# Patient Record
Sex: Male | Born: 1980 | Hispanic: Yes | Marital: Married | Smoking: Never smoker
Health system: Southern US, Community
[De-identification: ages and names within clinical notes are randomized; demographics above are authoritative.]

## PROBLEM LIST (undated history)

## (undated) DIAGNOSIS — N289 Disorder of kidney and ureter, unspecified: Secondary | ICD-10-CM

## (undated) HISTORY — PX: TONSILLECTOMY: SUR1361

---

## 2019-06-14 ENCOUNTER — Other Ambulatory Visit: Payer: Self-pay

## 2019-06-14 DIAGNOSIS — Z20822 Contact with and (suspected) exposure to covid-19: Secondary | ICD-10-CM

## 2019-06-21 ENCOUNTER — Telehealth (HOSPITAL_COMMUNITY): Payer: Self-pay | Admitting: Emergency Medicine

## 2019-06-21 NOTE — Telephone Encounter (Signed)
Received report from [place] with positive result for Austin Glover, pt states he was contacted by the testing center already about his positive covid results.

## 2020-02-09 ENCOUNTER — Ambulatory Visit: Payer: Self-pay | Attending: Internal Medicine

## 2020-02-09 DIAGNOSIS — Z23 Encounter for immunization: Secondary | ICD-10-CM

## 2020-02-09 NOTE — Progress Notes (Signed)
   Covid-19 Vaccination Clinic  Name:  Clearence Vitug    MRN: [id number] DOB: [birth year]  02/09/2020  Mr. Genia Hotter was observed post Covid-19 immunization for 15 minutes without incident. He was provided with Vaccine Information Sheet and instruction to access the V-Safe system.   Mr. Genia Hotter was instructed to call 911 with any severe reactions post vaccine: Marland Kitchen Difficulty breathing  . Swelling of face and throat  . A fast heartbeat  . A bad rash all over body  . Dizziness and weakness   Immunizations Administered    Name Date Dose VIS Date Route   Pfizer COVID-19 Vaccine 02/09/2020 10:29 AM 0.3 mL 11/01/2019 Intramuscular   Manufacturer: [employer], [employer]   Lot: [id number]   NDC: [address]-8

## 2020-03-01 ENCOUNTER — Ambulatory Visit: Payer: Self-pay | Attending: Internal Medicine

## 2020-03-01 DIAGNOSIS — Z23 Encounter for immunization: Secondary | ICD-10-CM

## 2020-03-01 NOTE — Progress Notes (Signed)
   Covid-19 Vaccination Clinic  Name:  Noe Goyer    MRN: [id number] DOB: 1981-03-11  03/01/2020  Mr. Genia Hotter was observed post Covid-19 immunization for 15 minutes without incident. He was provided with Vaccine Information Sheet and instruction to access the V-Safe system.   Mr. Genia Hotter was instructed to call 911 with any severe reactions post vaccine: Marland Kitchen Difficulty breathing  . Swelling of face and throat  . A fast heartbeat  . A bad rash all over body  . Dizziness and weakness   Immunizations Administered    Name Date Dose VIS Date Route   Pfizer COVID-19 Vaccine 03/01/2020 10:05 AM 0.3 mL 11/01/2019 Intramuscular   Manufacturer: [employer], [employer]   Lot: [id number]   NDC: [address]-8

## 2020-06-04 ENCOUNTER — Other Ambulatory Visit (INDEPENDENT_AMBULATORY_CARE_PROVIDER_SITE_OTHER): Payer: Self-pay | Admitting: Vascular Surgery

## 2020-06-04 ENCOUNTER — Observation Stay: Payer: Self-pay

## 2020-06-04 ENCOUNTER — Other Ambulatory Visit: Payer: Self-pay

## 2020-06-04 ENCOUNTER — Encounter
Admission: EM | Disposition: A | Discharge status: Home / Self Care | Payer: Self-pay | Source: Home / Self Care | Attending: Family Medicine

## 2020-06-04 ENCOUNTER — Inpatient Hospital Stay
Admission: EM | Admit: 2020-06-04 | Discharge: 2020-06-16 | DRG: 270 | Disposition: A | Discharge status: Home / Self Care | Payer: Self-pay | Attending: Family Medicine | Admitting: Family Medicine

## 2020-06-04 ENCOUNTER — Encounter: Payer: Self-pay | Admitting: Emergency Medicine

## 2020-06-04 ENCOUNTER — Emergency Department: Payer: Self-pay

## 2020-06-04 DIAGNOSIS — R52 Pain, unspecified: Secondary | ICD-10-CM

## 2020-06-04 DIAGNOSIS — Z8616 Personal history of COVID-19: Secondary | ICD-10-CM

## 2020-06-04 DIAGNOSIS — M79606 Pain in leg, unspecified: Secondary | ICD-10-CM | POA: Diagnosis present

## 2020-06-04 DIAGNOSIS — I998 Other disorder of circulatory system: Secondary | ICD-10-CM

## 2020-06-04 DIAGNOSIS — N50811 Right testicular pain: Secondary | ICD-10-CM | POA: Diagnosis not present

## 2020-06-04 DIAGNOSIS — N1330 Unspecified hydronephrosis: Secondary | ICD-10-CM | POA: Diagnosis present

## 2020-06-04 DIAGNOSIS — D6959 Other secondary thrombocytopenia: Secondary | ICD-10-CM | POA: Diagnosis present

## 2020-06-04 DIAGNOSIS — I82403 Acute embolism and thrombosis of unspecified deep veins of lower extremity, bilateral: Secondary | ICD-10-CM | POA: Diagnosis present

## 2020-06-04 DIAGNOSIS — R918 Other nonspecific abnormal finding of lung field: Secondary | ICD-10-CM

## 2020-06-04 DIAGNOSIS — Z6836 Body mass index (BMI) 36.0-36.9, adult: Secondary | ICD-10-CM

## 2020-06-04 DIAGNOSIS — N135 Crossing vessel and stricture of ureter without hydronephrosis: Secondary | ICD-10-CM

## 2020-06-04 DIAGNOSIS — N261 Atrophy of kidney (terminal): Secondary | ICD-10-CM | POA: Diagnosis present

## 2020-06-04 DIAGNOSIS — N179 Acute kidney failure, unspecified: Secondary | ICD-10-CM | POA: Diagnosis present

## 2020-06-04 DIAGNOSIS — I5021 Acute systolic (congestive) heart failure: Secondary | ICD-10-CM | POA: Diagnosis present

## 2020-06-04 DIAGNOSIS — I739 Peripheral vascular disease, unspecified: Secondary | ICD-10-CM

## 2020-06-04 DIAGNOSIS — I70229 Atherosclerosis of native arteries of extremities with rest pain, unspecified extremity: Secondary | ICD-10-CM

## 2020-06-04 DIAGNOSIS — E669 Obesity, unspecified: Secondary | ICD-10-CM | POA: Diagnosis present

## 2020-06-04 DIAGNOSIS — D62 Acute posthemorrhagic anemia: Secondary | ICD-10-CM | POA: Diagnosis not present

## 2020-06-04 DIAGNOSIS — D72829 Elevated white blood cell count, unspecified: Secondary | ICD-10-CM | POA: Diagnosis not present

## 2020-06-04 DIAGNOSIS — I2699 Other pulmonary embolism without acute cor pulmonale: Secondary | ICD-10-CM | POA: Diagnosis present

## 2020-06-04 DIAGNOSIS — Z833 Family history of diabetes mellitus: Secondary | ICD-10-CM

## 2020-06-04 DIAGNOSIS — I82493 Acute embolism and thrombosis of other specified deep vein of lower extremity, bilateral: Principal | ICD-10-CM | POA: Diagnosis present

## 2020-06-04 DIAGNOSIS — R000 Tachycardia, unspecified: Secondary | ICD-10-CM | POA: Diagnosis not present

## 2020-06-04 DIAGNOSIS — N131 Hydronephrosis with ureteral stricture, not elsewhere classified: Secondary | ICD-10-CM | POA: Diagnosis present

## 2020-06-04 HISTORY — PX: LOWER EXTREMITY ANGIOGRAPHY: CATH118251

## 2020-06-04 LAB — APTT: aPTT: 31 seconds (ref 24–36)

## 2020-06-04 LAB — COMPREHENSIVE METABOLIC PANEL
ALT: 33 U/L (ref 0–44)
AST: 32 U/L (ref 15–41)
Albumin: 4.5 g/dL (ref 3.5–5.0)
Alkaline Phosphatase: 90 U/L (ref 38–126)
Anion gap: 20 — ABNORMAL HIGH (ref 5–15)
BUN: 22 mg/dL — ABNORMAL HIGH (ref 6–20)
CO2: 10 mmol/L — ABNORMAL LOW (ref 22–32)
Calcium: 8.7 mg/dL — ABNORMAL LOW (ref 8.9–10.3)
Chloride: 107 mmol/L (ref 98–111)
Creatinine, Ser: 1.89 mg/dL — ABNORMAL HIGH (ref 0.61–1.24)
GFR calc Af Amer: 51 mL/min — ABNORMAL LOW (ref >60)
GFR calc non Af Amer: 44 mL/min — ABNORMAL LOW (ref >60)
Glucose, Bld: 259 mg/dL — ABNORMAL HIGH (ref 70–99)
Potassium: 4 mmol/L (ref 3.5–5.1)
Sodium: 137 mmol/L (ref 135–145)
Total Bilirubin: 1.2 mg/dL (ref 0.3–1.2)
Total Protein: 8.2 g/dL — ABNORMAL HIGH (ref 6.5–8.1)

## 2020-06-04 LAB — SARS CORONAVIRUS 2 BY RT PCR (HOSPITAL ORDER, PERFORMED IN ~~LOC~~ HOSPITAL LAB): SARS Coronavirus 2: NEGATIVE

## 2020-06-04 LAB — CBC WITH DIFFERENTIAL/PLATELET
Abs Immature Granulocytes: 0.21 10*3/uL — ABNORMAL HIGH (ref 0.00–0.07)
Basophils Absolute: 0.1 10*3/uL (ref 0.0–0.1)
Basophils Relative: 0 %
Eosinophils Absolute: 0.1 10*3/uL (ref 0.0–0.5)
Eosinophils Relative: 1 %
HCT: 47.4 % (ref 39.0–52.0)
Hemoglobin: 16 g/dL (ref 13.0–17.0)
Immature Granulocytes: 1 %
Lymphocytes Relative: 18 %
Lymphs Abs: 3.5 10*3/uL (ref 0.7–4.0)
MCH: 29.8 pg (ref 26.0–34.0)
MCHC: 33.8 g/dL (ref 30.0–36.0)
MCV: 88.3 fL (ref 80.0–100.0)
Monocytes Absolute: 1.1 10*3/uL — ABNORMAL HIGH (ref 0.1–1.0)
Monocytes Relative: 6 %
Neutro Abs: 14 10*3/uL — ABNORMAL HIGH (ref 1.7–7.7)
Neutrophils Relative %: 74 %
Platelets: 178 10*3/uL (ref 150–400)
RBC: 5.37 MIL/uL (ref 4.22–5.81)
RDW: 12.2 % (ref 11.5–15.5)
WBC: 18.9 10*3/uL — ABNORMAL HIGH (ref 4.0–10.5)
nRBC: 0 % (ref 0.0–0.2)

## 2020-06-04 LAB — HEPARIN LEVEL (UNFRACTIONATED): Heparin Unfractionated: 0.6 IU/mL (ref 0.30–0.70)

## 2020-06-04 LAB — C-REACTIVE PROTEIN: CRP: 0.9 mg/dL (ref <1.0)

## 2020-06-04 LAB — LACTIC ACID, PLASMA
Lactic Acid, Venous: 5.1 mmol/L (ref 0.5–1.9)
Lactic Acid, Venous: 1.8 mmol/L (ref 0.5–1.9)

## 2020-06-04 LAB — PROTIME-INR
INR: 1.2 (ref 0.8–1.2)
Prothrombin Time: 15.1 seconds (ref 11.4–15.2)

## 2020-06-04 LAB — CK: Total CK: 175 U/L (ref 49–397)

## 2020-06-04 LAB — GLUCOSE, CAPILLARY: Glucose-Capillary: 127 mg/dL — ABNORMAL HIGH (ref 70–99)

## 2020-06-04 IMAGING — CT CT ANGIO AOBIFEM WO/W CM
2 of 10 series · 11 of 46 positions shown, 15 images · IV contrast (APPLIED)
Comparison: None.

CLINICAL DATA: Acute back pain, bilateral leg pain, mottled lower
extremity

EXAM:
CT ANGIOGRAPHY OF ABDOMINAL AORTA WITH ILIOFEMORAL RUNOFF
TECHNIQUE: Multidetector CT imaging of the abdomen, pelvis and lower
extremities was performed using the standard protocol during bolus
administration of intravenous contrast. Multiplanar CT image
reconstructions and MIPs were obtained to evaluate the vascular
anatomy.
CONTRAST:  125mL OMNIPAQUE IOHEXOL 350 MG/ML SOLN

[Series 4: axial arterial upper · axial · arterial · 0.91mm/px · z∈[-700,-31]mm · 10 of 259 slices shown, 13 images]
[im 18/259  soft-tissue]
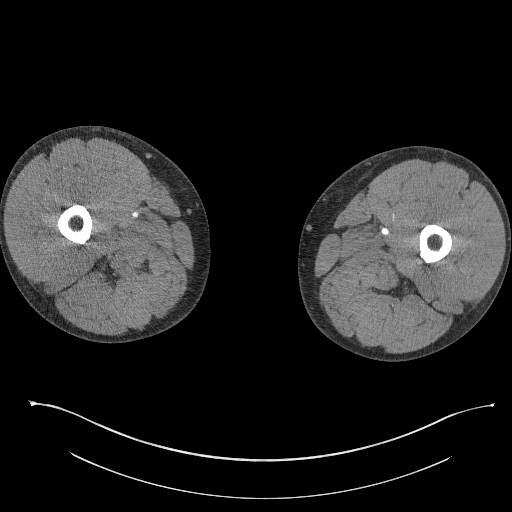
[im 18/259  bone]
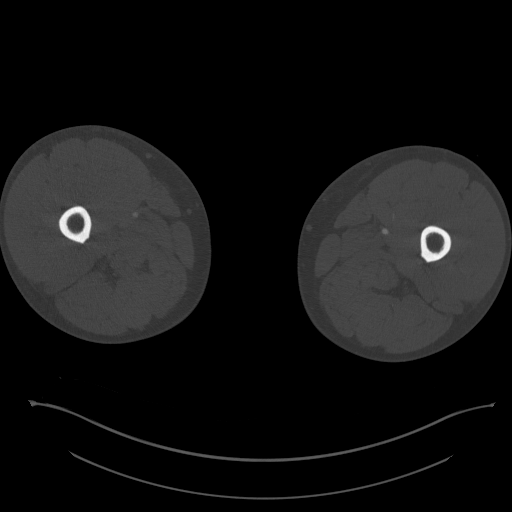
[im 52/259  soft-tissue]
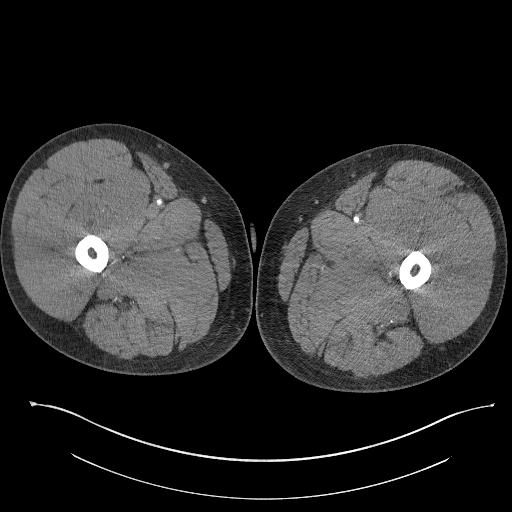
[im 87/259  soft-tissue]
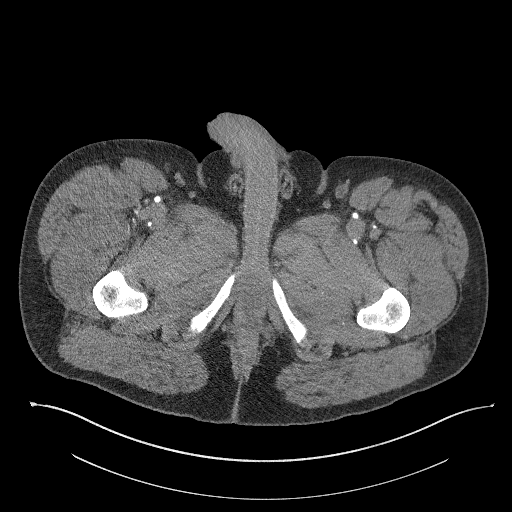
[im 121/259  soft-tissue]
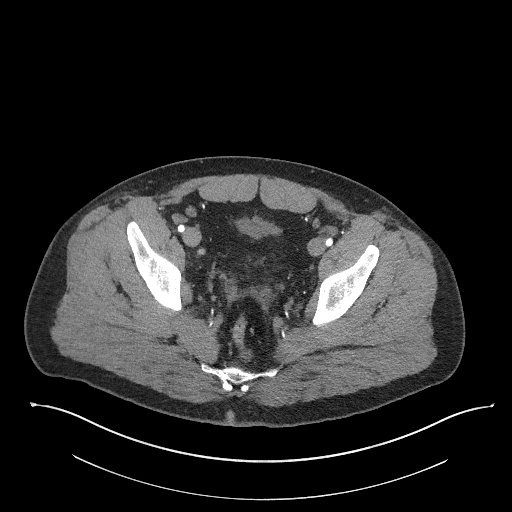
[im 138/259  soft-tissue]
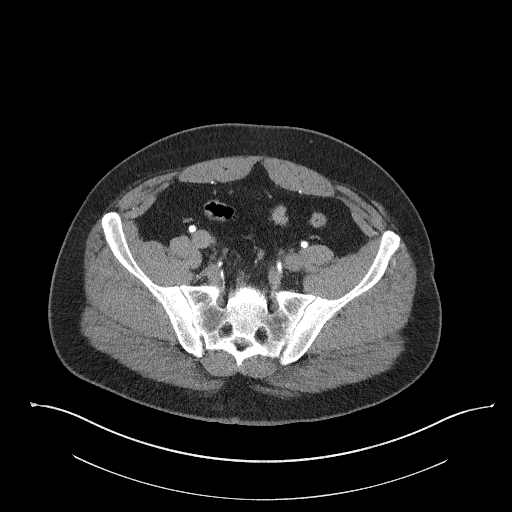
[im 173/259  soft-tissue]
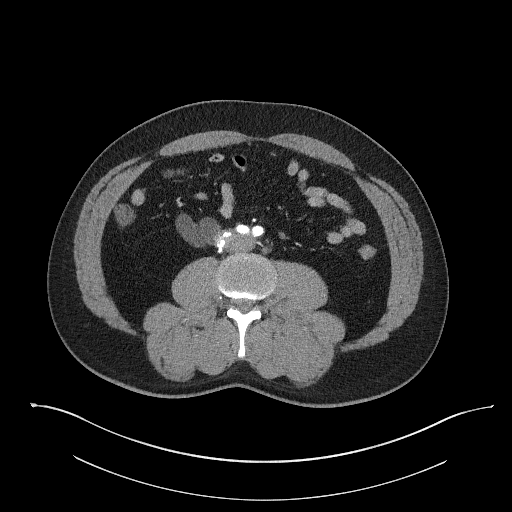
[im 190/259  lung]
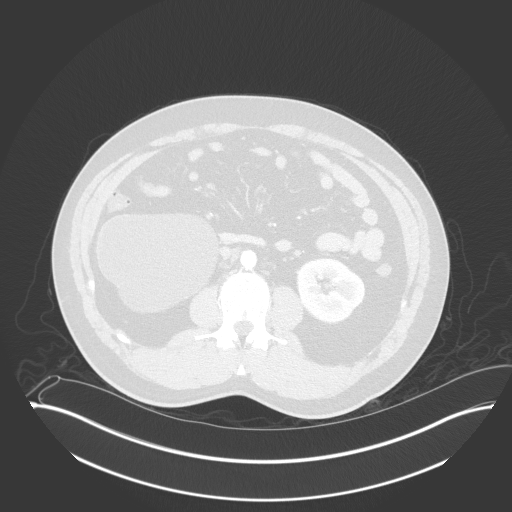
[im 207/259  soft-tissue]
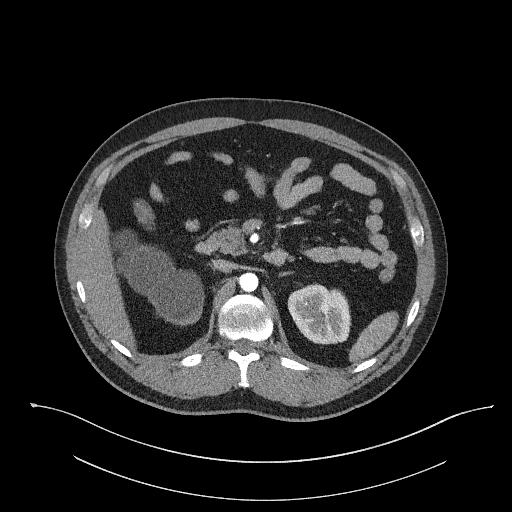
[im 207/259  lung]
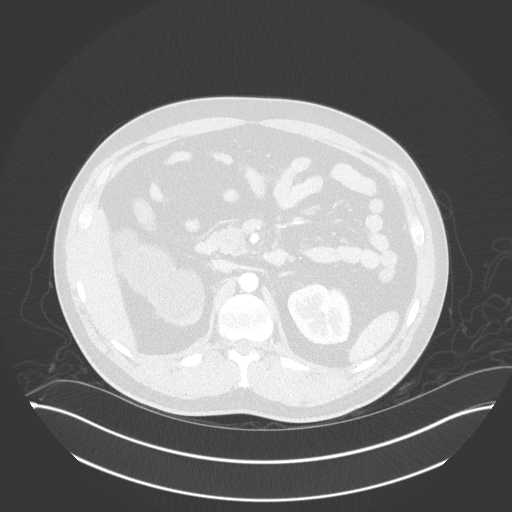
[im 224/259  lung]
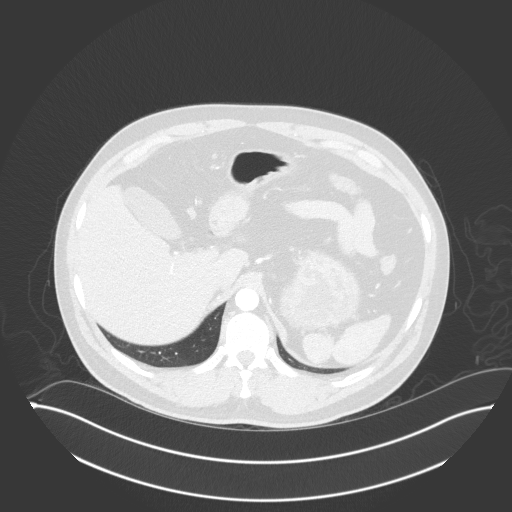
[im 241/259  soft-tissue]
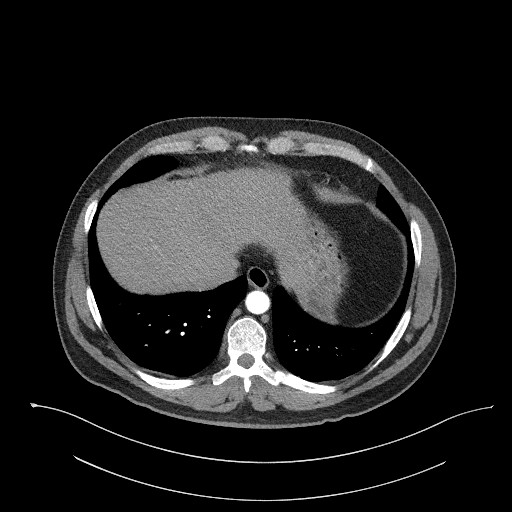
[im 241/259  lung]
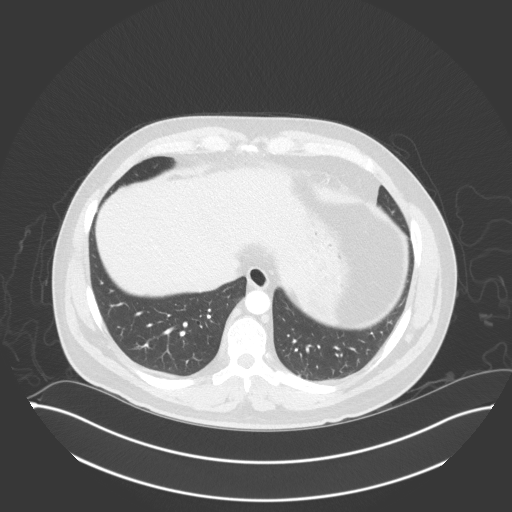

[Series 6: coronal upper · coronal · 0.92mm/px · 1 of 152 slices shown, 2 images]
[im 76/152  soft-tissue]
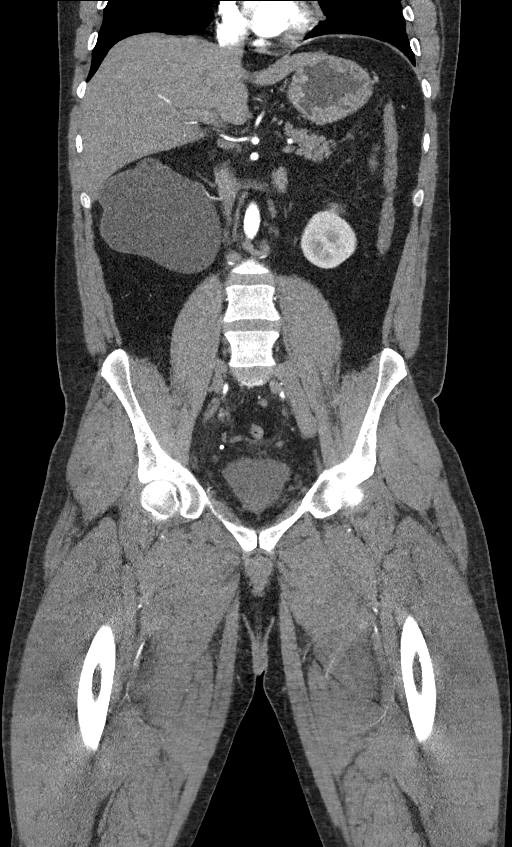
[im 76/152  bone]
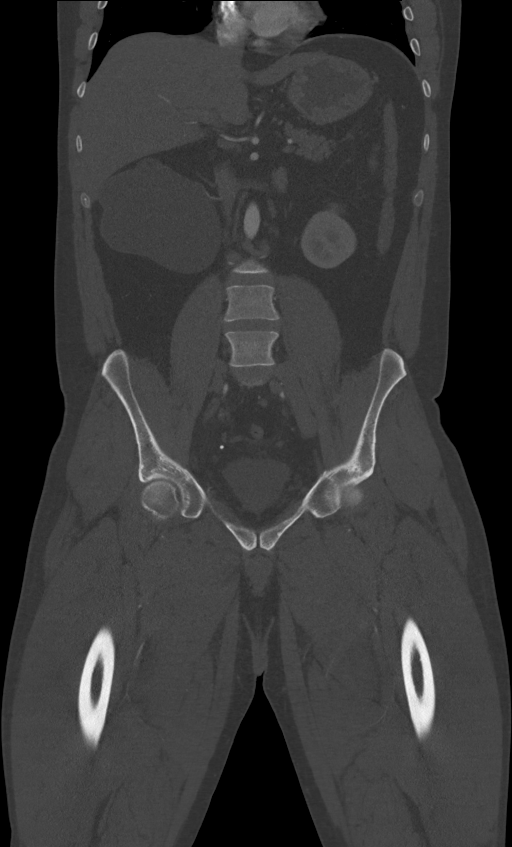

[11 of 46 positions shown; findings below may reference images not displayed]

FINDINGS: VASCULAR

Aorta: Normal caliber aorta without aneurysm, dissection, vasculitis
or significant stenosis.

Celiac: Patent without evidence of aneurysm, dissection, vasculitis
or significant stenosis.

SMA: Patent without evidence of aneurysm, dissection, vasculitis or
significant stenosis.

Renals: Single left renal artery, unremarkable. Single right renal
artery, diffusely diminutive in size.

IMA: Patent without evidence of aneurysm, dissection, vasculitis or
significant stenosis.

RIGHT Lower Extremity

Inflow: Common, internal and external iliac arteries are patent
without evidence of aneurysm, dissection, vasculitis or significant
stenosis.

Outflow: Common, superficial and profunda femoral arteries and the
proximal popliteal artery are patent without evidence of aneurysm,
dissection, vasculitis or significant stenosis. There is asymmetric
SFA opacification, left greater than right. There is incomplete
opacification of the distal right popliteal artery.

Runoff: No significant contrast opacification.

LEFT Lower Extremity

Inflow: Common, internal and external iliac arteries are patent
without evidence of aneurysm, dissection, vasculitis or significant
stenosis.

Outflow: Common, superficial and profunda femoral arteries and the
popliteal artery are patent without evidence of aneurysm,
dissection, vasculitis or significant stenosis.

Runoff: Patent 3 vessel runoff to the mid calf, incomplete
opacification distally presumably related to scan timing.

Veins: Dilated right left common iliac vein and iliocaval confluence
up to 3.2 cm diameter, with tapered narrowing of the IVC to 1.4 cm
in its infrarenal segment. Due to arterial scan timing, no venous
opacification to assess for thrombus.

Review of the MIP images confirms the above findings.

NON-VASCULAR

Lower chest: No pleural or pericardial effusion. Visualized lung
bases clear.

Hepatobiliary: No focal liver abnormality is seen. No gallstones,
gallbladder wall thickening, or biliary dilatation.

Pancreas: Unremarkable. No pancreatic ductal dilatation or
surrounding inflammatory changes.

Spleen: Normal in size.  Single small calcified granuloma.

Adrenals/Urinary Tract: Adrenal glands unremarkable. Left kidney
normal. There is massive right hydronephrosis and proximal
ureterectasis with advanced atrophy of the renal parenchyma, only a
thin rim of tissue evident. The right ureterectasis terminates at
the level of a right retroperitoneal soft tissue mass containing
coarse calcifications process abutting the right common iliac vein
and distal IVC. Distal right ureter decompressed. Urinary bladder
nondistended, mildly thick-walled. Thick-walled urachal remnant with
soft tissue nodularity containing 2 foci of calcification.

Stomach/Bowel: Stomach is partially distended by ingested fluid. The
small bowel is nondistended. normal appendix. The colon is
decompressed, unremarkable.

Lymphatic: Multiple prominent subcentimeter right external iliac,
bilateral common iliac, left para-aortic and aortocaval lymph nodes.
No mesenteric adenopathy.

Reproductive: Prostate is unremarkable.

Other: No ascites.  No free air.

Musculoskeletal: Soft tissue mass containing coarse calcifications
extending over a craniocaudal length of 7.2 cm, abutting right
common iliac vein and SAILAJA caval confluence, in corresponding to
transition point of proximal right ureterectasis. Left knee medial
compartment DJD. No fracture or worrisome bone lesion.
IMPRESSION: 1. No aortic dissection, occlusion or stenosis.
2. Asymmetric flow to lower extremities left greater than right,
limiting evaluation of distal right popliteal artery and runoff such
that acute embolus cannot be confidently excluded.
3. Dilated left common iliac vein and iliocaval confluence up to
cm diameter, with tapered narrowing of the IVC to 1.4 cm in its
infrarenal segment. Given the clinical history, bilateral lower
extremity venous ultrasound may be useful for further evaluation.
4. Right retroperitoneal soft tissue mass containing coarse
calcifications, abutting the right common iliac vein and distal IVC.
There is associated chronic obstructive right hydronephrosis and
proximal ureterectasis with advanced right renal atrophy, suggesting
chronic process. MR SAILAJA be useful for better soft tissue
characterization.
5. Thick-walled urachal remnant with soft tissue nodularity
containing 2 foci of calcification. Recommend urologic consultation.

## 2020-06-04 SURGERY — LOWER EXTREMITY ANGIOGRAPHY
Anesthesia: Moderate Sedation | Laterality: Right

## 2020-06-04 MED ORDER — FENTANYL CITRATE (PF) 100 MCG/2ML IJ SOLN
INTRAMUSCULAR | Status: AC
  Filled 2020-06-04: qty 4

## 2020-06-04 MED ORDER — OXYCODONE HCL 5 MG PO TABS
5.0000 mg | ORAL_TABLET | ORAL | Status: DC | PRN
  Administered 2020-06-04 – 2020-06-10 (×23): 10 mg via ORAL
  Filled 2020-06-04 (×23): qty 2

## 2020-06-04 MED ORDER — HYDROMORPHONE HCL 1 MG/ML IJ SOLN: 1.0000 mg | INTRAMUSCULAR | Status: DC | PRN

## 2020-06-04 MED ORDER — ASPIRIN EC 81 MG PO TBEC
81.0000 mg | DELAYED_RELEASE_TABLET | Freq: Every day | ORAL | Status: DC
  Administered 2020-06-05 – 2020-06-06 (×2): 81 mg via ORAL
  Filled 2020-06-04 (×2): qty 1

## 2020-06-04 MED ORDER — SODIUM CHLORIDE 0.9 % IV SOLN: INTRAVENOUS | Status: DC

## 2020-06-04 MED ORDER — HYDROMORPHONE HCL 1 MG/ML IJ SOLN
1.0000 mg | Freq: Once | INTRAMUSCULAR | Status: AC
  Administered 2020-06-04: 1 mg via INTRAVENOUS

## 2020-06-04 MED ORDER — HEPARIN (PORCINE) 25000 UT/250ML-% IV SOLN
1650.0000 [IU]/h | INTRAVENOUS | Status: DC
  Administered 2020-06-04: 1650 [IU]/h via INTRAVENOUS
  Filled 2020-06-04: qty 250

## 2020-06-04 MED ORDER — CEFAZOLIN SODIUM-DEXTROSE 1-4 GM/50ML-% IV SOLN
INTRAVENOUS | Status: AC
  Filled 2020-06-04: qty 50

## 2020-06-04 MED ORDER — FENTANYL CITRATE (PF) 100 MCG/2ML IJ SOLN
INTRAMUSCULAR | Status: DC | PRN
  Administered 2020-06-04: 50 ug via INTRAVENOUS

## 2020-06-04 MED ORDER — MIDAZOLAM HCL 5 MG/5ML IJ SOLN
INTRAMUSCULAR | Status: AC
  Filled 2020-06-04: qty 5

## 2020-06-04 MED ORDER — FENTANYL CITRATE (PF) 100 MCG/2ML IJ SOLN
INTRAMUSCULAR | Status: AC
  Administered 2020-06-04: 50 ug via INTRAVENOUS
  Filled 2020-06-04: qty 2

## 2020-06-04 MED ORDER — HEPARIN SODIUM (PORCINE) 1000 UNIT/ML IJ SOLN
INTRAMUSCULAR | Status: AC
  Filled 2020-06-04: qty 2

## 2020-06-04 MED ORDER — LACTATED RINGERS IV BOLUS
1000.0000 mL | Freq: Once | INTRAVENOUS | Status: AC
  Administered 2020-06-04: 1000 mL via INTRAVENOUS

## 2020-06-04 MED ORDER — HYDROMORPHONE HCL 1 MG/ML IJ SOLN
INTRAMUSCULAR | Status: AC
  Filled 2020-06-04: qty 1

## 2020-06-04 MED ORDER — CEFAZOLIN SODIUM-DEXTROSE 1-4 GM/50ML-% IV SOLN
1.0000 g | Freq: Once | INTRAVENOUS | Status: AC
  Administered 2020-06-04: 1 g via INTRAVENOUS

## 2020-06-04 MED ORDER — SODIUM CHLORIDE 0.9 % IV SOLN
2.0000 g | Freq: Once | INTRAVENOUS | Status: AC
  Administered 2020-06-04: 2 g via INTRAVENOUS
  Filled 2020-06-04 (×2): qty 2

## 2020-06-04 MED ORDER — METHYLPREDNISOLONE SODIUM SUCC 125 MG IJ SOLR: 125.0000 mg | Freq: Once | INTRAMUSCULAR | Status: DC | PRN

## 2020-06-04 MED ORDER — FAMOTIDINE 20 MG PO TABS: 40.0000 mg | ORAL_TABLET | Freq: Once | ORAL | Status: DC | PRN

## 2020-06-04 MED ORDER — ONDANSETRON HCL 4 MG/2ML IJ SOLN
4.0000 mg | Freq: Four times a day (QID) | INTRAMUSCULAR | Status: DC | PRN
  Administered 2020-06-04: 4 mg via INTRAVENOUS

## 2020-06-04 MED ORDER — MORPHINE SULFATE (PF) 2 MG/ML IV SOLN
2.0000 mg | INTRAVENOUS | Status: DC | PRN
  Administered 2020-06-05 (×3): 2 mg via INTRAVENOUS
  Filled 2020-06-04 (×3): qty 1

## 2020-06-04 MED ORDER — HYDROMORPHONE HCL 1 MG/ML IJ SOLN
1.0000 mg | Freq: Once | INTRAMUSCULAR | Status: AC
  Administered 2020-06-04: 1 mg via INTRAVENOUS
  Filled 2020-06-04: qty 1

## 2020-06-04 MED ORDER — FENTANYL CITRATE (PF) 100 MCG/2ML IJ SOLN: 50.0000 ug | Freq: Once | INTRAMUSCULAR | Status: AC

## 2020-06-04 MED ORDER — HYDROMORPHONE HCL 1 MG/ML IJ SOLN: 1.0000 mg | Freq: Once | INTRAMUSCULAR | Status: DC | PRN

## 2020-06-04 MED ORDER — IOHEXOL 350 MG/ML SOLN
125.0000 mL | Freq: Once | INTRAVENOUS | Status: AC | PRN
  Administered 2020-06-04: 125 mL via INTRAVENOUS
  Filled 2020-06-04: qty 125

## 2020-06-04 MED ORDER — HYDROMORPHONE HCL 1 MG/ML IJ SOLN
1.0000 mg | INTRAMUSCULAR | Status: DC | PRN
  Administered 2020-06-04 (×2): 1 mg via INTRAVENOUS
  Filled 2020-06-04 (×2): qty 1

## 2020-06-04 MED ORDER — INSULIN ASPART 100 UNIT/ML ~~LOC~~ SOLN
6.0000 [IU] | Freq: Once | SUBCUTANEOUS | Status: AC
  Administered 2020-06-04: 6 [IU] via INTRAVENOUS
  Filled 2020-06-04: qty 1

## 2020-06-04 MED ORDER — SODIUM CHLORIDE 0.9 % IV BOLUS
1000.0000 mL | Freq: Once | INTRAVENOUS | Status: AC
  Administered 2020-06-04: 1000 mL via INTRAVENOUS

## 2020-06-04 MED ORDER — DIPHENHYDRAMINE HCL 50 MG/ML IJ SOLN: 50.0000 mg | Freq: Once | INTRAMUSCULAR | Status: DC | PRN

## 2020-06-04 MED ORDER — VANCOMYCIN HCL IN DEXTROSE 1-5 GM/200ML-% IV SOLN
1000.0000 mg | Freq: Once | INTRAVENOUS | Status: AC
  Administered 2020-06-04: 1000 mg via INTRAVENOUS
  Filled 2020-06-04: qty 200

## 2020-06-04 MED ORDER — MIDAZOLAM HCL 2 MG/ML PO SYRP: 8.0000 mg | ORAL_SOLUTION | Freq: Once | ORAL | Status: DC | PRN

## 2020-06-04 MED ORDER — ONDANSETRON HCL 4 MG/2ML IJ SOLN
INTRAMUSCULAR | Status: AC
  Filled 2020-06-04: qty 2

## 2020-06-04 MED ORDER — METRONIDAZOLE IN NACL 5-0.79 MG/ML-% IV SOLN
500.0000 mg | Freq: Once | INTRAVENOUS | Status: AC
  Administered 2020-06-04: 500 mg via INTRAVENOUS
  Filled 2020-06-04: qty 100

## 2020-06-04 MED ORDER — MIDAZOLAM HCL 2 MG/2ML IJ SOLN
INTRAMUSCULAR | Status: DC | PRN
  Administered 2020-06-04: 2 mg via INTRAVENOUS

## 2020-06-04 MED ORDER — HEPARIN BOLUS VIA INFUSION
5800.0000 [IU] | Freq: Once | INTRAVENOUS | Status: AC
  Administered 2020-06-04: 5800 [IU] via INTRAVENOUS
  Filled 2020-06-04: qty 5800

## 2020-06-04 MED ORDER — SODIUM CHLORIDE 0.9 % IV SOLN: Freq: Once | INTRAVENOUS | Status: AC

## 2020-06-04 SURGICAL SUPPLY — 6 items
CATH ANGIO 5F PIGTAIL 65CM (CATHETERS) ×3 IMPLANT
DEVICE STARCLOSE SE CLOSURE (Vascular Products) ×3 IMPLANT
PACK ANGIOGRAPHY (CUSTOM PROCEDURE TRAY) ×3 IMPLANT
SHEATH BRITE TIP 5FRX11 (SHEATH) ×3 IMPLANT
TUBING CONTRAST HIGH PRESS 72 (TUBING) ×3 IMPLANT
WIRE J 3MM .035X145CM (WIRE) ×3 IMPLANT

## 2020-06-04 NOTE — Progress Notes (Signed)
ANTICOAGULATION CONSULT NOTE - Initial Consult  Pharmacy Consult for Heparin Drip Indication: Limb Ischemia  No Known Allergies  Patient Measurements: Height: 5\' 11"  (180.3 cm) Weight: 104.3 kg (230 lb) IBW/kg (Calculated) : 75.3 Heparin Dosing Weight: 97.2 kg  Vital Signs: Temp: 97.5 F (36.4 C) (07/15 0709) Temp Source: Axillary (07/15 0709) BP: 150/104 (07/15 1100) Pulse Rate: 126 (07/15 1120)  Labs: Recent Labs    06/04/20 0756 06/04/20 1018  HGB 16.0  --   HCT 47.4  --   PLT 178  --   APTT  --  31  LABPROT  --  15.1  INR  --  1.2  CREATININE 1.89*  --   CKTOTAL 175  --     Estimated Creatinine Clearance: 65.8 mL/min (A) (by C-G formula based on SCr of 1.89 mg/dL (H)).   Medical History: History reviewed. No pertinent past medical history.  Assessment: Patient is a 39yo male admitted with limb ischemia. Pharmacy consulted for Heparin dosing. No prior anticoagulants noted. Baseline labs within range.  Goal of Therapy:  Heparin level 0.3-0.7 units/ml Monitor platelets by anticoagulation protocol: Yes   Plan:  Give 5800 units bolus x 1 Start heparin infusion at 1650 units/hr Check anti-Xa level in 6 hours and daily while on heparin Continue to monitor H&H and platelets  39yo, PharmD, BCPS 06/04/2020 11:33 AM

## 2020-06-04 NOTE — ED Notes (Signed)
Ed out of stock of maxipime. Called pharmacy to send med

## 2020-06-04 NOTE — Progress Notes (Signed)
ANTICOAGULATION CONSULT NOTE - Initial Consult  Pharmacy Consult for Heparin Drip Indication: Limb Ischemia  No Known Allergies  Patient Measurements: Height: 5\' 11"  (180.3 cm) Weight: 104.3 kg (230 lb) IBW/kg (Calculated) : 75.3 Heparin Dosing Weight: 97.2 kg  Vital Signs: Temp: 97.6 F (36.4 C) (07/15 2017) Temp Source: Oral (07/15 2017) BP: 134/98 (07/15 2017) Pulse Rate: 93 (07/15 2017)  Labs: Recent Labs    06/04/20 0756 06/04/20 1018 06/04/20 1820  HGB 16.0  --   --   HCT 47.4  --   --   PLT 178  --   --   APTT  --  31  --   LABPROT  --  15.1  --   INR  --  1.2  --   HEPARINUNFRC  --   --  0.60  CREATININE 1.89*  --   --   CKTOTAL 175  --   --     Estimated Creatinine Clearance: 65.8 mL/min (A) (by C-G formula based on SCr of 1.89 mg/dL (H)).   Medical History: History reviewed. No pertinent past medical history.  Assessment: Patient is a 39yo male admitted with limb ischemia. Pharmacy consulted for Heparin dosing. No prior anticoagulants noted. Baseline labs within range.  Goal of Therapy:  Heparin level 0.3-0.7 units/ml Monitor platelets by anticoagulation protocol: Yes   Plan:  07/15 @ 1900 patient was called as a rapid response d/t bleeding from femoral site. Heparin drip stopped, will continue to monitor and f/u.  8/15, PharmD, BCPS Clinical Pharmacist 06/04/2020 11:58 PM

## 2020-06-04 NOTE — ED Notes (Signed)
Pt assisted to restroom.  

## 2020-06-04 NOTE — ED Provider Notes (Addendum)
[facility] Emergency Department Provider Note  ____________________________________________   None    (approximate)  I have reviewed the triage vital signs and the nursing notes.   HISTORY  Chief Complaint Back Pain   HPI Austin Glover is a 39 y.o. male presents to the ED via EMS with complaint of low back and bilateral leg pain that started yesterday.  Patient states that it was worse this morning.  He states that it was worse when he tried to urinate this morning.  Patient denies any injury to his back or extensive exercising.  Wife states that he danced for 25 minutes once.  Wife states that he has had some difficulties for the last 3 days however patient states that his legs began hurting yesterday.  He denies any health problems, fever, chills, nausea or vomiting.  Patient denies any injury to his back or previous problems with his back.  Patient states that yesterday he drank approximately 5 glasses of water.  He initially said that his urine was dark yellow this morning however later he states that it was the color of "tea".  He states that he has been in bed for the last 3 days and wife agrees.  He states he "just did not feel good".  He denies any cough and only states that his legs and back hurt.  He rates his pain as a 10/10.         History reviewed. No pertinent past medical history.  There are no problems to display for this patient.   Past Surgical History:  Procedure Laterality Date  . TONSILLECTOMY      Prior to Admission medications   Not on File    Allergies Patient has no known allergies.  History reviewed. No pertinent family history.  Social History Social History   Tobacco Use  . Smoking status: Never Smoker  . Smokeless tobacco: Never Used  Vaping Use  . Vaping Use: Never used  Substance Use Topics  . Alcohol use: Not on file    Comment: occasional  . Drug use: Never    Review of Systems Constitutional: No  fever/chills Eyes: No visual changes. ENT: No sore throat. Cardiovascular: Denies chest pain. Respiratory: Denies shortness of breath.  Negative for cough. Gastrointestinal: No abdominal pain.  No nausea, no vomiting.  No diarrhea. . Genitourinary: Negative for dysuria. Musculoskeletal: Positive back pain, positive bilateral leg pain. Skin: Negative for rash. Neurological: Negative for headaches, focal weakness or numbness. ____________________________________________   PHYSICAL EXAM:  VITAL SIGNS: ED Triage Vitals  Enc Vitals Group     BP 06/04/20 0709 (!) 112/93     Pulse Rate 06/04/20 0709 (!) 114     Resp 06/04/20 0709 20     Temp 06/04/20 0709 (!) 97.5 F (36.4 C)     Temp Source 06/04/20 0709 Axillary     SpO2 06/04/20 0700 99 %     Weight 06/04/20 0710 230 lb (104.3 kg)     Height 06/04/20 0710 5\' 11"  (1.803 m)     Head Circumference --      Peak Flow --      Pain Score 06/04/20 0709 10     Pain Loc --      Pain Edu? --      Excl. in GC? --     Constitutional: Alert and oriented. Well appearing and in no acute distress. Eyes: Conjunctivae are normal.  Head: Atraumatic. Neck: No stridor.   Cardiovascular: Normal rate,  regular rhythm. Grossly normal heart sounds.  Good peripheral circulation. Respiratory: Normal respiratory effort.  No retractions. Lungs CTAB. Gastrointestinal: Soft and nontender. No distention.  Musculoskeletal: Patient complains of pain at the waistline but is unable to sit due to pain at this time.  We will reevaluate as soon as he has had some pain medication.  Bilateral lower extremities are markedly tender to touch, firm, no pitting edema.  Extremities are cool to touch and discolored to a slight purple mottling appearance.  No abrasions or evidence of injury noted to lower extremities.  Motor sensory function intact distally.  Pulses are present bilaterally but faint DT and PT.   Neurologic:  Normal speech and language. No gross focal neurologic  deficits are appreciated.  Skin:  Skin is warm, dry and intact.  Psychiatric: Mood and affect are normal. Speech and behavior are normal.  ____________________________________________   LABS (all labs ordered are listed, but only abnormal results are displayed)  Labs Reviewed  COMPREHENSIVE METABOLIC PANEL - Abnormal; Notable for the following components:      Result Value   CO2 10 (*)    Glucose, Bld 259 (*)    BUN 22 (*)    Creatinine, Ser 1.89 (*)    Calcium 8.7 (*)    Total Protein 8.2 (*)    GFR calc non Af Amer 44 (*)    GFR calc Af Amer 51 (*)    Anion gap 20 (*)    All other components within normal limits  CBC WITH DIFFERENTIAL/PLATELET - Abnormal; Notable for the following components:   WBC 18.9 (*)    Neutro Abs 14.0 (*)    Monocytes Absolute 1.1 (*)    Abs Immature Granulocytes 0.21 (*)    All other components within normal limits  LACTIC ACID, PLASMA - Abnormal; Notable for the following components:   Lactic Acid, Venous 5.1 (*)    All other components within normal limits  SARS CORONAVIRUS 2 BY RT PCR (HOSPITAL ORDER, PERFORMED IN [facility])  CULTURE, BLOOD (ROUTINE X 2)  CULTURE, BLOOD (ROUTINE X 2)  CK  URINALYSIS, COMPLETE (UACMP) WITH MICROSCOPIC  C-REACTIVE PROTEIN  APTT  PROTIME-INR   ____________________________________________  PROCEDURES  Procedure(s) performed (including Critical Care):  Procedures   ____________________________________________   INITIAL IMPRESSION / ASSESSMENT AND PLAN / ED COURSE  39 year old male presents to the ED with complaint of low back pain with bilateral lower extremity pain without history of injury.  Patient was markedly tender to palpation bilateral legs with the right being greater than the left.  Both extremities appear to be swollen and initially a red discoloration that began turning more purple/mottled.  Extremity was cool to touch.  CK was not elevated.  Dr. Roxan Hockey was asked to come back  and see the patient as it did not look like rhabdomyolysis from the CK.  Patient was given fentanyl 50 mg IV and later Dilaudid 1 mg IV which did not help his pain when asked.  Patient was sent to CT and was moved to major room #3 immediately after CT.     Clinical Course as of Jun 04 1134  Thu Jun 04, 2020  1517 Was notified by PA Levada Schilling patient's presentation.  Evaluate patient at bedside.  Has pain to bilateral lower extremities right greater than left.  Very difficult to palpate pulses having new onset back pain.  No recent trauma.  His lower extremities are mottled.  Initial blood work shows evidence of AKI  and metabolic acidosis hyperglycemia.  May be component of DKA but given his presentation will give aggressive IV hydration.  His CK is normal.  No history to explain or suggest compartment syndrome.  Have a lower suspicion for rhabdo but do feel he needs CTA to exclude aortic emergency.  Unfortunately have one CT available as other is being used by procedure.   [PR]  0948 Unable to palpate DP or PT pulses.  Doppler signals are absent.   [PR]  [phone] Discussed case in consultation with Dr. Wyn Quaker vascular surgery who agrees with plan for heparinization and plan for urgent angiography.  There is a delay to angiography 2/2 maintenance on the machine.  All referral centers are currently at capacity as well therefore will keep here on heparin and plan for angio as soon as possible.   [PR]    Clinical Course User Index [PR] Willy Eddy, MD     ____________________________________________   FINAL CLINICAL IMPRESSION(S) / ED DIAGNOSES  Final diagnoses:  Critical lower limb ischemia     ED Discharge Orders    None       Note:  This document was prepared using Dragon voice recognition software and may include unintentional dictation errors.    Tommi Rumps, PA-C 06/04/20 1007    Tommi Rumps, PA-C 06/04/20 1136    Willy Eddy, MD 06/04/20 1530

## 2020-06-04 NOTE — ED Triage Notes (Signed)
Pt via ems from home with back and bilateral leg pain that started yesterday. Pt states it got worse when he tried to urinate, denies difficulty urinating or other symptoms. Pt alert & oriented, moaning in triage.

## 2020-06-04 NOTE — Op Note (Signed)
[facility]  Percutaneous Study/Intervention Procedural Note   Date of Surgery: 06/04/2020  Surgeon(s):Shenoa Hattabaugh    Assistants:none  Pre-operative Diagnosis: PAD with CT scan suggesting occlusion of both popliteal arteries and no flow seen distally  Post-operative diagnosis:  Same  Procedure(s) Performed:             1.  Ultrasound guidance for vascular access left femoral artery             2.  Catheter placement into right SFA from left femoral approach             3.  Aortogram and selective bilateral lower extremity angiogram             4.  StarClose closure device left femoral artery  EBL: 5 cc  Contrast: 80 cc  Fluoro Time: 5.5 minutes  Moderate Conscious Sedation Time: approximately 20 minutes using 2 mg of Versed and 50 mcg of Fentanyl              Indications:  Patient is a 39 y.o.male with leg pain bilaterally. The patient has a CT scan which suggested occlusions of both popliteal arteries and no flow seen distally. The patient is brought in for angiography for further evaluation and potential treatment.  Due to the limb threatening nature of the situation, angiogram was performed for attempted limb salvage. The patient is aware that if the procedure fails, amputation would be expected.  The patient also understands that even with successful revascularization, amputation may still be required due to the severity of the situation.  Risks and benefits are discussed and informed consent is obtained.   Procedure:  The patient was identified and appropriate procedural time out was performed.  The patient was then placed supine on the table and prepped and draped in the usual sterile fashion. Moderate conscious sedation was administered during a face to face encounter with the patient throughout the procedure with my supervision of the RN administering medicines and monitoring the patient's vital signs, pulse oximetry, telemetry and mental status throughout  from the start of the procedure until the patient was taken to the recovery room. Ultrasound was used to evaluate the left common femoral artery.  It was patent .  A digital ultrasound image was acquired.  A Seldinger needle was used to access the left common femoral artery under direct ultrasound guidance and a permanent image was performed.  A 0.035 J wire was advanced without resistance and a 5Fr sheath was placed.  Pigtail catheter was placed into the aorta and an AP aortogram was performed. The left renal artery was widely patent, the aorta and iliac arteries were widely patent without significant stenosis.  No right renal artery was seen. I then crossed the aortic bifurcation and advanced to the right femoral head.  Circulation time was very slow, so I advanced the pigtail catheter into the mid SFA to opacify distally.  Selective right lower extremity angiogram was then performed. This demonstrated no significant stenosis or atherosclerosis in the right common femoral artery, profunda femoris artery, superficial femoral artery, or popliteal artery.  There is a normal tibial trifurcation and although the circulation time is extremely slow, there appeared to be two-vessel runoff into the foot.  At this point, no ischemia was seen in the right leg.  I remove the pigtail catheter and replaced the J-wire.  Imaging of the left leg was then performed through the left femoral sheath for concern on that side as well.This  demonstrated no significant stenosis or atherosclerosis in the left common femoral artery, profunda femoris artery, superficial femoral artery, or popliteal artery.  There is a normal tibial trifurcation.  Circulation is quite slow, but there appeared to be three-vessel runoff distally.  I elected to terminate the procedure. The sheath was removed and StarClose closure device was deployed in the left femoral artery with excellent hemostatic result. The patient was taken to the recovery room in stable  condition having tolerated the procedure well.  Findings:               Aortogram:  The left renal artery was widely patent, the aorta and iliac arteries were widely patent without significant stenosis.  No right renal artery was seen.             Right lower Extremity:  This demonstrated no significant stenosis or atherosclerosis in the right common femoral artery, profunda femoris artery, superficial femoral artery, or popliteal artery.  There is a normal tibial trifurcation and although the circulation time is extremely slow, there appeared to be two-vessel runoff into the foot  Left lower extremity: This demonstrated no significant stenosis or atherosclerosis in the left common femoral artery, profunda femoris artery, superficial femoral artery, or popliteal artery.  There is a normal tibial trifurcation.  Circulation is quite slow, but there appeared to be three-vessel runoff distally.   Disposition: Patient was taken to the recovery room in stable condition having tolerated the procedure well.  Complications: None  Festus Barren 06/04/2020 5:59 PM   This note was created with Dragon Medical transcription system. Any errors in dictation are purely unintentional.

## 2020-06-04 NOTE — ED Notes (Signed)
This RN wrapped both pt's IV in coban to secure IV access is not lost

## 2020-06-04 NOTE — Significant Event (Signed)
Rapid Response Event Note  Overview: Time Called: 1859 Arrival Time: 1901 Event Type: Other (Comment) (uncontrolled bleeding)  Initial Focused Assessment: Rapid response RN arrived in patient's room with patient lying flat in bed with Olivia RN holding pressure at left femoral and sheets half under patient. Patient had angiogram today and had bleeding at femoral site on 2C. Olivia RN states that she had been holding pressure for 30 minutes and blood still coming from site. Dr. Wyn Quaker on the phone with the patients primary day shift nurse. Per Dr. Driscilla Grammes instructions pressure held above site to stop blood flow long enough to place PAD. Vital signs stable with BP 122/86 MAP 99, HR 108 ST, RR 20 and unlabored, oxygen saturations 99% on room air. Patient alert and oriented.   Interventions: Assisted holding pressure above the site while [place] RN placed PAD. While slight amount of old blood under PAD, no new blood observed by multiple RNs after PAD placed. No new hemoglobin ordered per phone call from Dr. Wyn Quaker.  Plan of Care (if not transferred): Patient to remain on 2C for now. Patient's RN instructed to recall rapid response if needed overnight.  Event Summary: Name of Physician Notified: Dr. Wyn Quaker at 1900    at    Outcome: Stayed in room and stabalized  Event End Time: 1915  Christell Constant [facility] [place]

## 2020-06-04 NOTE — Progress Notes (Signed)
Ch arrived at room in response to RR. Ch checked for it any family was around, and RN said Pt's wife was in the room while they were working on him. Another RN said that the wife is fine and would not require chaplain support at this time. RN will page Ch if support is needed.

## 2020-06-04 NOTE — Progress Notes (Signed)
PHARMACY -  BRIEF ANTIBIOTIC NOTE   Pharmacy has received consult(s) for Vancomycin and Cefepime from an ED provider.  The patient's profile has been reviewed for ht/wt/allergies/indication/available labs.    One time order(s) placed for Vancomycin and Cefepime by ED provider  Further antibiotics/pharmacy consults should be ordered by admitting physician if indicated.                       Thank you, Foye Deer 06/04/2020  9:57 AM

## 2020-06-04 NOTE — ED Notes (Signed)
Date and time results received: 06/04/20 9:23 AM  (use smartphrase ".now" to insert current time)  Test: Lactic Acid Critical Value: 5.1  Name of Provider Notified: Dr. Roxan Hockey   Orders Received? Or Actions Taken?: No new orders at this time

## 2020-06-04 NOTE — Consult Note (Signed)
[facility] Admission History & Physical  MRN : [id number]  Deetta PerlaMarco Vargas is a 39 y.o. (Apr 06, 1981) male who presents with chief complaint of  Chief Complaint  Patient presents with  . Back Pain   History of Present Illness:  Patient is a 39 year old male with no significant past medical history who presented to the [facility] emergency department via EMS complaining of bilateral leg pain.  Endorses history of bilateral lower extremity discomfort which started Monday.  Patient states he had attended a party over the weekend where he was active and dancing without issue.  Patient noted bilateral lower extremity discomfort started Monday for which he treated with ibuprofen.  Discomfort had resolved as of Tuesday however returned Wednesday and considerably more painful which prompted him to seek medical attention emergency department.  Patient denies any history of claudication-like symptoms, rest pain or wound formation to the bilateral legs.  Patient denies any fever, nausea vomiting.  Patient denies any shortness of breath or chest pain.  Patient denies experiencing any discomfort like this in the past.  Patient was admitted and started on heparin drip.  Current Facility-Administered Medications  Medication Dose Route Frequency Provider Last Rate Last Admin  . 0.9 %  sodium chloride infusion   Intravenous Continuous Lashona Schaaf A, PA-C      . fentaNYL (SUBLIMAZE) 100 MCG/2ML injection           . heparin ADULT infusion 100 units/mL (25000 units/23150mL sodium chloride 0.45%)  1,650 Units/hr Intravenous Continuous Willy Eddyobinson, Patrick, MD 16.5 mL/hr at 06/04/20 1116 1,650 Units/hr at 06/04/20 1116  . heparin sodium (porcine) 1000 UNIT/ML injection           . HYDROmorphone (DILAUDID) injection 1 mg  1 mg Intravenous Q4H PRN Aaryanna Hyden A, PA-C      . midazolam (VERSED) 5 MG/5ML injection           . oxyCODONE (Oxy IR/ROXICODONE)  immediate release tablet 5-10 mg  5-10 mg Oral Q4H PRN Nayelli Inglis A, PA-C       No current outpatient medications on file.   History reviewed. No pertinent past medical history.  Past Surgical History:  Procedure Laterality Date  . TONSILLECTOMY     Social History Social History   Tobacco Use  . Smoking status: Never Smoker  . Smokeless tobacco: Never Used  Vaping Use  . Vaping Use: Never used  Substance Use Topics  . Alcohol use: Not on file    Comment: occasional  . Drug use: Never   Family History History reviewed. No pertinent family history.  Denies family history of peripheral artery disease, renal disease or bleeding/clotting disorders.  No Known Allergies  REVIEW OF SYSTEMS (Negative unless checked)  Constitutional: [] Weight loss  [] Fever  [] Chills Cardiac: [] Chest pain   [] Chest pressure   [] Palpitations   [] Shortness of breath when laying flat   [] Shortness of breath at rest   [] Shortness of breath with exertion. Vascular:  [x] Pain in legs with walking   [x] Pain in legs at rest   [x] Pain in legs when laying flat   [] Claudication   [] Pain in feet when walking  [] Pain in feet at rest  [] Pain in feet when laying flat   [] History of DVT   [] Phlebitis   [] Swelling in legs   [] Varicose veins   [] Non-healing ulcers Pulmonary:   [] Uses home oxygen   [] Productive cough   [] Hemoptysis   [] Wheeze  [] COPD   [] Asthma Neurologic:  []   Dizziness  [] Blackouts   [] Seizures   [] History of stroke   [] History of TIA  [] Aphasia   [] Temporary blindness   [] Dysphagia   [] Weakness or numbness in arms   [] Weakness or numbness in legs Musculoskeletal:  [] Arthritis   [] Joint swelling   [] Joint pain   [] Low back pain Hematologic:  [] Easy bruising  [] Easy bleeding   [] Hypercoagulable state   [] Anemic  [] Hepatitis Gastrointestinal:  [] Blood in stool   [] Vomiting blood  [] Gastroesophageal reflux/heartburn   [] Difficulty swallowing. Genitourinary:  [] Chronic kidney disease   [] Difficult  urination  [] Frequent urination  [] Burning with urination   [] Blood in urine Skin:  [] Rashes   [] Ulcers   [] Wounds Psychological:  [] History of anxiety   []  History of major depression.  Physical Examination  Vitals:   06/04/20 1100 06/04/20 1120 06/04/20 1300 06/04/20 1318  BP: (!) 150/104  132/86   Pulse: (!) 125 (!) 126 (!) 109 (!) 120  Resp: 16 (!) 26 (!) 25 (!) 27  Temp:      TempSrc:      SpO2: 99% 99% 96% 95%  Weight:      Height:       Body mass index is 32.08 kg/m. Gen: WD/WN, NAD Head: [place]/AT, No temporalis wasting.  Ear/Nose/Throat: Hearing grossly intact, nares w/o erythema or drainage, oropharynx w/o Erythema/Exudate, Eyes: Sclera non-icteric, conjunctiva clear Neck: Supple, no nuchal rigidity.  No JVD.  Pulmonary:  Good air movement, no increased work of respiration or use of accessory muscles  Cardiac: RRR, normal S1, S2, no Murmurs, rubs or gallops. Vascular:  Vessel Right Left  Radial Palpable Palpable  Ulnar Palpable Palpable  Brachial Palpable Palpable  Carotid Palpable, without bruit Palpable, without bruit  Aorta Not palpable N/A  Femoral Palpable Palpable  Popliteal Non-Palpable Non-Palpable  PT Non-Palpable Non-Palpable  DP Non-Palpable Non-Palpable   Gastrointestinal: soft, non-tender/non-distended. No guarding/reflex. No masses, surgical incisions, or scars. Musculoskeletal: M/S 5/5 throughout.  No deformity or atrophy.  Minimal edema Neurologic: Sensation grossly intact in extremities.  Symmetrical.  Speech is fluent. Motor exam as listed above. Psychiatric: Judgment intact, Mood & affect appropriate for pt's clinical situation. Dermatologic: No rashes or ulcers noted.  No cellulitis or open wounds. Lymph: No Cervical, Axillary, or Inguinal lymphadenopathy.  CBC Lab Results  Component Value Date   WBC 18.9 (H) 06/04/2020   HGB 16.0 06/04/2020   HCT 47.4 06/04/2020   MCV 88.3 06/04/2020   PLT 178 06/04/2020   BMET    Component Value  Date/Time   NA 137 06/04/2020 0756   K 4.0 06/04/2020 0756   CL 107 06/04/2020 0756   CO2 10 (L) 06/04/2020 0756   GLUCOSE 259 (H) 06/04/2020 0756   BUN 22 (H) 06/04/2020 0756   CREATININE 1.89 (H) 06/04/2020 0756   CALCIUM 8.7 (L) 06/04/2020 0756   GFRNONAA 44 (L) 06/04/2020 0756   GFRAA 51 (L) 06/04/2020 0756   Estimated Creatinine Clearance: 65.8 mL/min (A) (by C-G formula based on SCr of 1.89 mg/dL (H)).  COAG Lab Results  Component Value Date   INR 1.2 06/04/2020   Radiology CT Angio Aortobifemoral W and/or Wo Contrast  Result Date: 06/04/2020 CLINICAL DATA:  Acute back pain, bilateral leg pain, mottled lower extremity EXAM: CT ANGIOGRAPHY OF ABDOMINAL AORTA WITH ILIOFEMORAL RUNOFF TECHNIQUE: Multidetector CT imaging of the abdomen, pelvis and lower extremities was performed using the standard protocol during bolus administration of intravenous contrast. Multiplanar CT image reconstructions and MIPs were obtained to evaluate the vascular  anatomy. CONTRAST:  OMNIPAQUE IOHEXOL 350 MG/ML SOLN COMPARISON:  None. FINDINGS: VASCULAR Aorta: Normal caliber aorta without aneurysm, dissection, vasculitis or significant stenosis. Celiac: Patent without evidence of aneurysm, dissection, vasculitis or significant stenosis. SMA: Patent without evidence of aneurysm, dissection, vasculitis or significant stenosis. Renals: Single left renal artery, unremarkable. Single right renal artery, diffusely diminutive in size. IMA: Patent without evidence of aneurysm, dissection, vasculitis or significant stenosis. RIGHT Lower Extremity Inflow: Common, internal and external iliac arteries are patent without evidence of aneurysm, dissection, vasculitis or significant stenosis. Outflow: Common, superficial and profunda femoral arteries and the proximal popliteal artery are patent without evidence of aneurysm, dissection, vasculitis or significant stenosis. There is asymmetric SFA opacification, left greater  than right. There is incomplete opacification of the distal right popliteal artery. Runoff: No significant contrast opacification. LEFT Lower Extremity Inflow: Common, internal and external iliac arteries are patent without evidence of aneurysm, dissection, vasculitis or significant stenosis. Outflow: Common, superficial and profunda femoral arteries and the popliteal artery are patent without evidence of aneurysm, dissection, vasculitis or significant stenosis. Runoff: Patent 3 vessel runoff to the mid calf, incomplete opacification distally presumably related to scan timing. Veins: Dilated right left common iliac vein and iliocaval confluence up to 3.2 cm diameter, with tapered narrowing of the IVC to 1.4 cm in its infrarenal segment. Due to arterial scan timing, no venous opacification to assess for thrombus. Review of the MIP images confirms the above findings. NON-VASCULAR Lower chest: No pleural or pericardial effusion. Visualized lung bases clear. Hepatobiliary: No focal liver abnormality is seen. No gallstones, gallbladder wall thickening, or biliary dilatation. Pancreas: Unremarkable. No pancreatic ductal dilatation or surrounding inflammatory changes. Spleen: Normal in size.  Single small calcified granuloma. Adrenals/Urinary Tract: Adrenal glands unremarkable. Left kidney normal. There is massive right hydronephrosis and proximal ureterectasis with advanced atrophy of the renal parenchyma, only a thin rim of tissue evident. The right ureterectasis terminates at the level of a right retroperitoneal soft tissue mass containing coarse calcifications process abutting the right common iliac vein and distal IVC. Distal right ureter decompressed. Urinary bladder nondistended, mildly thick-walled. Thick-walled urachal remnant with soft tissue nodularity containing 2 foci of calcification. Stomach/Bowel: Stomach is partially distended by ingested fluid. The small bowel is nondistended. normal appendix. The colon  is decompressed, unremarkable. Lymphatic: Multiple prominent subcentimeter right external iliac, bilateral common iliac, left para-aortic and aortocaval lymph nodes. No mesenteric adenopathy. Reproductive: Prostate is unremarkable. Other: No ascites.  No free air. Musculoskeletal: Soft tissue mass containing coarse calcifications extending over a craniocaudal length of 7.2 cm, abutting right common iliac vein and ilio caval confluence, in corresponding to transition point of proximal right ureterectasis. Left knee medial compartment DJD. No fracture or worrisome bone lesion. IMPRESSION: 1. No aortic dissection, occlusion or stenosis. 2. Asymmetric flow to lower extremities left greater than right, limiting evaluation of distal right popliteal artery and runoff such that acute embolus cannot be confidently excluded. 3. Dilated left common iliac vein and iliocaval confluence up to 3.2 cm diameter, with tapered narrowing of the IVC to 1.4 cm in its infrarenal segment. Given the clinical history, bilateral lower extremity venous ultrasound may be useful for further evaluation. 4. Right retroperitoneal soft tissue mass containing coarse calcifications, abutting the right common iliac vein and distal IVC. 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 MR may be useful for better soft tissue characterization. 5. Thick-walled urachal remnant with soft tissue nodularity containing  2 foci of calcification. Recommend urologic consultation. Electronically Signed   By: Corlis Leak M.D.   On: 06/04/2020 10:35   Assessment/Plan The patient is a 39 year old male with no significant past medical history who presented to the [facility] emergency department with bilateral lower extremity discomfort  1.  Bilateral lower extremity ischemia: Patient found to have acute thrombus bilaterally with asymmetric  flow left greater than right.  Recommend undergoing a right lower extremity angiogram with possible intervention and attempt to revascularize the right lower extremity.  We will have the patient return to the angio suite tomorrow to undergo left lower extremity angiogram and attempt to revascularize his left lower extremity.  2.  Right retroperitoneal soft tissue mass: Once the patient's bilateral lower extremities are revascularized we will have the patient undergo MRI to visualize this retroperitoneal soft tissue mass more appropriately.  3.  Rule out diabetes: Patient with elevated blood glucose. Will order hemoglobin A1c  Discussed with Dr. Weldon Inches, PA-C  06/04/2020 2:37 PM

## 2020-06-05 ENCOUNTER — Encounter: Payer: Self-pay | Admitting: Vascular Surgery

## 2020-06-05 ENCOUNTER — Inpatient Hospital Stay (HOSPITAL_COMMUNITY)
Admit: 2020-06-05 | Discharge: 2020-06-05 | Disposition: A | Discharge status: Home / Self Care | Payer: Self-pay | Attending: Vascular Surgery | Admitting: Vascular Surgery

## 2020-06-05 DIAGNOSIS — I5021 Acute systolic (congestive) heart failure: Secondary | ICD-10-CM

## 2020-06-05 DIAGNOSIS — M79606 Pain in leg, unspecified: Secondary | ICD-10-CM | POA: Diagnosis present

## 2020-06-05 LAB — ECHOCARDIOGRAM COMPLETE
AR max vel: 2.14 cm2
AV Area VTI: 2.4 cm2
AV Area mean vel: 2.31 cm2
AV Mean grad: 8 mmHg
AV Peak grad: 14.4 mmHg
Ao pk vel: 1.9 m/s
Area-P 1/2: 4.1 cm2
Height: 71 in
S' Lateral: 2.02 cm
Weight: 3680 oz

## 2020-06-05 LAB — BASIC METABOLIC PANEL
Anion gap: 9 (ref 5–15)
BUN: 21 mg/dL — ABNORMAL HIGH (ref 6–20)
CO2: 19 mmol/L — ABNORMAL LOW (ref 22–32)
Calcium: 8.4 mg/dL — ABNORMAL LOW (ref 8.9–10.3)
Chloride: 109 mmol/L (ref 98–111)
Creatinine, Ser: 1.57 mg/dL — ABNORMAL HIGH (ref 0.61–1.24)
GFR calc Af Amer: >60 mL/min (ref >60)
GFR calc non Af Amer: 55 mL/min — ABNORMAL LOW (ref >60)
Glucose, Bld: 131 mg/dL — ABNORMAL HIGH (ref 70–99)
Potassium: 4.7 mmol/L (ref 3.5–5.1)
Sodium: 137 mmol/L (ref 135–145)

## 2020-06-05 LAB — CBC
HCT: 42.5 % (ref 39.0–52.0)
Hemoglobin: 15 g/dL (ref 13.0–17.0)
MCH: 30.5 pg (ref 26.0–34.0)
MCHC: 35.3 g/dL (ref 30.0–36.0)
MCV: 86.4 fL (ref 80.0–100.0)
Platelets: 124 10*3/uL — ABNORMAL LOW (ref 150–400)
RBC: 4.92 MIL/uL (ref 4.22–5.81)
RDW: 12.7 % (ref 11.5–15.5)
WBC: 11.5 10*3/uL — ABNORMAL HIGH (ref 4.0–10.5)
nRBC: 0 % (ref 0.0–0.2)

## 2020-06-05 LAB — TYPE AND SCREEN
ABO/RH(D): O POS
Antibody Screen: NEGATIVE

## 2020-06-05 LAB — HIV ANTIBODY (ROUTINE TESTING W REFLEX): HIV Screen 4th Generation wRfx: NONREACTIVE

## 2020-06-05 LAB — MAGNESIUM: Magnesium: 1.6 mg/dL — ABNORMAL LOW (ref 1.7–2.4)

## 2020-06-05 MED ORDER — MORPHINE SULFATE (PF) 2 MG/ML IV SOLN
2.0000 mg | INTRAVENOUS | Status: DC | PRN
  Administered 2020-06-05 – 2020-06-10 (×19): 2 mg via INTRAVENOUS
  Filled 2020-06-05 (×19): qty 1

## 2020-06-05 MED ORDER — INSULIN ASPART 100 UNIT/ML ~~LOC~~ SOLN: 0.0000 [IU] | Freq: Three times a day (TID) | SUBCUTANEOUS | Status: DC

## 2020-06-05 MED ORDER — METOPROLOL TARTRATE 5 MG/5ML IV SOLN
5.0000 mg | Freq: Once | INTRAVENOUS | Status: AC
  Administered 2020-06-05: 5 mg via INTRAVENOUS
  Filled 2020-06-05: qty 5

## 2020-06-05 MED ORDER — MAGNESIUM SULFATE 2 GM/50ML IV SOLN
2.0000 g | Freq: Once | INTRAVENOUS | Status: AC
  Administered 2020-06-05: 2 g via INTRAVENOUS
  Filled 2020-06-05: qty 50

## 2020-06-05 NOTE — Progress Notes (Signed)
*  PRELIMINARY RESULTS* Echocardiogram 2D Echocardiogram has been performed.  Austin Glover 06/05/2020, 4:43 PM

## 2020-06-05 NOTE — Progress Notes (Signed)
[facility] Daily Progress Note  Subjective: 06/04/20: 1. Ultrasound guidance for vascular access left femoral artery 2. Catheter placement into right SFA from left femoral approach 3. Aortogram and selective bilateral lower extremity angiogram 4. StarClose closure device left femoral artery  Patient without complaints this AM.  No issues overnight.  Objective: Vitals:   06/05/20 0011 06/05/20 0203 06/05/20 0443 06/05/20 0824  BP: 131/86 (!) 126/96 (!) 130/96 (!) 146/88  Pulse: (!) 116 (!) 111 (!) 117 (!) 106  Resp: 18 16 20 16   Temp: 98.8 F (37.1 C) 98 F (36.7 C) 98.2 F (36.8 C) 98.5 F (36.9 C)  TempSrc: Oral Oral Oral Oral  SpO2: 97% 100% 98% 98%  Weight:      Height:        Intake/Output Summary (Last 24 hours) at 06/05/2020 1116 Last data filed at 06/05/2020 06/07/2020 Gross per 24 hour  Intake 954.06 ml  Output 800 ml  Net 154.06 ml   Physical Exam: A&Ox3, NAD CV: RRR Pulmonary: CTA Bilaterally Abdomen: Soft, Nontender, Nondistended Vascular: Warm distal toes   Laboratory: CBC    Component Value Date/Time   WBC 11.5 (H) 06/05/2020 0521   HGB 15.0 06/05/2020 0521   HCT 42.5 06/05/2020 0521   PLT 124 (L) 06/05/2020 0521   BMET    Component Value Date/Time   NA 137 06/05/2020 0521   K 4.7 06/05/2020 0521   CL 109 06/05/2020 0521   CO2 19 (L) 06/05/2020 0521   GLUCOSE 131 (H) 06/05/2020 0521   BUN 21 (H) 06/05/2020 0521   CREATININE 1.57 (H) 06/05/2020 0521   CALCIUM 8.4 (L) 06/05/2020 0521   GFRNONAA 55 (L) 06/05/2020 0521   GFRAA >60 06/05/2020 0521   Assessment/Planning: The patient is a 39 year old male with no known significant past medical history [facility] of bilateral [facility] pain  1) CT angio most notable for possible acute embolic etiology to the bilateral legs however during angiogram patient was  found to have normal atherosclerotic/embolic disease.  Patient was noted to have sluggish blood flow distally.  2) at this time, the patient does not have any active vascular issues.  Have consulted and spoke to hospitalist team they will take over care as tomorrow.  Question possible uncontrolled hypertension/diabetes? possible congestive heart failure?  3) as per hospitalist team, ordered a.m. labs, troponin, EKG and echo  Discussed with Dr. [facility] PA-C 06/05/2020 11:16 AM

## 2020-06-05 NOTE — Progress Notes (Signed)
MD Dew notified for instructions for PAD which was instructed to deflate PAD.  PAD deflated.  Gauze intact, clean, dry.  L groin swollen and bruising noted, soft. TED hose donned. BLE elevated.  Will continue to monitor.

## 2020-06-05 NOTE — Progress Notes (Signed)
PT Cancellation Note  Patient Details Name: Austin Glover MRN: [id number] DOB: Sep 11, 1981   Cancelled Treatment:    Reason Eval/Treat Not Completed: Medical issues which prohibited therapy;Other (comment). Patient consult received and reviewed. Per nursing patient is a hold at this time due to patient's pain being too high to participate in therapy; additionally she is awaiting physician response in regard to increased LE swelling and decreased temperature. Will attempt again at later time   [name], PT, DPT   06/05/2020, 3:31 PM

## 2020-06-05 NOTE — Progress Notes (Addendum)
   06/05/20 2017  Assess: MEWS Score  Temp 98.9 F (37.2 C)  BP (!) 142/92  Pulse Rate (!) 133  Resp 16  Level of Consciousness Alert  SpO2 98 %  O2 Device Room Air  Assess: MEWS Score  MEWS Temp 0  MEWS Systolic 0  MEWS Pulse 3  MEWS RR 0  MEWS LOC 0  MEWS Score 3  MEWS Score Color Yellow  Assess: if the MEWS score is Yellow or Red  Were vital signs taken at a resting state? Yes  Focused Assessment Documented focused assessment  Early Detection of Sepsis Score *See Row Information* Low  MEWS guidelines implemented *See Row Information* No, previously yellow, continue vital signs every 4 hours (continue Q4 vs, MD aware)   Has previously been yellow, continue Q4 VS. MD aware of tachycardia. Will continue to monitor  HR sustaining in the 130s. Tried to contact Vascular team listed but he is not on call. On call NP Jon Billings has kindly agreed to review patient, hospitalist has been consulted and will take over care from tomorrow. Mg will be replaced 1.6 today.

## 2020-06-06 ENCOUNTER — Inpatient Hospital Stay: Payer: Self-pay

## 2020-06-06 ENCOUNTER — Encounter: Payer: Self-pay | Admitting: Vascular Surgery

## 2020-06-06 DIAGNOSIS — M79605 Pain in left leg: Secondary | ICD-10-CM

## 2020-06-06 DIAGNOSIS — M79604 Pain in right leg: Secondary | ICD-10-CM

## 2020-06-06 LAB — URINALYSIS, COMPLETE (UACMP) WITH MICROSCOPIC
Bacteria, UA: NONE SEEN
Bilirubin Urine: NEGATIVE
Glucose, UA: NEGATIVE mg/dL
Hgb urine dipstick: NEGATIVE
Ketones, ur: NEGATIVE mg/dL
Leukocytes,Ua: NEGATIVE
Nitrite: NEGATIVE
Protein, ur: NEGATIVE mg/dL
Specific Gravity, Urine: 1.004 — ABNORMAL LOW (ref 1.005–1.030)
Squamous Epithelial / HPF: NONE SEEN (ref 0–5)
pH: 6 (ref 5.0–8.0)

## 2020-06-06 LAB — CBC
HCT: 36.4 % — ABNORMAL LOW (ref 39.0–52.0)
Hemoglobin: 12.8 g/dL — ABNORMAL LOW (ref 13.0–17.0)
MCH: 30.1 pg (ref 26.0–34.0)
MCHC: 35.2 g/dL (ref 30.0–36.0)
MCV: 85.6 fL (ref 80.0–100.0)
Platelets: 84 10*3/uL — ABNORMAL LOW (ref 150–400)
RBC: 4.25 MIL/uL (ref 4.22–5.81)
RDW: 12.2 % (ref 11.5–15.5)
WBC: 9.4 10*3/uL (ref 4.0–10.5)
nRBC: 0 % (ref 0.0–0.2)

## 2020-06-06 LAB — BASIC METABOLIC PANEL
Anion gap: 6 (ref 5–15)
BUN: 19 mg/dL (ref 6–20)
CO2: 23 mmol/L (ref 22–32)
Calcium: 8.5 mg/dL — ABNORMAL LOW (ref 8.9–10.3)
Chloride: 102 mmol/L (ref 98–111)
Creatinine, Ser: 1.62 mg/dL — ABNORMAL HIGH (ref 0.61–1.24)
GFR calc Af Amer: >60 mL/min (ref >60)
GFR calc non Af Amer: 53 mL/min — ABNORMAL LOW (ref >60)
Glucose, Bld: 118 mg/dL — ABNORMAL HIGH (ref 70–99)
Potassium: 4.2 mmol/L (ref 3.5–5.1)
Sodium: 131 mmol/L — ABNORMAL LOW (ref 135–145)

## 2020-06-06 LAB — CBC WITH DIFFERENTIAL/PLATELET
Abs Immature Granulocytes: 0.06 10*3/uL (ref 0.00–0.07)
Basophils Absolute: 0 10*3/uL (ref 0.0–0.1)
Basophils Relative: 0 %
Eosinophils Absolute: 0.1 10*3/uL (ref 0.0–0.5)
Eosinophils Relative: 1 %
HCT: 35.2 % — ABNORMAL LOW (ref 39.0–52.0)
Hemoglobin: 12.4 g/dL — ABNORMAL LOW (ref 13.0–17.0)
Immature Granulocytes: 1 %
Lymphocytes Relative: 18 %
Lymphs Abs: 1.6 10*3/uL (ref 0.7–4.0)
MCH: 30.2 pg (ref 26.0–34.0)
MCHC: 35.2 g/dL (ref 30.0–36.0)
MCV: 85.6 fL (ref 80.0–100.0)
Monocytes Absolute: 1 10*3/uL (ref 0.1–1.0)
Monocytes Relative: 11 %
Neutro Abs: 6.6 10*3/uL (ref 1.7–7.7)
Neutrophils Relative %: 69 %
Platelets: 84 10*3/uL — ABNORMAL LOW (ref 150–400)
RBC: 4.11 MIL/uL — ABNORMAL LOW (ref 4.22–5.81)
RDW: 12.1 % (ref 11.5–15.5)
WBC: 9.3 10*3/uL (ref 4.0–10.5)
nRBC: 0 % (ref 0.0–0.2)

## 2020-06-06 LAB — GLUCOSE, CAPILLARY
Glucose-Capillary: 110 mg/dL — ABNORMAL HIGH (ref 70–99)
Glucose-Capillary: 106 mg/dL — ABNORMAL HIGH (ref 70–99)
Glucose-Capillary: 121 mg/dL — ABNORMAL HIGH (ref 70–99)
Glucose-Capillary: 150 mg/dL — ABNORMAL HIGH (ref 70–99)

## 2020-06-06 LAB — COMPREHENSIVE METABOLIC PANEL
ALT: 24 U/L (ref 0–44)
AST: 32 U/L (ref 15–41)
Albumin: 3.4 g/dL — ABNORMAL LOW (ref 3.5–5.0)
Alkaline Phosphatase: 61 U/L (ref 38–126)
Anion gap: 6 (ref 5–15)
BUN: 17 mg/dL (ref 6–20)
CO2: 22 mmol/L (ref 22–32)
Calcium: 8.5 mg/dL — ABNORMAL LOW (ref 8.9–10.3)
Chloride: 104 mmol/L (ref 98–111)
Creatinine, Ser: 1.45 mg/dL — ABNORMAL HIGH (ref 0.61–1.24)
GFR calc Af Amer: >60 mL/min (ref >60)
GFR calc non Af Amer: >60 mL/min (ref >60)
Glucose, Bld: 120 mg/dL — ABNORMAL HIGH (ref 70–99)
Potassium: 4 mmol/L (ref 3.5–5.1)
Sodium: 132 mmol/L — ABNORMAL LOW (ref 135–145)
Total Bilirubin: 1.1 mg/dL (ref 0.3–1.2)
Total Protein: 6.3 g/dL — ABNORMAL LOW (ref 6.5–8.1)

## 2020-06-06 LAB — C-REACTIVE PROTEIN: CRP: 10.4 mg/dL — ABNORMAL HIGH (ref <1.0)

## 2020-06-06 LAB — MAGNESIUM: Magnesium: 2.1 mg/dL (ref 1.7–2.4)

## 2020-06-06 LAB — FIBRIN DERIVATIVES D-DIMER (ARMC ONLY): Fibrin derivatives D-dimer (ARMC): >7500 ng/mL (FEU) — ABNORMAL HIGH (ref 0.00–499.00)

## 2020-06-06 LAB — TSH: TSH: 2.787 u[IU]/mL (ref 0.350–4.500)

## 2020-06-06 LAB — APTT
aPTT: 70 seconds — ABNORMAL HIGH (ref 24–36)
aPTT: 79 seconds — ABNORMAL HIGH (ref 24–36)

## 2020-06-06 LAB — CREATININE, URINE, RANDOM: Creatinine, Urine: 41 mg/dL

## 2020-06-06 LAB — HEMOGLOBIN A1C
Hgb A1c MFr Bld: 5.5 % (ref 4.8–5.6)
Hgb A1c MFr Bld: 5.5 % (ref 4.8–5.6)
Mean Plasma Glucose: 111.15 mg/dL
Mean Plasma Glucose: 111.15 mg/dL

## 2020-06-06 LAB — SEDIMENTATION RATE: Sed Rate: 5 mm/hr (ref 0–15)

## 2020-06-06 LAB — CK: Total CK: 801 U/L — ABNORMAL HIGH (ref 49–397)

## 2020-06-06 LAB — SODIUM, URINE, RANDOM: Sodium, Ur: <10 mmol/L

## 2020-06-06 IMAGING — CR DG CHEST 2V
1 series · 3 of 3 positions shown · non-contrast
Comparison: None.

CLINICAL DATA: Bilateral leg pain

EXAM:
CHEST - 2 VIEW

[Series 1: dg chest 2 view · 0.14mm/px · 3 of 3 slices shown]
[im 1/3]
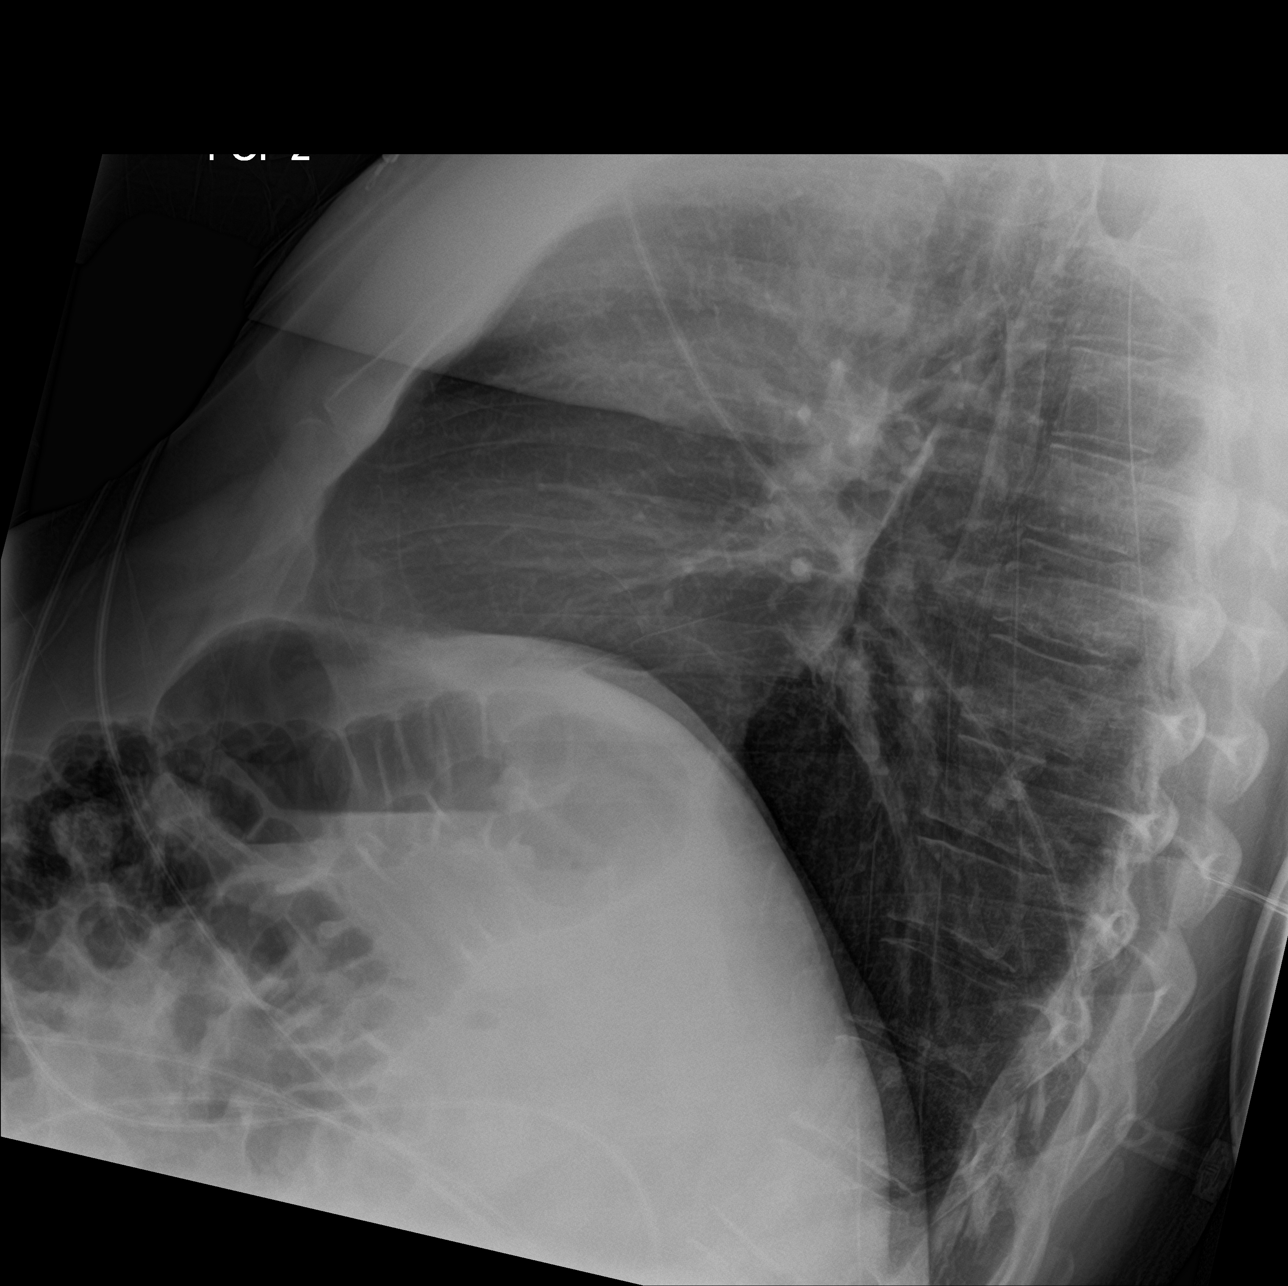
[im 2/3]
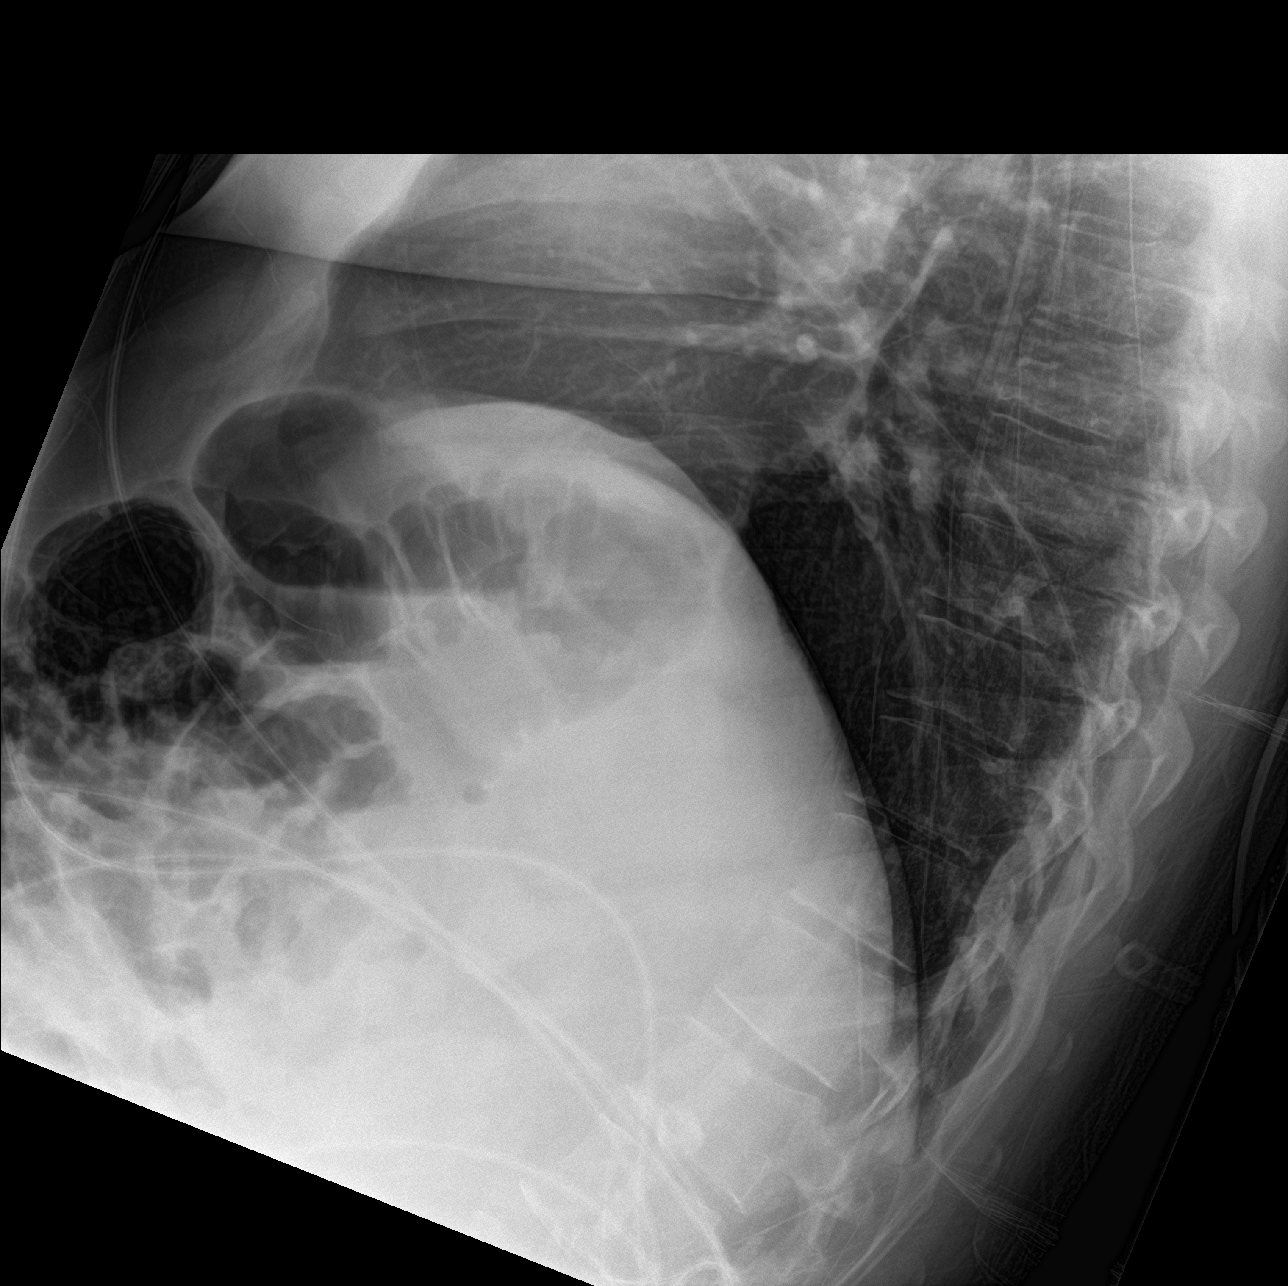
[im 3/3]
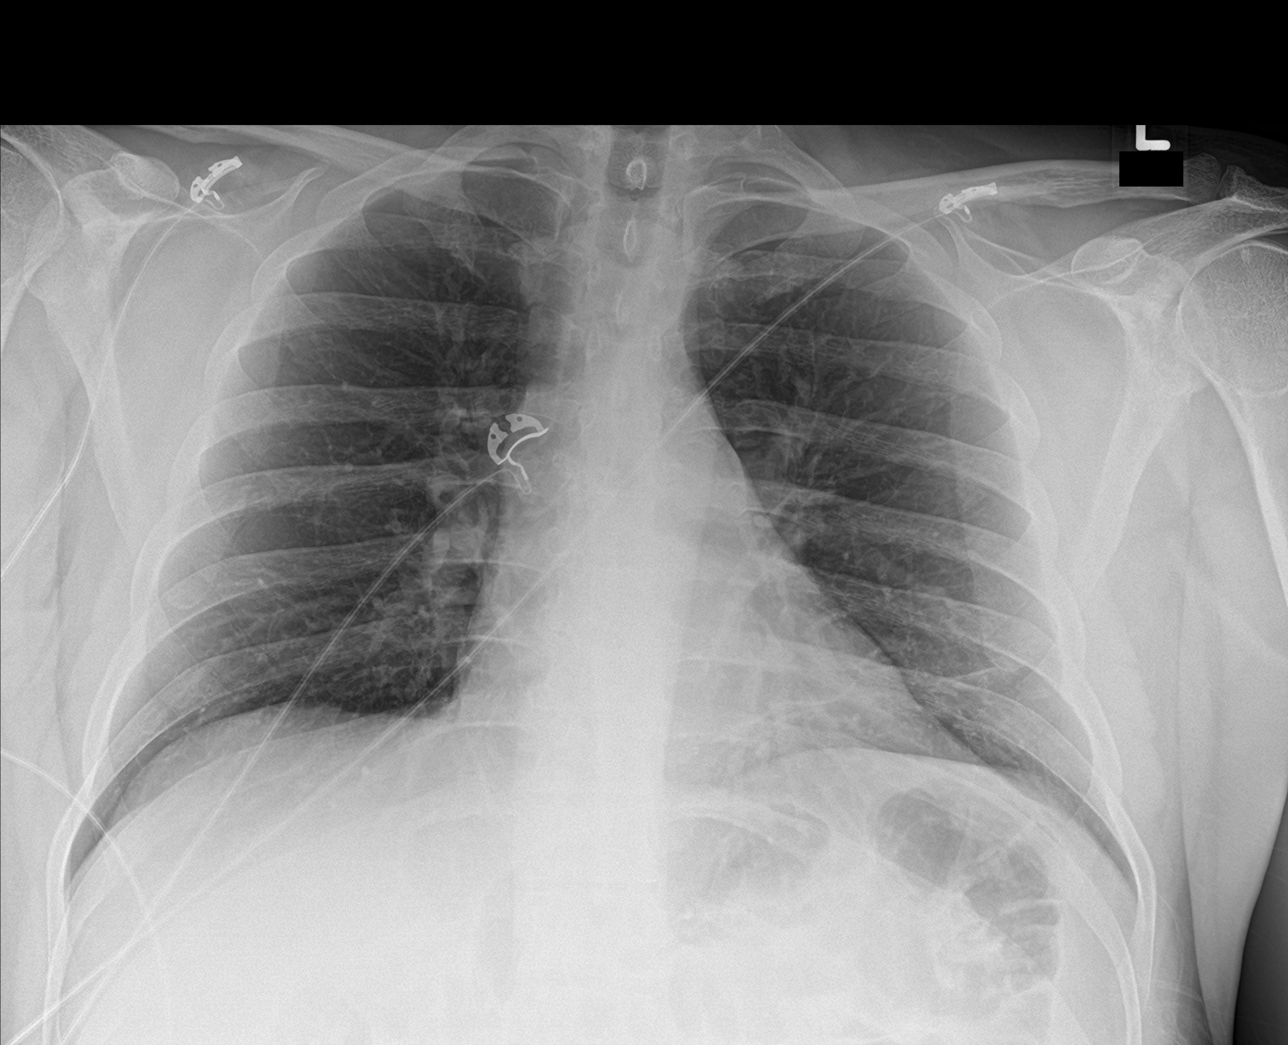

[3 of 3 positions shown; findings below may reference images not displayed]

FINDINGS: The heart size and mediastinal contours are within normal limits.
Both lungs are clear. The visualized skeletal structures are
unremarkable.
IMPRESSION: No active cardiopulmonary disease.

## 2020-06-06 IMAGING — MR MR ABDOMEN WO/W CM
19 series · 48 of 48 positions shown · IV contrast (gadavist)
Comparison: CT angiogram [DATE], prior duplex [DATE]

CLINICAL DATA: 39-year-old male with a history of abdominal mass,
new lower extremity swelling

EXAM:
MRI ABDOMEN WITHOUT AND WITH CONTRAST
TECHNIQUE: Multiplanar multisequence MR imaging of the abdomen was performed
both before and after the administration of intravenous contrast.
CONTRAST:  10mL GADAVIST GADOBUTROL 1 MMOL/ML IV SOLN

[Series 2: T2 · coronal · 6.0mm · 1.19mm/px · 2 of 34 slices shown (1 of 2)]
[im 1/34]
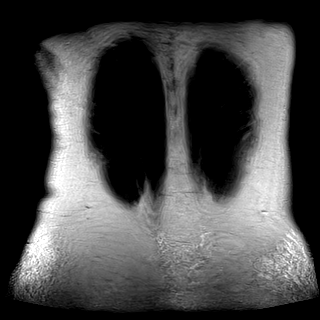
[im 34/34]
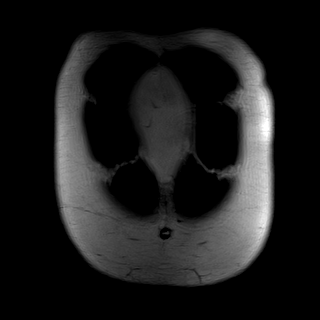

[Series 3: T2 · axial · 6.0mm · 1.25mm/px · z∈[-81,+171]mm · 2 of 36 slices shown (2 of 2)]
[im 1/36]
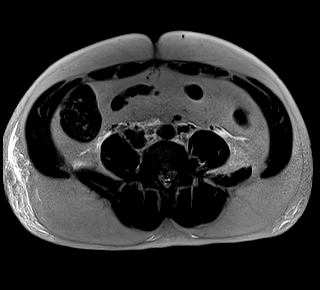
[im 36/36]
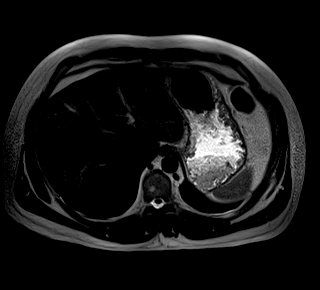

[Series 5: T2 fat-sat · axial · 6.0mm · 1.25mm/px · z∈[-80,+200]mm · 2 of 40 slices shown]
[im 1/40]
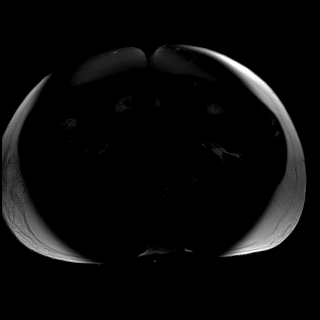
[im 40/40]
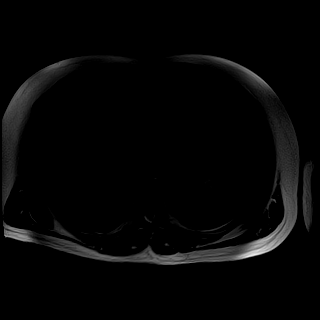

[Series 6: ax dwi_tracew · axial · 6.0mm · 1.42mm/px · z∈[-80,+200]mm · 5 of 120 slices shown]
[im 1/120]
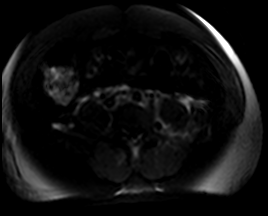
[im 30/120]
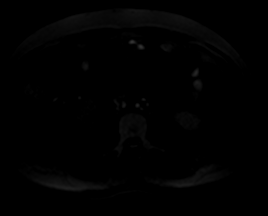
[im 60/120]
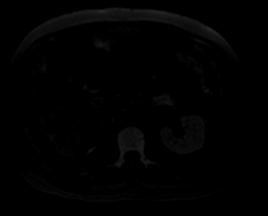
[im 90/120]
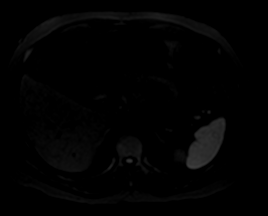
[im 120/120]
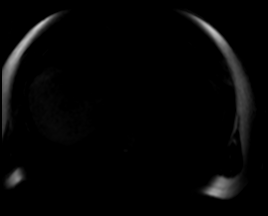

[Series 7: ax dwi_adc · axial · 6.0mm · 1.42mm/px · 1 of 40 slices shown]
[im 1/40]
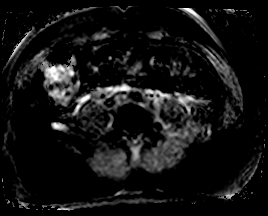

[Series 8: T1 · axial · 6.0mm · 0.74mm/px · 1 of 40 slices shown (1 of 2)]
[im 1/40]
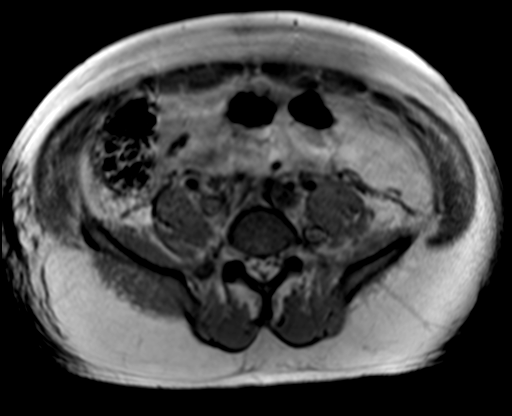

[Series 8: T1 · axial · 6.0mm · 0.74mm/px · 1 of 40 slices shown (2 of 2)]
[im 1/40]
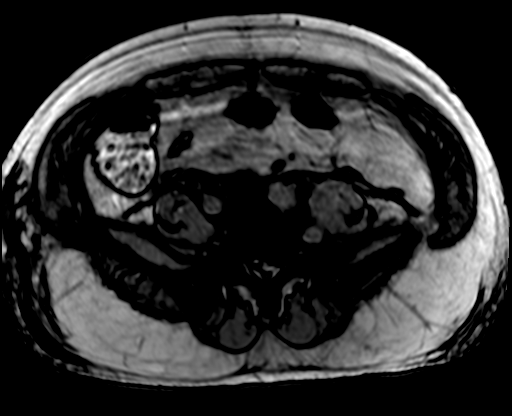

[Series 9: bSSFP · axial · 6.0mm · 0.74mm/px · 1 of 40 slices shown]
[im 1/40]
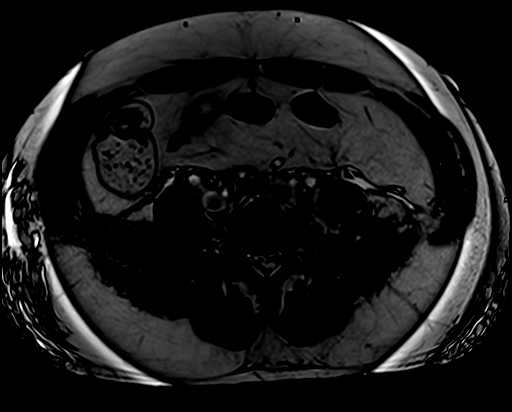

[Series 10: T1 fat-sat · axial · 3.0mm · 1.56mm/px · z∈[-60,+201]mm · 3 of 88 slices shown (1 of 6)]
[im 1/88]
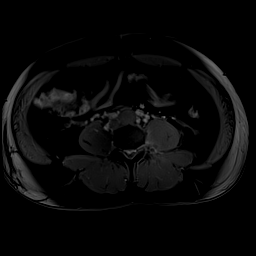
[im 44/88]
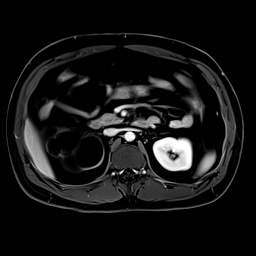
[im 88/88]
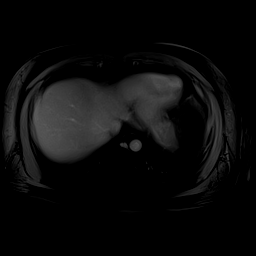

[Series 11: T1 fat-sat · axial · 3.0mm · 1.56mm/px · z∈[-60,+201]mm · 3 of 88 slices shown (2 of 6)]
[im 1/88]
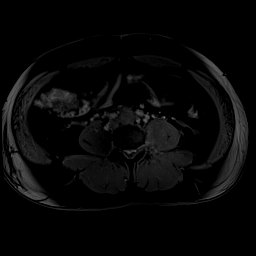
[im 44/88]
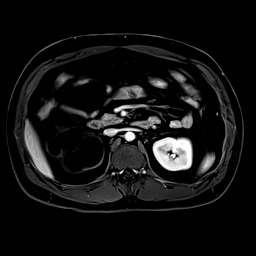
[im 88/88]
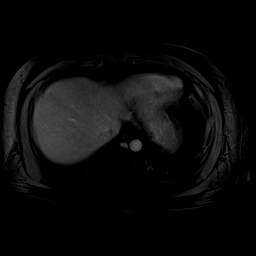

[Series 11: T1 fat-sat · axial · 3.0mm · 1.56mm/px · z∈[-60,+201]mm · 3 of 88 slices shown (3 of 6)]
[im 1/88]
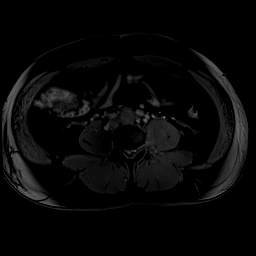
[im 44/88]
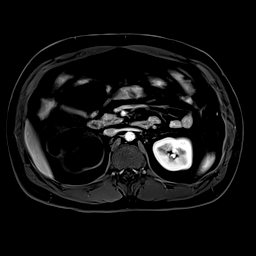
[im 88/88]
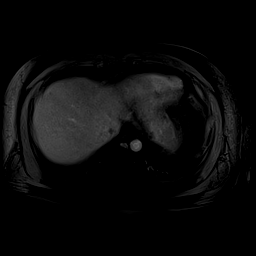

[Series 11: T1 fat-sat · axial · 3.0mm · 1.56mm/px · z∈[-60,+201]mm · 3 of 88 slices shown (4 of 6)]
[im 1/88]
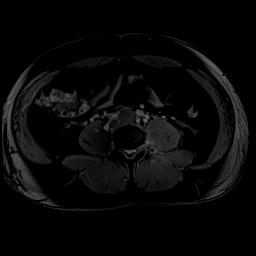
[im 44/88]
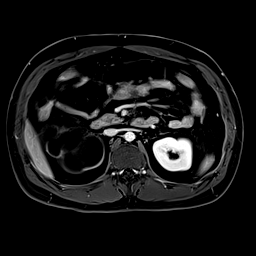
[im 88/88]
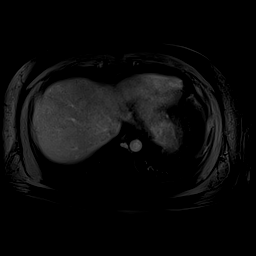

[Series 11: T1 fat-sat · axial · 3.0mm · 1.56mm/px · z∈[-60,+201]mm · 3 of 88 slices shown (5 of 6)]
[im 1/88]
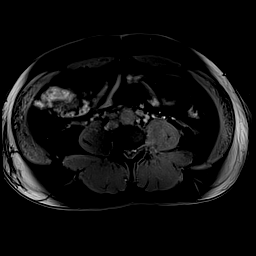
[im 44/88]
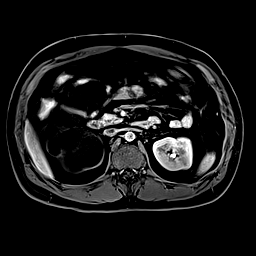
[im 88/88]
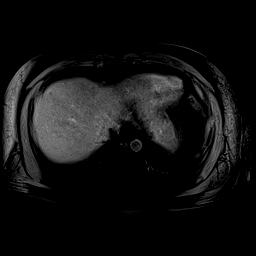

[Series 11: T1 fat-sat · axial · 3.0mm · 1.56mm/px · z∈[-60,+201]mm · 3 of 88 slices shown (6 of 6)]
[im 1/88]
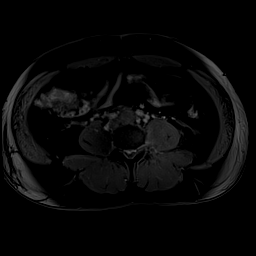
[im 44/88]
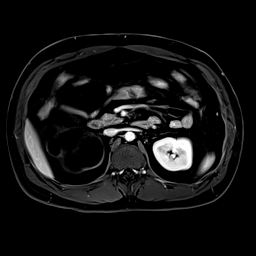
[im 88/88]
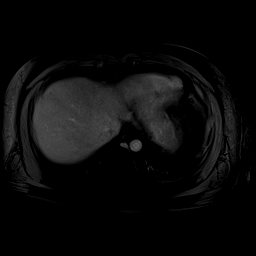

[Series 12: T1 dynamic post-contrast · coronal · 3.0mm · 1.31mm/px · 3 of 72 slices shown]
[im 1/72]
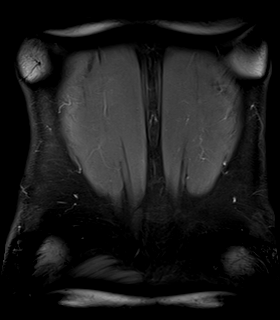
[im 36/72]
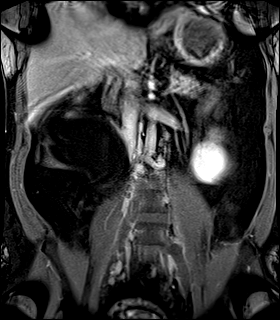
[im 72/72]
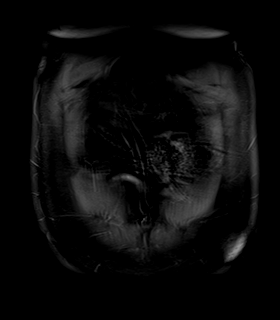

[Series 1001: results sub_p1_t1 fatsat · axial · 3.0mm · 1.56mm/px · z∈[-60,+201]mm · 3 of 88 slices shown (1 of 4)]
[im 1/88]
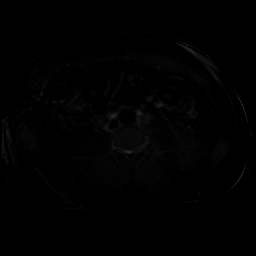
[im 44/88]
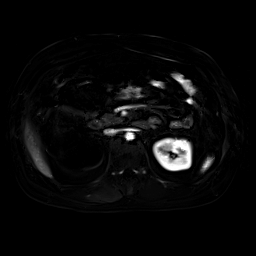
[im 88/88]
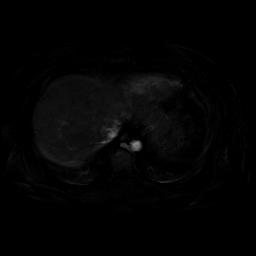

[Series 1001: results sub_p1_t1 fatsat · axial · 3.0mm · 1.56mm/px · z∈[-60,+201]mm · 3 of 88 slices shown (2 of 4)]
[im 1/88]
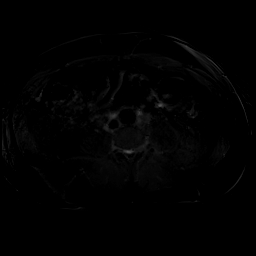
[im 44/88]
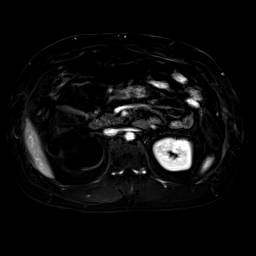
[im 88/88]
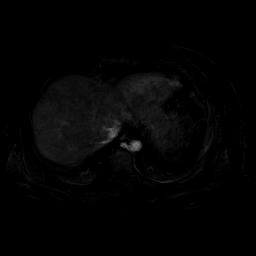

[Series 1001: results sub_p1_t1 fatsat · axial · 3.0mm · 1.56mm/px · z∈[-60,+201]mm · 3 of 88 slices shown (3 of 4)]
[im 1/88]
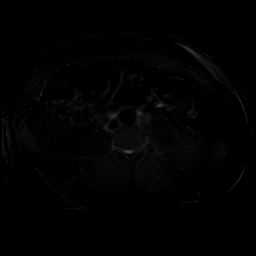
[im 44/88]
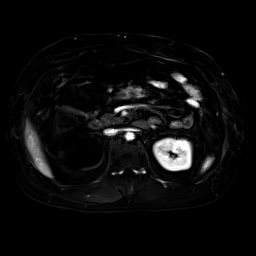
[im 88/88]
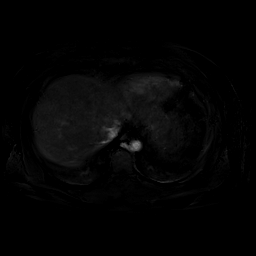

[Series 1001: results sub_p1_t1 fatsat · axial · 3.0mm · 1.56mm/px · z∈[-60,+201]mm · 3 of 88 slices shown (4 of 4)]
[im 1/88]
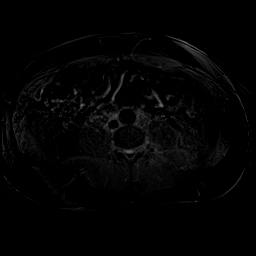
[im 44/88]
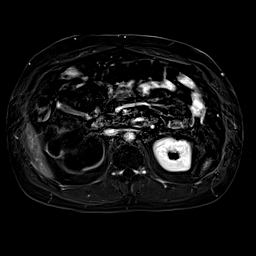
[im 88/88]
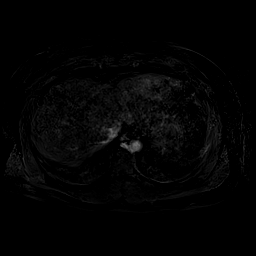

[48 of 48 positions shown; findings below may reference images not displayed]

FINDINGS: Lower chest: Unremarkable

Hepatobiliary: Uniform signal throughout the visualized liver. Small
hypointense focus on the anti dependent gallbladder wall within the
fundus, not visualized on prior CT. This measures 4 mm. No
inflammatory changes.

Pancreas:  Unremarkable

Spleen:  Unremarkable

Adrenals/Urinary Tract:

- Right adrenal gland:  Unremarkable

- Left adrenal gland: Unremarkable.

- Right kidney: Advanced hydronephrosis/pelvicaliectasis of the
right kidney, with complete atrophy of right renal parenchyma.
Dilated and tortuous proximal right ureter which terminates at the
adjacent to the right iliac vasculature/IVC, similar to prior CT. No
inflammatory changes adjacent to the right collecting system. No
focal lesion.

- Left Kidney: No hydronephrosis, nephrolithiasis, inflammation, or
ureteral dilation. No focal lesion.

Stomach/Bowel: Visualized portions within the abdomen are
unremarkable.

Vascular:

Unremarkable course caliber and contour of the visualized aorta.

Unremarkable course caliber and contour of the visualized iliac
arteries.

Celiac artery, superior mesenteric artery, inferior mesenteric
artery remain patent.

Single left renal artery which remains patent.

Small right renal artery.

Portal vasculature unremarkable. Hepatic venous vasculature
unremarkable.

Loss of the normal flow signal of the bilateral external iliac
veins, bilateral common iliac veins. The flow signal absence extends
to the confluence of the bilateral iliac veins, or a small caliber
IVC is identified. As was seen on the comparison CT, the infrarenal
IVC is small caliber. IVC more normal diameter at the level of the
left renal vein inflow, 22 mm.

Loss of flow signal into enlarged left lumbar collateral venous
drainage, which was present on the prior CT.

No significant enlarged superficial body wall collaterals.

Other:  No adenopathy.

Hypointense signal adjacent to the lower IVC and in the distribution
of the lymphatics of the right common iliac pathway, corresponding
to the multifocal calcium on the prior CT.

Musculoskeletal: Unremarkable signal of the visualized skeletal
elements.
IMPRESSION: The MRI confirms complete bilateral common iliac vein and external
iliac vein thrombosis/DVT. There is also at least partial thrombosis
of the internal iliac veins as well as the primary collateral venous
drainage along the left lumbar drainage pathway, in the setting of
chronic infrarenal IVC stenosis/occlusion.

MRI correlate of chronic fibrotic process associated with the right
pelvis resulting in chronic infrarenal IVC occlusion and chronic
right hydronephrosis. As was previously suggested on CT and the MRI
preliminary, the leading differential diagnosis of the etiology is
retroperitoneal fibrosis, either idiopathic or related to
mycobacterial infection. Given the absence of focal soft tissue
lesion, atypical carcinoid or other mesenchymal tumor is not
favored.

Complete atrophy of the right kidney parenchyma with advanced
hydronephrosis/pelvicaliectasis and dilation of the proximal ureter
secondary to ureteral occlusion.

## 2020-06-06 IMAGING — US US EXTREM LOW VENOUS
1 series · 13 of 24 positions shown · non-contrast
Comparison: None.

CLINICAL DATA: Left calf tenderness for 4 days.



[Series 1: us venous img lower bilat (dvt) · portal-venous · 13 of 61 slices shown]
[im 1/61]
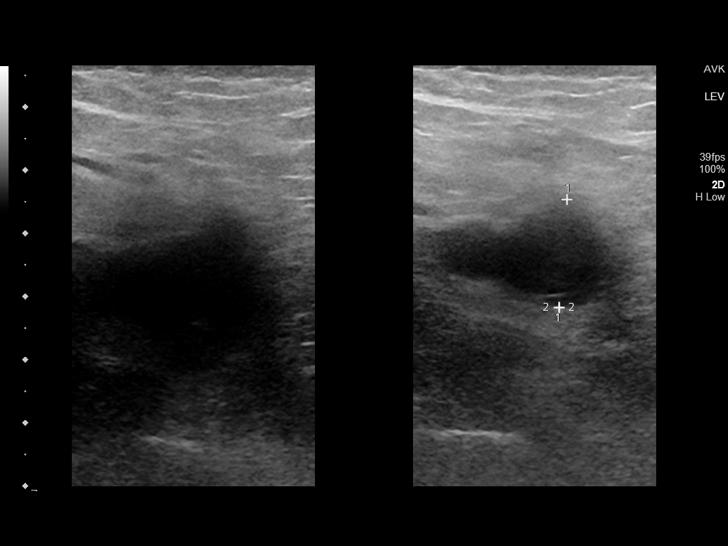
[im 6/61]
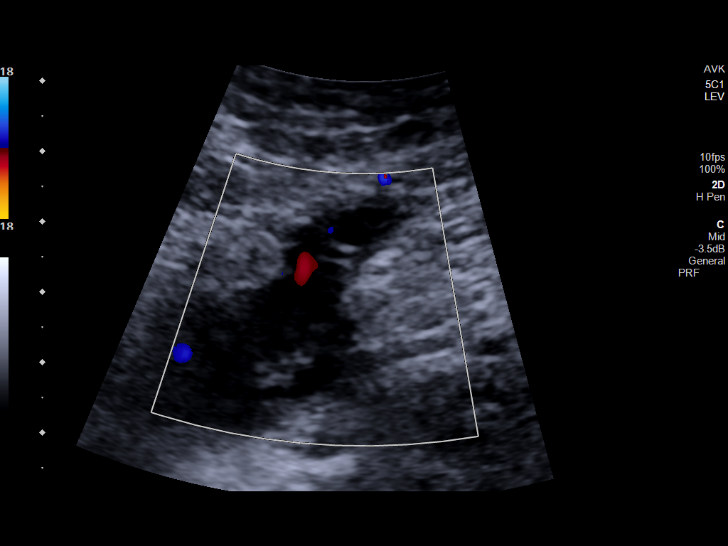
[im 11/61]
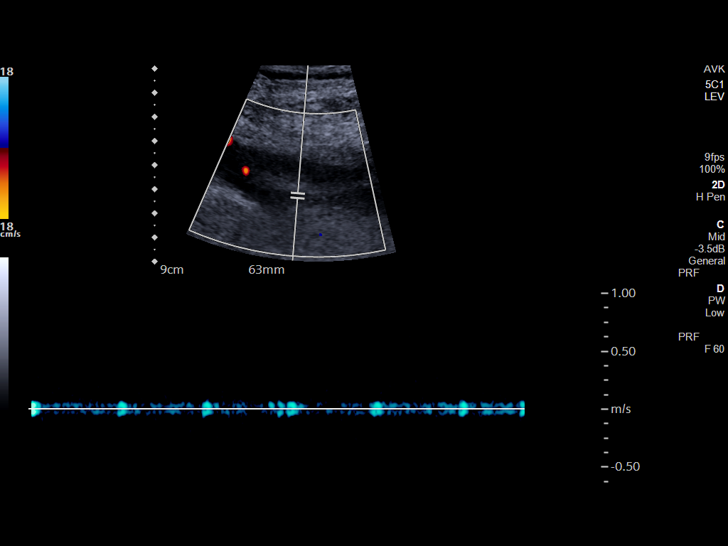
[im 16/61]
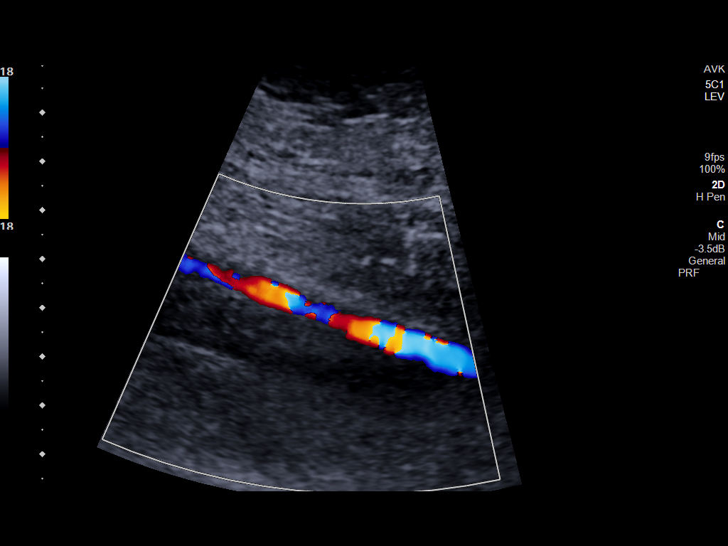
[im 21/61]
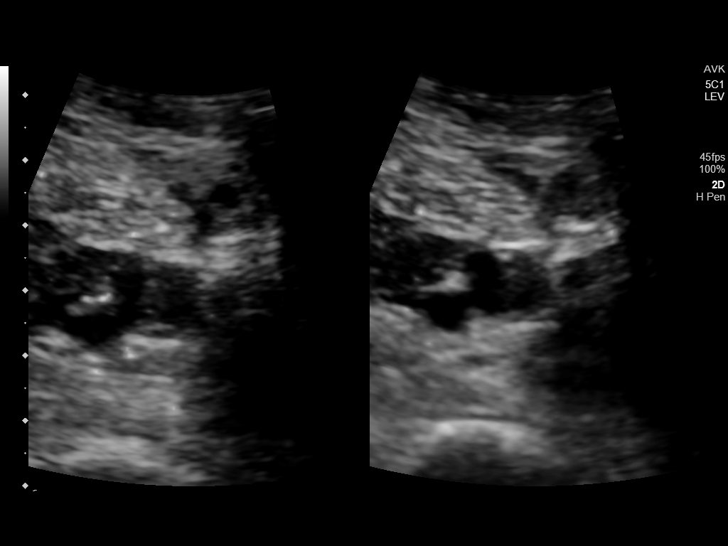
[im 27/61]
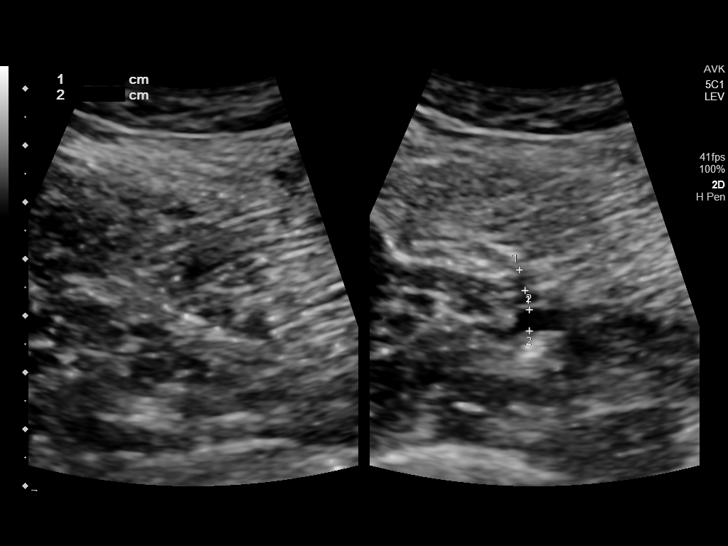
[im 32/61]
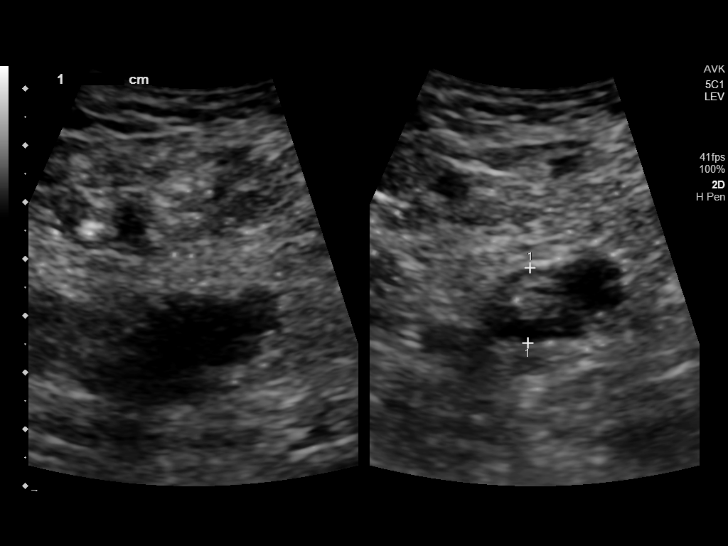
[im 34/61]
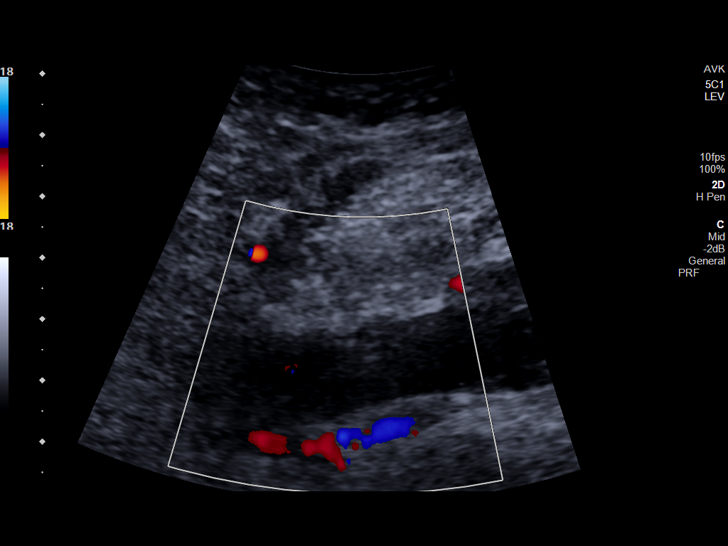
[im 40/61]
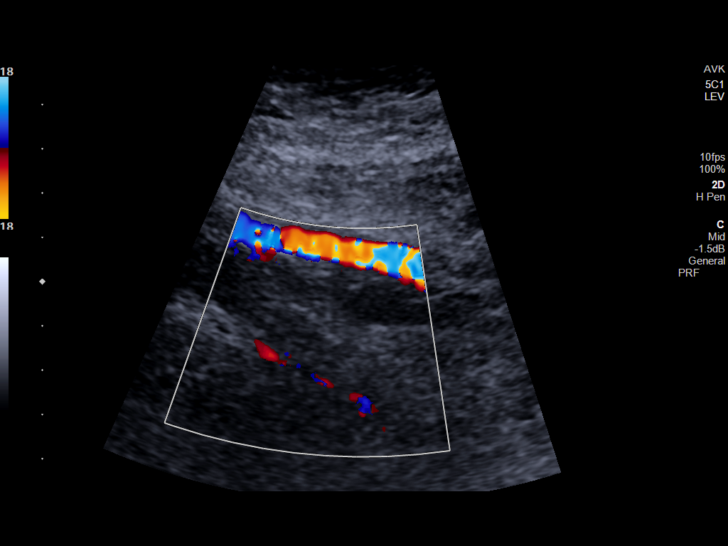
[im 45/61]
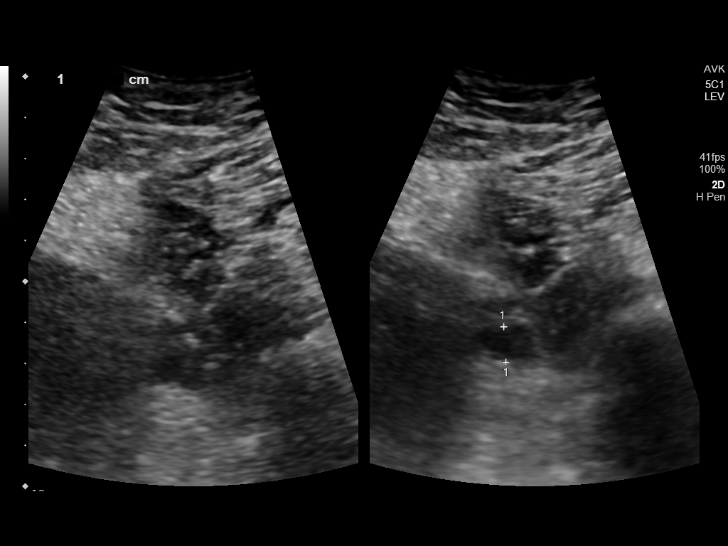
[im 50/61]
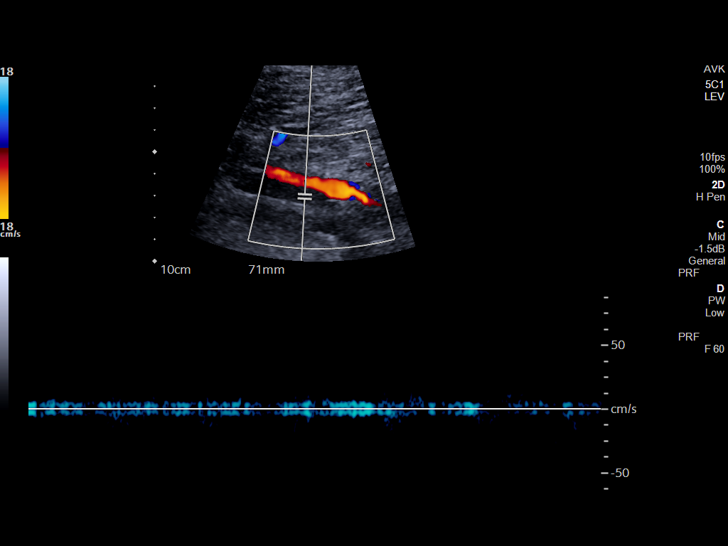
[im 55/61]
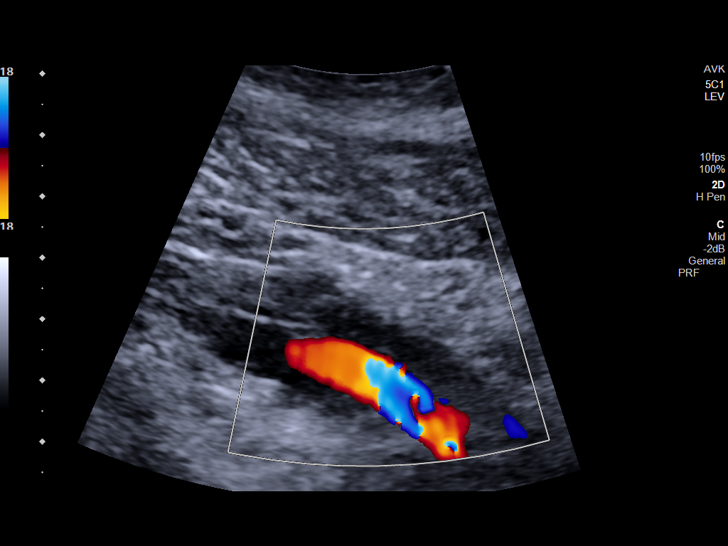
[im 61/61]
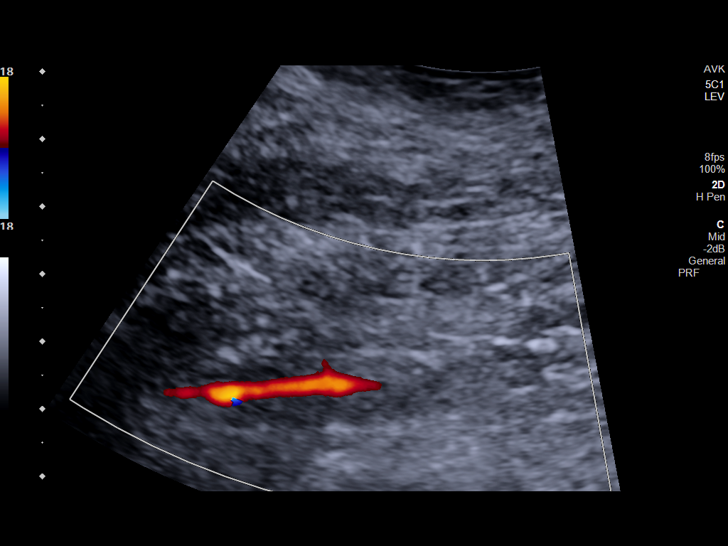

[13 of 24 positions shown; findings below may reference images not displayed]

FINDINGS: RIGHT LOWER EXTREMITY

There is occlusive thrombus involving the right common femoral vein,
saphenofemoral junction, profundus femoral vein, femoral vein
throughout its course, popliteal vein, as well as visualized calf
veins. There is lack of normal compressibility and response to
augmentation.

LEFT LOWER EXTREMITY

Similar to the right there is occlusive thrombus involving the right
common femoral vein, saphenofemoral junction, profundus femoral
vein, femoral vein throughout its course, popliteal vein, as well as
visualized calf veins. There is lack of normal compressibility and
response to augmentation.
IMPRESSION: Bilateral occlusive deep vein thrombus extending from the common
femoral veins through the visualized calf veins.

These results will be called to the ordering clinician or
representative by the Radiologist Assistant, and communication
documented in the PACS or [REDACTED].

## 2020-06-06 MED ORDER — ARGATROBAN 50 MG/50ML IV SOLN
1.0000 ug/kg/min | INTRAVENOUS | Status: DC
  Administered 2020-06-06 – 2020-06-08 (×6): 1 ug/kg/min via INTRAVENOUS
  Filled 2020-06-06 (×7): qty 50

## 2020-06-06 MED ORDER — SODIUM CHLORIDE 0.9 % IV SOLN: INTRAVENOUS | Status: DC

## 2020-06-06 MED ORDER — GADOBUTROL 1 MMOL/ML IV SOLN
10.0000 mL | Freq: Once | INTRAVENOUS | Status: AC | PRN
  Administered 2020-06-06: 10 mL via INTRAVENOUS

## 2020-06-06 MED ORDER — ACETAMINOPHEN 325 MG PO TABS
650.0000 mg | ORAL_TABLET | Freq: Four times a day (QID) | ORAL | Status: DC | PRN
  Administered 2020-06-06 – 2020-06-08 (×2): 650 mg via ORAL
  Filled 2020-06-06 (×2): qty 2

## 2020-06-06 MED ORDER — HEPARIN (PORCINE) 25000 UT/250ML-% IV SOLN: 1500.0000 [IU]/h | INTRAVENOUS | Status: DC

## 2020-06-06 NOTE — Progress Notes (Signed)
Patient transferred to [address] 2A.  Wife and patient oriented to new room and Tammy RN in room with patient

## 2020-06-06 NOTE — Progress Notes (Signed)
ANTICOAGULATION CONSULT NOTE  Pharmacy Consult for Heparin Drip Indication: suspected DVT  No Known Allergies  Patient Measurements: Height: 5\' 11"  (180.3 cm) Weight: 115 kg (253 lb 8.5 oz) IBW/kg (Calculated) : 75.3 Heparin Dosing Weight: 97.2 kg  Vital Signs: Temp: 98.5 F (36.9 C) (07/17 1229) Temp Source: Oral (07/17 1229) BP: 117/83 (07/17 1229) Pulse Rate: 108 (07/17 1240)  Labs: Recent Labs    06/04/20 0756 06/04/20 0756 06/04/20 1018 06/04/20 1820 06/05/20 0521 06/05/20 2339 06/06/20 0351  HGB 16.0   < >  --   --  15.0  --  12.8*  HCT 47.4  --   --   --  42.5  --  36.4*  PLT 178  --   --   --  124*  --  84*  APTT  --   --  31  --   --   --   --   LABPROT  --   --  15.1  --   --   --   --   INR  --   --  1.2  --   --   --   --   HEPARINUNFRC  --   --   --  0.60  --   --   --   CREATININE 1.89*   < >  --   --  1.57* 1.62* 1.45*  CKTOTAL 175  --   --   --   --   --  801*   < > = values in this interval not displayed.    Estimated Creatinine Clearance: 90 mL/min (A) (by C-G formula based on SCr of 1.45 mg/dL (H)).   Assessment: Patient is a 39yo male admitted with limb ischemia. Pharmacy consulted for Heparin dosing. No prior anticoagulants noted. Patient with previous angiogram with bleeding to femoral site 7/15. Heparin stopped at that time. Hg/plt have trended down. Pharmacy re-consulted for heparin dosing for suspected DVT.  Goal of Therapy:  Heparin level 0.3-0.7 units/ml Monitor platelets by anticoagulation protocol: Yes   Plan:  With downtrend in Hg/plt and previous bleeding from femoral site, will defer heparin bolus and start heparin drip at 1500 units/hr. HL at 2000. CBC daily.  8/15, PharmD Clinical Pharmacist 06/06/2020 1:10 PM

## 2020-06-06 NOTE — Progress Notes (Signed)
° ° °  Subjective  - POD #2, s/p LE angio  No acute events   Physical Exam:  Left groin with edema, but no compcliation from access for angio       Assessment/Plan:  POD #2  No active vascular issues.  Care will be transitioned to hospital service  Austin Glover 06/06/2020 2:19 PM --  Vitals:   06/06/20 1229 06/06/20 1240  BP: 117/83   Pulse: (!) 127 (!) 108  Resp: 20 20  Temp: 98.5 F (36.9 C)   SpO2: 96% 96%    Intake/Output Summary (Last 24 hours) at 06/06/2020 1419 Last data filed at 06/06/2020 0900 Gross per 24 hour  Intake 2091.87 ml  Output 1470 ml  Net 621.87 ml     Laboratory CBC    Component Value Date/Time   WBC 9.4 06/06/2020 0351   HGB 12.8 (L) 06/06/2020 0351   HCT 36.4 (L) 06/06/2020 0351   PLT 84 (L) 06/06/2020 0351    BMET    Component Value Date/Time   NA 132 (L) 06/06/2020 0351   K 4.0 06/06/2020 0351   CL 104 06/06/2020 0351   CO2 22 06/06/2020 0351   GLUCOSE 120 (H) 06/06/2020 0351   BUN 17 06/06/2020 0351   CREATININE 1.45 (H) 06/06/2020 0351   CALCIUM 8.5 (L) 06/06/2020 0351   GFRNONAA >60 06/06/2020 0351   GFRAA >60 06/06/2020 0351    COAG Lab Results  Component Value Date   INR 1.2 06/04/2020   No results found for: PTT  Antibiotics Anti-infectives (From admission, onward)   Start     Dose/Rate Route Frequency Ordered Stop   06/05/20 0000  ceFAZolin (ANCEF) IVPB 1 g/50 mL premix       Note to Pharmacy: To be given in specials   1 g 100 mL/hr over 30 Minutes Intravenous  Once 06/04/20 1439 06/04/20 1757   06/04/20 0945  ceFEPIme (MAXIPIME) 2 g in sodium chloride 0.9 % 100 mL IVPB        2 g 200 mL/hr over 30 Minutes Intravenous  Once 06/04/20 0939 06/04/20 1107   06/04/20 0945  metroNIDAZOLE (FLAGYL) IVPB 500 mg        500 mg 100 mL/hr over 60 Minutes Intravenous  Once 06/04/20 0939 06/04/20 1203   06/04/20 0945  vancomycin (VANCOCIN) IVPB 1000 mg/200 mL premix        1,000 mg 200 mL/hr over 60 Minutes  Intravenous  Once 06/04/20 0939 06/04/20 1306       V. Charlena Cross, M.D., [facility] [place] Office: [phone] Pager:  [phone]

## 2020-06-06 NOTE — Progress Notes (Signed)
ANTICOAGULATION CONSULT NOTE  Pharmacy Consult for argatroban Indication: suspected DVT  No Known Allergies  Patient Measurements: Height: 5\' 11"  (180.3 cm) Weight: 115 kg (253 lb 8.5 oz) IBW/kg (Calculated) : 75.3 Heparin Dosing Weight: 97.2 kg  Vital Signs: Temp: 98.2 F (36.8 C) (07/17 1929) Temp Source: Oral (07/17 1929) BP: 119/83 (07/17 1929) Pulse Rate: 113 (07/17 1929)  Labs: Recent Labs    06/04/20 0756 06/04/20 0756 06/04/20 1018 06/04/20 1820 06/05/20 0521 06/05/20 0521 06/05/20 2339 06/06/20 0351 06/06/20 1311 06/06/20 2000  HGB 16.0   < >  --   --  15.0   < >  --  12.8* 12.4*  --   HCT 47.4   < >  --   --  42.5  --   --  36.4* 35.2*  --   PLT 178   < >  --   --  124*  --   --  84* 84*  --   APTT  --   --  31  --   --   --   --   --   --  70*  LABPROT  --   --  15.1  --   --   --   --   --   --   --   INR  --   --  1.2  --   --   --   --   --   --   --   HEPARINUNFRC  --   --   --  0.60  --   --   --   --   --   --   CREATININE 1.89*   < >  --   --  1.57*  --  1.62* 1.45*  --   --   CKTOTAL 175  --   --   --   --   --   --  801*  --   --    < > = values in this interval not displayed.    Estimated Creatinine Clearance: 90 mL/min (A) (by C-G formula based on SCr of 1.45 mg/dL (H)).   Assessment: Patient is a 39yo male admitted with limb ischemia. Pharmacy consulted for Heparin dosing. No prior anticoagulants noted. Patient with previous angiogram with bleeding to femoral site 7/15. Heparin stopped at that time. Hg/plt have trended down. Pharmacy re-consulted for heparin dosing for suspected DVT.  Update 1500: Patient with confirmed bilateral occlusive DVTs. Plan for patient to have thrombectomy on Monday. Concern for HIT. Pharmacy consulted for argatroban.  Goal of Therapy:  APTT 50-90 sec Monitor platelets by anticoagulation protocol: Yes   Plan:  07/17 @ 2050 aPTT 70 seconds therapeutic. Will continue current rate of argatroban and recheck aPTT  at 2300 and continue to monitor.  8/17, PharmD, BCPS Clinical Pharmacist 06/06/2020 9:38 PM

## 2020-06-06 NOTE — Progress Notes (Signed)
PT Cancellation Note  Patient Details Name: Austin Glover MRN: [id number] DOB: 1981/04/09   Cancelled Treatment:    Reason Eval/Treat Not Completed: Patient at procedure or test/unavailable (Attempted PT Eval. Patient is not in room. Will re-attempt at later time/date.)  Luretha Murphy. Ilsa Iha, PT, DPT 06/06/20, 12:12 PM

## 2020-06-06 NOTE — Progress Notes (Signed)
Cross Cover Brief Note Nurse reports patient with worsening tachycardia and hypertension Patient admitted under cardiovascular surgery care with critical limb ischemia Due to findings of aortagram hospitalist team to assume care of patient in am 7/17  Review of chart, no H & P available. Was noted tachy on arrival to ER and during stay but was thought to be related to pain. Patient now with minimal pain   CLINICAL DATA:  Acute back pain, bilateral leg pain, mottled lower extremity  EXAM: CT ANGIOGRAPHY OF ABDOMINAL AORTA WITH ILIOFEMORAL RUNOFF  TECHNIQUE: Multidetector CT imaging of the abdomen, pelvis and lower extremities was performed using the standard protocol during bolus administration of intravenous contrast. Multiplanar CT image reconstructions and MIPs were obtained to evaluate the vascular anatomy.  CONTRAST:  OMNIPAQUE IOHEXOL 350 MG/ML SOLN  COMPARISON:  None.  FINDINGS: VASCULAR  Aorta: Normal caliber aorta without aneurysm, dissection, vasculitis or significant stenosis.  Celiac: Patent without evidence of aneurysm, dissection, vasculitis or significant stenosis.  SMA: Patent without evidence of aneurysm, dissection, vasculitis or significant stenosis.  Renals: Single left renal artery, unremarkable. Single right renal artery, diffusely diminutive in size.  IMA: Patent without evidence of aneurysm, dissection, vasculitis or significant stenosis.  RIGHT Lower Extremity  Inflow: Common, internal and external iliac arteries are patent without evidence of aneurysm, dissection, vasculitis or significant stenosis.  Outflow: Common, superficial and profunda femoral arteries and the proximal popliteal artery are patent without evidence of aneurysm, dissection, vasculitis or significant stenosis. There is asymmetric SFA opacification, left greater than right. There is incomplete opacification of the distal right popliteal  artery.  Runoff: No significant contrast opacification.  LEFT Lower Extremity  Inflow: Common, internal and external iliac arteries are patent without evidence of aneurysm, dissection, vasculitis or significant stenosis.  Outflow: Common, superficial and profunda femoral arteries and the popliteal artery are patent without evidence of aneurysm, dissection, vasculitis or significant stenosis.  Runoff: Patent 3 vessel runoff to the mid calf, incomplete opacification distally presumably related to scan timing.  Veins: Dilated right left common iliac vein and iliocaval confluence up to 3.2 cm diameter, with tapered narrowing of the IVC to 1.4 cm in its infrarenal segment. Due to arterial scan timing, no venous opacification to assess for thrombus.  Review of the MIP images confirms the above findings.  NON-VASCULAR  Lower chest: No pleural or pericardial effusion. Visualized lung bases clear.  Hepatobiliary: No focal liver abnormality is seen. No gallstones, gallbladder wall thickening, or biliary dilatation.  Pancreas: Unremarkable. No pancreatic ductal dilatation or surrounding inflammatory changes.  Spleen: Normal in size.  Single small calcified granuloma.  Adrenals/Urinary Tract: Adrenal glands unremarkable. Left kidney normal. There is massive right hydronephrosis and proximal ureterectasis with advanced atrophy of the renal parenchyma, only a thin rim of tissue evident. The right ureterectasis terminates at the level of a right retroperitoneal soft tissue mass containing coarse calcifications process abutting the right common iliac vein and distal IVC. Distal right ureter decompressed. Urinary bladder nondistended, mildly thick-walled. Thick-walled urachal remnant with soft tissue nodularity containing 2 foci of calcification.  Stomach/Bowel: Stomach is partially distended by ingested fluid. The small bowel is nondistended. normal appendix. The colon  is decompressed, unremarkable.  Lymphatic: Multiple prominent subcentimeter right external iliac, bilateral common iliac, left para-aortic and aortocaval lymph nodes. No mesenteric adenopathy.  Reproductive: Prostate is unremarkable.  Other: No ascites.  No free air.  Musculoskeletal: Soft tissue mass containing coarse calcifications extending over a craniocaudal length of 7.2 cm, abutting right  common iliac vein and ilio caval confluence, in corresponding to transition point of proximal right ureterectasis. Left knee medial compartment DJD. No fracture or worrisome bone lesion.  IMPRESSION: 1. No aortic dissection, occlusion or stenosis. 2. Asymmetric flow to lower extremities left greater than right, limiting evaluation of distal right popliteal artery and runoff such that acute embolus cannot be confidently excluded. 3. Dilated left common iliac vein and iliocaval confluence up to 3.2 cm diameter, with tapered narrowing of the IVC to 1.4 cm in its infrarenal segment. Given the clinical history, bilateral lower extremity venous ultrasound may be useful for further evaluation. 4. Right retroperitoneal soft tissue mass containing coarse calcifications, abutting the right common iliac vein and distal IVC. 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 MR may be useful for better soft tissue characterization. 5. Thick-walled urachal remnant with soft tissue nodularity containing 2 foci of calcification. Recommend urologic consultation.  ECHO 06/05/2020 IMPRESSIONS    1. Left ventricular ejection fraction, by estimation, is 60 to 65%. The  left ventricle has normal function. The left ventricle has no regional  wall motion abnormalities. There is mild left ventricular hypertrophy.  Left ventricular diastolic parameters  are consistent with Grade I diastolic dysfunction  (impaired relaxation).  2. Right ventricular systolic function is normal. The right ventricular  size is normal.   Sinus tachycardia 132 on monitor Mag level 1.6 with am labs. Replacement ordered K normal Creat 1.57 (unknown baseline)  Patient reports symptoms of right lowe  This was accompanied by runny nose as well. Fever denied. He and wife (present during interview) thought pain was sciatic in nature. Used ibuprofen and "icy Hot" rub/message.  Pain did improve but returned, very sere with inability to bare weight on legs which prompted EMS call and ER visit  Patient denies history of palpitation, paroxysmal dyspnea, hypertension chest pain, LUTS, or problems with lower extremity edema in past.  He has not had trauma to area. He denies trauma with repetitive motion with his job.   Prior surgeries include T & A  (strept throat infection) 8 years ago  IV fluids discontinued, will defer initiation of diuretic to attending Assume nephrology and urology consults planned with hospitalist services assumeing care of atient  Heart rate slightly improved after 5 mg IV metoprolol MRI ordered fto further evaluate soft tissue mass

## 2020-06-06 NOTE — Progress Notes (Signed)
PT Cancellation Note  Patient Details Name: Austin Glover MRN: [id number] DOB: 1981/06/11   Cancelled Treatment:    Reason Eval/Treat Not Completed: Patient not medically ready (Pateint found to have two occlusive DVTs and thrombectomy planned for Monday. Argatroban drip to start at 1800 per pharmacy documentation. PT contraindicated at this time. Will continue to monitor and re-attempt eval at later time/date when patient is medically appropriate.)   Luretha Murphy. Ilsa Iha, PT, DPT 06/06/20, 4:35 PM

## 2020-06-06 NOTE — Progress Notes (Signed)
ANTICOAGULATION CONSULT NOTE  Pharmacy Consult for argatroban Indication: suspected DVT  No Known Allergies  Patient Measurements: Height: 5\' 11"  (180.3 cm) Weight: 115 kg (253 lb 8.5 oz) IBW/kg (Calculated) : 75.3 Heparin Dosing Weight: 97.2 kg  Vital Signs: Temp: 98.5 F (36.9 C) (07/17 1229) Temp Source: Oral (07/17 1229) BP: 117/83 (07/17 1229) Pulse Rate: 108 (07/17 1240)  Labs: Recent Labs    06/04/20 0756 06/04/20 0756 06/04/20 1018 06/04/20 1820 06/05/20 0521 06/05/20 0521 06/05/20 2339 06/06/20 0351 06/06/20 1311  HGB 16.0   < >  --   --  15.0   < >  --  12.8* 12.4*  HCT 47.4   < >  --   --  42.5  --   --  36.4* 35.2*  PLT 178   < >  --   --  124*  --   --  84* 84*  APTT  --   --  31  --   --   --   --   --   --   LABPROT  --   --  15.1  --   --   --   --   --   --   INR  --   --  1.2  --   --   --   --   --   --   HEPARINUNFRC  --   --   --  0.60  --   --   --   --   --   CREATININE 1.89*   < >  --   --  1.57*  --  1.62* 1.45*  --   CKTOTAL 175  --   --   --   --   --   --  801*  --    < > = values in this interval not displayed.    Estimated Creatinine Clearance: 90 mL/min (A) (by C-G formula based on SCr of 1.45 mg/dL (H)).   Assessment: Patient is a 39yo male admitted with limb ischemia. Pharmacy consulted for Heparin dosing. No prior anticoagulants noted. Patient with previous angiogram with bleeding to femoral site 7/15. Heparin stopped at that time. Hg/plt have trended down. Pharmacy re-consulted for heparin dosing for suspected DVT.  Update 1500: Patient with confirmed bilateral occlusive DVTs. Plan for patient to have thrombectomy on Monday. Concern for HIT. Pharmacy consulted for argatroban.  Goal of Therapy:  APTT 50-90 sec Monitor platelets by anticoagulation protocol: Yes   Plan:  Start argatroban infusion at 1 mcg/kg/min. APTT at 1800. Check q2h until therapeutic x 2 then daily. CBC with morning labs.  Saturday, PharmD Clinical  Pharmacist 06/06/2020 3:04 PM

## 2020-06-06 NOTE — Progress Notes (Signed)
Notified by Radiology that patient has bilateral occlusive DVT's, .  Discussed with Dr Myra Gianotti, regarding IVC filter placement, , recommended thrombectomy on Monday.  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   Kathlen Mody, MD

## 2020-06-06 NOTE — Plan of Care (Signed)
MD order for transfer to 2A. Bed assigned 233, spoke with charge nurse who will notify this RN if can accept patient to floor due to argatroban drip.

## 2020-06-06 NOTE — Progress Notes (Signed)
PROGRESS NOTE    Denise Bramblett  DGU:[id number] DOB: 08/21/1981 DOA: 06/04/2020 PCP: Patient, No Pcp Per    Chief Complaint  Patient presents with  . Back Pain    Brief Narrative:   39 year old gentleman with no significant past medical history presented to [facility] ED for bilateral leg pain started on Monday.  He was initially admitted by vascular surgery on 06/04/2020 for evaluation of critical limb ischemia.  He underwent aortogram and bilateral lower extremity angiogram without any significant limb ischemia.  Patient was transferred to hospitalist service for evaluation of CHF and diabetes. Patient seen and examined this morning reports his bilateral lower extremity pain has improved but not completely resolved.  He denies any chest pain, shortness of breath, nausea, vomiting or abdominal pain.  He has not ambulated since 2 days.  He denies any headache dizziness, blurry vision. Upon further evaluation his CT angiogram of the abdominal aorta, and bilateral lower extremity with iliofemoral runoff shows There is massive right hydronephrosis and proximal ureterectasis with advanced atrophy of the renal parenchyma, only a thin rim of tissue evident. The right ureterectasis terminates at the level of a right retroperitoneal soft tissue mass containing coarse calcifications process abutting the right common iliac vein and distal IVC. Distal right ureter decompressed. Urinary bladder nondistended, mildly thick-walled. Thick-walled urachal remnant withsoft tissue nodularity containing 2 foci of calcification.  Assessment & Plan:   Active Problems:   Ischemia of extremity   Leg pain   Bilateral leg pain Unclear etiology..  Differential include DVT vs rhabdomyolysis vs malignancy.  Venous duplex of the lower extremity ordered.  Empirically started the patient on IV heparin but discontinued it as his platelets dropped from  178000 on admission to 84000 today. Vascular  surgery ruled out critical limb ischemia with negative angiogram of the lower extremities.      AKI: Differential include dehydration, pre renal azotemia vs obstructive causes leading to right hydronephrosis .  Get UA, urine protein, urine sodium and creatinine.  Urine output adequate.  Improving creatinine with IV fluids.  Continue to monitor.     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 HIV screen is negative.  MRI of the abdomen ordered for further evaluation.  Quantiferon Gold ordered     Right sided hydronephrosis with right ureterectasis: Urology consulted, suggested . This is a chronic process as there is no remaining renal parenchyma, so no role for ureteral stent or nephrostomy tube Recommended outpatient follow up with him in the clinic after discharge.     Mild leukocytosis:  Suspect reactive. Normalized today.    Thrombocytopenia:  Suspect from the heparin used on the day of admission vs ?  Will hold off heparin for now.  HIT ordered.    Mild anemia of blood loss from the procedure:  RN Reported that patient had blood loss after the aortogram from the site.  Continue to monitor.    Hypomagnesemia:  Replaced.    Tachycardia:  Unclear etiology. ? Leg pain vs dehydration vs malignancy .   EKG shows sinus tachycardia and t wave inversion in lead II, III AVF and lateral leads.  Pt denies chest pain or sob, palpitations.  He was empirically started on aspirin and prn metoprolol.  He remains asymptomatic.   Echocardiogram shows Left ventricular ejection fraction, by estimation, is 60 to 65%. The  left ventricle has normal function. The left ventricle has no regional  wall  motion abnormalities. There is mild left ventricular hypertrophy.  Left ventricular diastolic parameters  are consistent with Grade I diastolic dysfunction (impaired relaxation). No valve abnormalities.     DVT  prophylaxis: Heparin.  Code Status: full code.  Family Communication:family at bedside  Disposition:   Status is: Inpatient  Remains inpatient appropriate because:Hemodynamically unstable and Ongoing diagnostic testing needed not appropriate for outpatient work up   Dispo: The patient is from: Home              Anticipated d/c is to: pending.               Anticipated d/c date is: 2 days              Patient currently is not medically stable to d/c.       Consultants:   Urology  Vascular surgery     Procedures:  .           Ultrasound guidance for vascular access left femoral artery  Catheter placement into right SFA from left femoral approach  Aortogram and selective bilateral lower extremity angiogram  StarClose closure device left femoral artery   Antimicrobials: none.    Subjective: Bilateral leg pain improved.   Objective: Vitals:   06/06/20 0002 06/06/20 0342 06/06/20 1229 06/06/20 1240  BP: 125/86 107/81 117/83   Pulse: (!) 101 (!) 108 (!) 127 (!) 114  Resp: 16 19 20 20   Temp: 98.9 F (37.2 C) 98.4 F (36.9 C) 98.5 F (36.9 C)   TempSrc: Oral Oral Oral   SpO2: 97% 97% 96% 96%  Weight:  115 kg    Height:        Intake/Output Summary (Last 24 hours) at 06/06/2020 1306 Last data filed at 06/06/2020 0900 Gross per 24 hour  Intake 2091.87 ml  Output 1470 ml  Net 621.87 ml   Filed Weights   06/04/20 0710 06/06/20 0342  Weight: 104.3 kg 115 kg    Examination:  General exam: Appears calm and comfortable  Respiratory system: Clear to auscultation. Respiratory effort normal. Cardiovascular system: S1 & S2 heard, RRR. No JVD, Gastrointestinal system: Abdomen is nondistended, soft and nontender.  Normal bowel sounds heard. Central nervous system: Alert and oriented. No focal neurological deficits. Extremities: bilateral leg edema present Skin: No rashes seen.  Psychiatry: Mood & affect appropriate.      Data Reviewed: I have personally reviewed following labs and imaging studies  CBC: Recent Labs  Lab 06/04/20 0756 06/05/20 0521 06/06/20 0351  WBC 18.9* 11.5* 9.4  NEUTROABS 14.0*  --   --   HGB 16.0 15.0 12.8*  HCT 47.4 42.5 36.4*  MCV 88.3 86.4 85.6  PLT 178 124* 84*    Basic Metabolic Panel: Recent Labs  Lab 06/04/20 0756 06/05/20 0521 06/05/20 2339 06/06/20 0351  NA 137 137 131* 132*  K 4.0 4.7 4.2 4.0  CL 107 109 102 104  CO2 10* 19* 23 22  GLUCOSE 259* 131* 118* 120*  BUN 22* 21* 19 17  CREATININE 1.89* 1.57* 1.62* 1.45*  CALCIUM 8.7* 8.4* 8.5* 8.5*  MG  --  1.6*  --  2.1    GFR: Estimated Creatinine Clearance: 90 mL/min (A) (by C-G formula based on SCr of 1.45 mg/dL (H)).  Liver Function Tests: Recent Labs  Lab 06/04/20 0756 06/06/20 0351  AST 32 32  ALT 33 24  ALKPHOS 90 61  BILITOT 1.2 1.1  PROT 8.2* 6.3*  ALBUMIN 4.5 3.4*    CBG:  Recent Labs  Lab 06/04/20 1901 06/06/20 0741 06/06/20 1128  GLUCAP 127* 110* 106*     Recent Results (from the past 240 hour(s))  Blood culture (routine x 2)     Status: None (Preliminary result)   Collection Time: 06/04/20  9:50 AM   Specimen: BLOOD  Result Value Ref Range Status   Specimen Description BLOOD LEFT AC  Final   Special Requests   Final    BOTTLES DRAWN AEROBIC AND ANAEROBIC Blood Culture adequate volume   Culture   Final    NO GROWTH 2 DAYS Performed at [facility], [address], [place], [place] [address]    Report Status PENDING  Incomplete  Blood culture (routine x 2)     Status: None (Preliminary result)   Collection Time: 06/04/20  9:50 AM   Specimen: BLOOD  Result Value Ref Range Status   Specimen Description BLOOD RIGHT HAND  Final   Special Requests   Final    BOTTLES DRAWN AEROBIC AND ANAEROBIC Blood Culture adequate volume   Culture   Final    NO GROWTH 2 DAYS Performed at [facility], [address], [place], [place] [address]    Report  Status PENDING  Incomplete  SARS Coronavirus 2 by RT PCR (hospital order, performed in [facility] hospital lab) Nasopharyngeal Nasopharyngeal Swab     Status: None   Collection Time: 06/04/20 10:18 AM   Specimen: Nasopharyngeal Swab  Result Value Ref Range Status   SARS Coronavirus 2 NEGATIVE NEGATIVE Final    Comment: (NOTE) SARS-CoV-2 target nucleic acids are NOT DETECTED.  The SARS-CoV-2 RNA is generally detectable in upper and lower respiratory specimens during the acute phase of infection. The lowest concentration of SARS-CoV-2 viral copies this assay can detect is 250 copies / mL. A negative result does not preclude SARS-CoV-2 infection and should not be used as the sole basis for treatment or other patient management decisions.  A negative result may occur with improper specimen collection / handling, submission of specimen other than nasopharyngeal swab, presence of viral mutation(s) within the areas targeted by this assay, and inadequate number of viral copies (<250 copies / mL). A negative result must be combined with clinical observations, patient history, and epidemiological information.  Fact Sheet for Patients:   BoilerBrush.com.cy  Fact Sheet for Healthcare Providers: https://pope.com/  This test is not yet approved or  cleared by the [place] FDA and has been authorized for detection and/or diagnosis of SARS-CoV-2 by FDA under an Emergency Use Authorization (EUA).  This EUA will remain in effect (meaning this test can be used) for the duration of the COVID-19 declaration under Section 564(b)(1) of the Act, 21 U.S.C. section 360bbb-3(b)(1), unless the authorization is terminated or revoked sooner.  Performed at [facility], [address] [place], [place] [address]          Radiology Studies: PERIPHERAL VASCULAR CATHETERIZATION  Result Date: 06/04/2020 See op note  DG C-Arm 1-60 Min-No  Report  Result Date: 06/04/2020 Fluoroscopy was utilized by the requesting physician.  No radiographic interpretation.   ECHOCARDIOGRAM COMPLETE  Result Date: 06/05/2020    ECHOCARDIOGRAM REPORT   Patient Name:   Deetta Perla Date of Exam: 06/05/2020 Medical Rec #:  [id number]    Height:       71.0 in Accession #:    [id number]   Weight:       230.0 lb Date of Birth:  January 29, 1981    BSA:  2.238 m Patient Age:    39 years     BP:           134/95 mmHg Patient Gender: M            HR:           110 bpm. Exam Location:  [facility] Procedure: 2D Echo, Cardiac Doppler and Color Doppler Indications:     CHF- acute systolic 428.21  History:         Patient has no prior history of Echocardiogram examinations. No                  medical history on file.  Sonographer:     Cristela Blue RDCS (AE) Referring Phys:  [id number] Tonette Lederer Diagnosing Phys: Julien Nordmann MD IMPRESSIONS  1. Left ventricular ejection fraction, by estimation, is 60 to 65%. The left ventricle has normal function. The left ventricle has no regional wall motion abnormalities. There is mild left ventricular hypertrophy. Left ventricular diastolic parameters are consistent with Grade I diastolic dysfunction (impaired relaxation).  2. Right ventricular systolic function is normal. The right ventricular size is normal. FINDINGS  Left Ventricle: Left ventricular ejection fraction, by estimation, is 60 to 65%. The left ventricle has normal function. The left ventricle has no regional wall motion abnormalities. The left ventricular internal cavity size was normal in size. There is  mild left ventricular hypertrophy. Left ventricular diastolic parameters are consistent with Grade I diastolic dysfunction (impaired relaxation). Right Ventricle: The right ventricular size is normal. No increase in right ventricular wall thickness. Right ventricular systolic function is normal. Left Atrium: Left atrial size was normal in size. Right Atrium: Right atrial  size was normal in size. Pericardium: There is no evidence of pericardial effusion. Mitral Valve: The mitral valve is normal in structure. Normal mobility of the mitral valve leaflets. No evidence of mitral valve regurgitation. No evidence of mitral valve stenosis. Tricuspid Valve: The tricuspid valve is normal in structure. Tricuspid valve regurgitation is not demonstrated. No evidence of tricuspid stenosis. Aortic Valve: The aortic valve is normal in structure. Aortic valve regurgitation is not visualized. No aortic stenosis is present. Aortic valve mean gradient measures 8.0 mmHg. Aortic valve peak gradient measures 14.4 mmHg. Aortic valve area, by VTI measures 2.40 cm. Pulmonic Valve: The pulmonic valve was normal in structure. Pulmonic valve regurgitation is not visualized. No evidence of pulmonic stenosis. Aorta: The aortic root is normal in size and structure. Venous: The inferior vena cava is normal in size with greater than 50% respiratory variability, suggesting right atrial pressure of 3 mmHg. IAS/Shunts: No atrial level shunt detected by color flow Doppler.  LEFT VENTRICLE PLAX 2D LVIDd:         3.21 cm  Diastology LVIDs:         2.02 cm  LV e' lateral:   8.49 cm/s LV PW:         1.31 cm  LV E/e' lateral: 6.1 LV IVS:        1.40 cm  LV e' medial:    8.16 cm/s LVOT diam:     2.00 cm  LV E/e' medial:  6.3 LV SV:         77 LV SV Index:   34 LVOT Area:     3.14 cm  RIGHT VENTRICLE RV S prime:     23.70 cm/s TAPSE (M-mode): 2.6 cm LEFT ATRIUM             Index  RIGHT ATRIUM           Index LA diam:        2.80 cm 1.25 cm/m  RA Area:     13.70 cm LA Vol (A2C):   34.2 ml 15.26 ml/m RA Volume:   29.60 ml  13.23 ml/m LA Vol (A4C):   53.2 ml 23.78 ml/m LA Biplane Vol: 47.0 ml 21.00 ml/m  AORTIC VALVE                    PULMONIC VALVE AV Area (Vmax):    2.14 cm     PV Vmax:        1.11 m/s AV Area (Vmean):   2.31 cm     PV Peak grad:   4.9 mmHg AV Area (VTI):     2.40 cm     RVOT Peak grad: 7 mmHg  AV Vmax:           189.50 cm/s AV Vmean:          130.500 cm/s AV VTI:            0.320 m AV Peak Grad:      14.4 mmHg AV Mean Grad:      8.0 mmHg LVOT Vmax:         129.00 cm/s LVOT Vmean:        96.100 cm/s LVOT VTI:          0.245 m LVOT/AV VTI ratio: 0.76  AORTA Ao Root diam: 3.10 cm MITRAL VALVE MV Area (PHT): 4.10 cm    SHUNTS MV Decel Time: 185 msec    Systemic VTI:  0.24 m MV E velocity: 51.40 cm/s  Systemic Diam: 2.00 cm MV A velocity: 82.00 cm/s MV E/A ratio:  0.63 Julien Nordmannimothy Gollan MD Electronically signed by Julien Nordmannimothy Gollan MD Signature Date/Time: 06/05/2020/6:38:49 PM    Final         Scheduled Meds: . insulin aspart  0-15 Units Subcutaneous TID AC & HS   Continuous Infusions: . sodium chloride 100 mL/hr at 06/06/20 1304     LOS: 1 day        Kathlen ModyVijaya Christiann Hagerty, MD [facility]   To contact the attending provider between 7A-7P or the covering provider during after hours 7P-7A, please log into the web site [email or web address] and access using universal [place] password for that web site. If you do not have the password, please call the hospital operator.  06/06/2020, 1:06 PM

## 2020-06-06 NOTE — Progress Notes (Addendum)
Pt transferred to room 242. Pt is complaining of back pain and headache. Back was treated prior to transfer and MD notified for tylenol order. RN consulted with pharmacy prior to start of argatroban gtt at 6.9. I will continue to assess.  Mews is yellow for the patient's HR. This is not an acute change. I will continue to assess.

## 2020-06-07 ENCOUNTER — Encounter: Payer: Self-pay | Admitting: Vascular Surgery

## 2020-06-07 ENCOUNTER — Inpatient Hospital Stay: Payer: Self-pay

## 2020-06-07 LAB — GLUCOSE, CAPILLARY
Glucose-Capillary: 103 mg/dL — ABNORMAL HIGH (ref 70–99)
Glucose-Capillary: 123 mg/dL — ABNORMAL HIGH (ref 70–99)
Glucose-Capillary: 111 mg/dL — ABNORMAL HIGH (ref 70–99)

## 2020-06-07 LAB — CBC
HCT: 34.3 % — ABNORMAL LOW (ref 39.0–52.0)
Hemoglobin: 11.5 g/dL — ABNORMAL LOW (ref 13.0–17.0)
MCH: 29.9 pg (ref 26.0–34.0)
MCHC: 33.5 g/dL (ref 30.0–36.0)
MCV: 89.3 fL (ref 80.0–100.0)
Platelets: 85 10*3/uL — ABNORMAL LOW (ref 150–400)
RBC: 3.84 MIL/uL — ABNORMAL LOW (ref 4.22–5.81)
RDW: 11.9 % (ref 11.5–15.5)
WBC: 8.3 10*3/uL (ref 4.0–10.5)
nRBC: 0 % (ref 0.0–0.2)

## 2020-06-07 LAB — BASIC METABOLIC PANEL
Anion gap: 5 (ref 5–15)
BUN: 14 mg/dL (ref 6–20)
CO2: 25 mmol/L (ref 22–32)
Calcium: 8 mg/dL — ABNORMAL LOW (ref 8.9–10.3)
Chloride: 104 mmol/L (ref 98–111)
Creatinine, Ser: 1.11 mg/dL (ref 0.61–1.24)
GFR calc Af Amer: >60 mL/min (ref >60)
GFR calc non Af Amer: >60 mL/min (ref >60)
Glucose, Bld: 117 mg/dL — ABNORMAL HIGH (ref 70–99)
Potassium: 4 mmol/L (ref 3.5–5.1)
Sodium: 134 mmol/L — ABNORMAL LOW (ref 135–145)

## 2020-06-07 LAB — APTT: aPTT: 87 seconds — ABNORMAL HIGH (ref 24–36)

## 2020-06-07 LAB — ANTISTREPTOLYSIN O TITER: ASO: 101 IU/mL (ref 0.0–200.0)

## 2020-06-07 LAB — ANTITHROMBIN III: AntiThromb III Func: 101 % (ref 75–120)

## 2020-06-07 LAB — HEPARIN INDUCED PLATELET AB (HIT ANTIBODY): Heparin Induced Plt Ab: 0.052 OD (ref 0.000–0.400)

## 2020-06-07 IMAGING — CT CT ANGIO CHEST
2 of 6 series · 19 of 46 positions shown · IV contrast (APPLIED)
Comparison: None.

CLINICAL DATA: Known bilateral DVT's.  Question pulmonary embolus.

EXAM:
CT ANGIOGRAPHY CHEST WITH CONTRAST
TECHNIQUE: Multidetector CT imaging of the chest was performed using the
standard protocol during bolus administration of intravenous
contrast. Multiplanar CT image reconstructions and MIPs were
obtained to evaluate the vascular anatomy.
CONTRAST:  75 mL OMNIPAQUE IOHEXOL 350 MG/ML SOLN

[Series 5: thins · axial · 0.74mm/px · z∈[-391,-116]mm · 16 of 303 slices shown]
[im 14/303  lung]
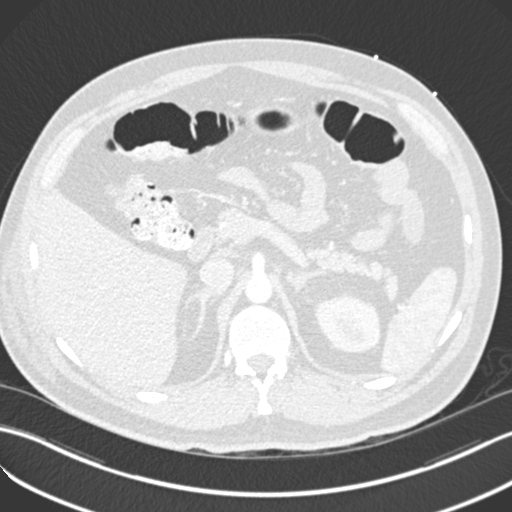
[im 40/303  soft-tissue]
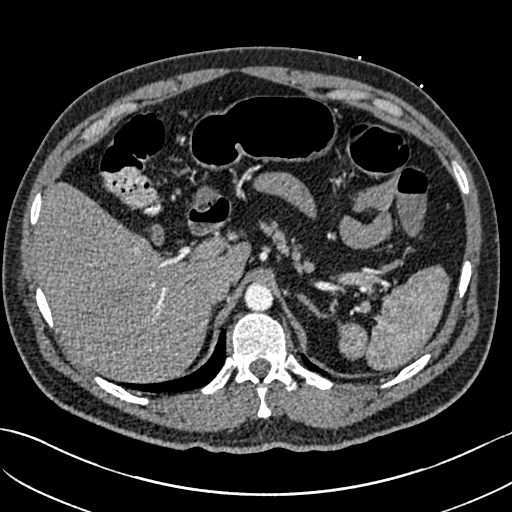
[im 53/303  lung]
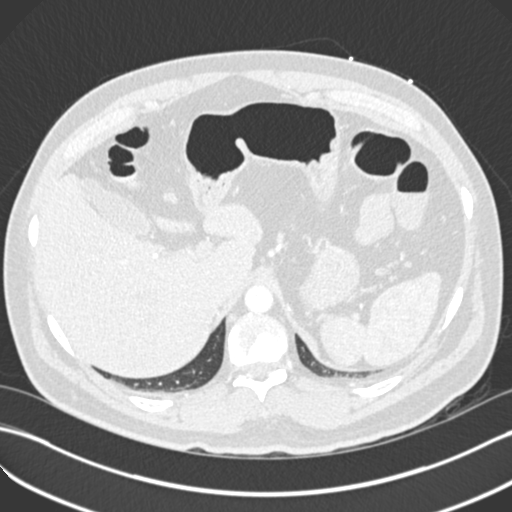
[im 66/303  soft-tissue]
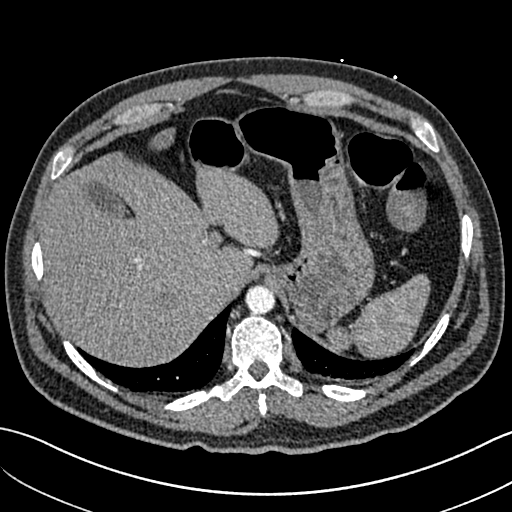
[im 92/303  lung]
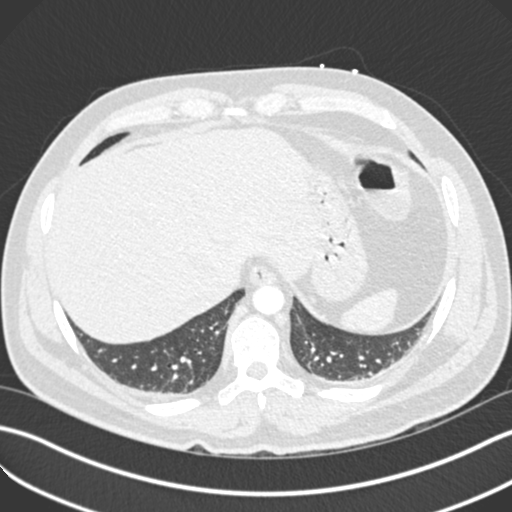
[im 106/303  soft-tissue]
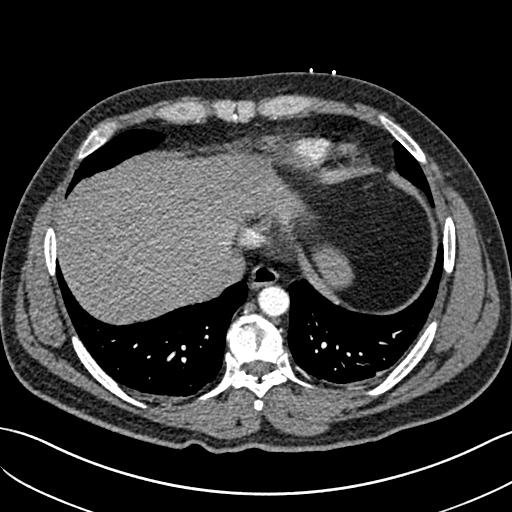
[im 119/303  lung]
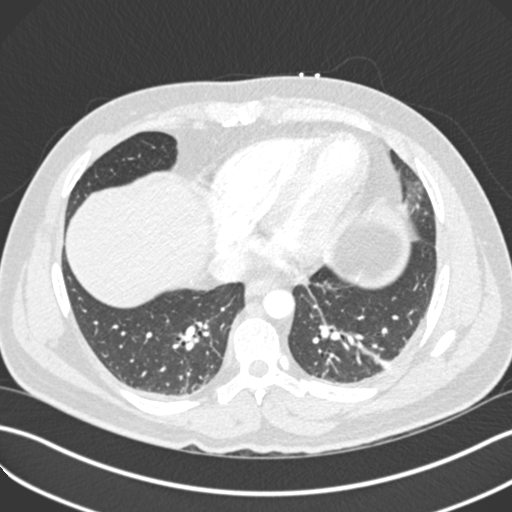
[im 145/303  soft-tissue]
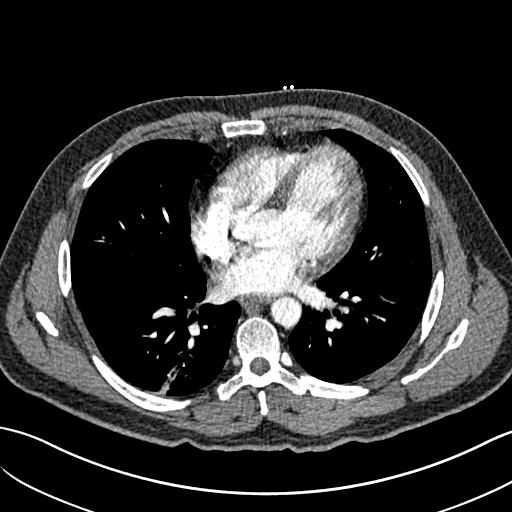
[im 158/303  lung]
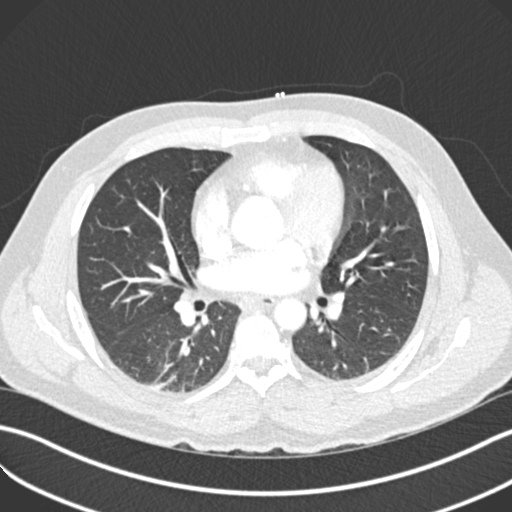
[im 184/303  soft-tissue]
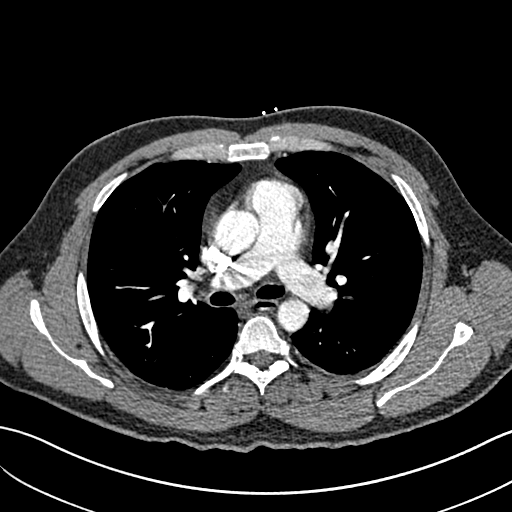
[im 197/303  lung]
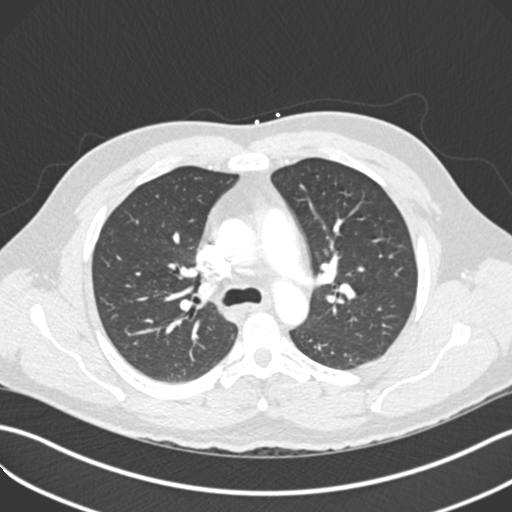
[im 211/303  soft-tissue]
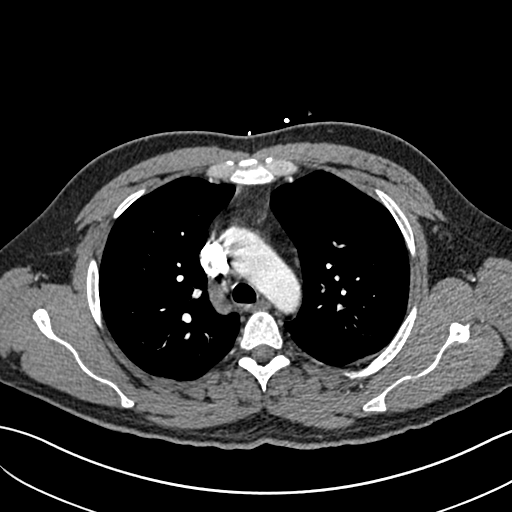
[im 237/303  lung]
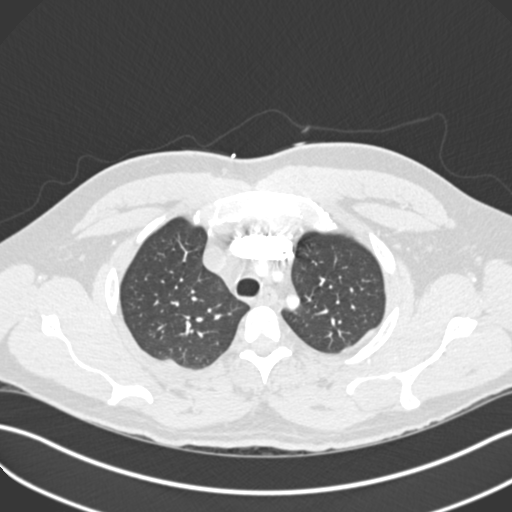
[im 250/303  soft-tissue]
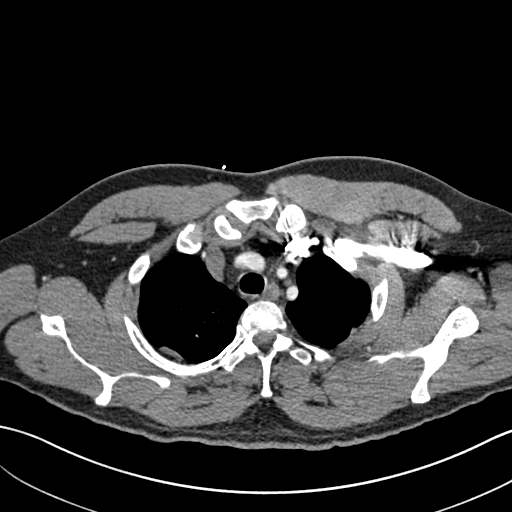
[im 263/303  lung]
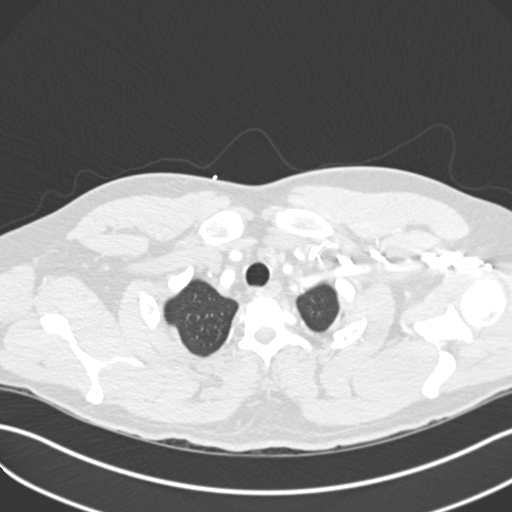
[im 289/303  soft-tissue]
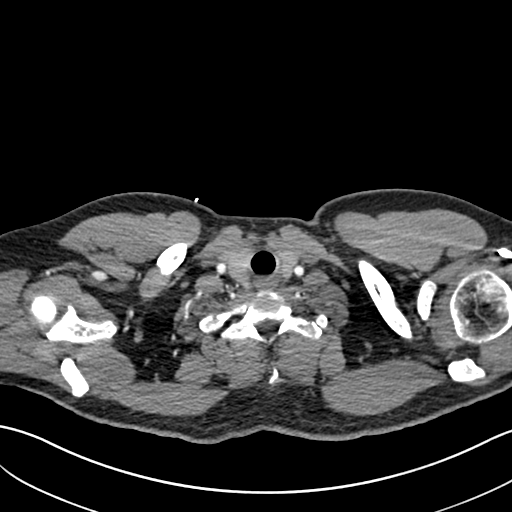

[Series 7: coronal mpr · coronal · 0.59mm/px · 3 of 101 slices shown]
[im 26/101  soft-tissue]
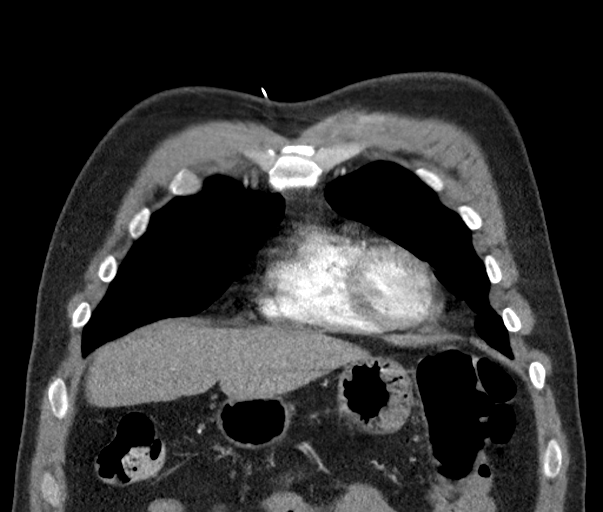
[im 51/101  soft-tissue]
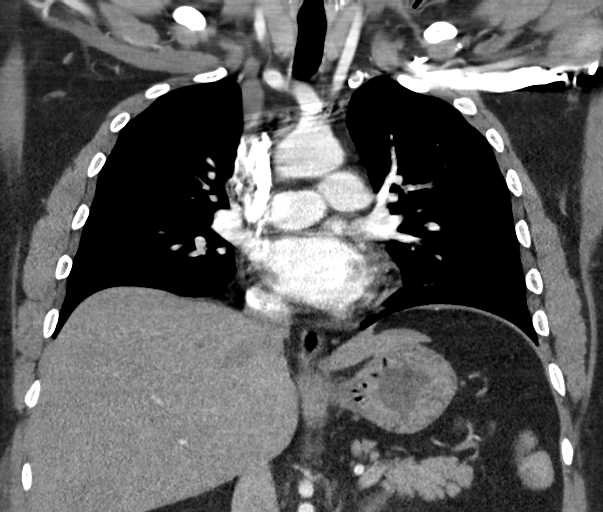
[im 76/101  soft-tissue]
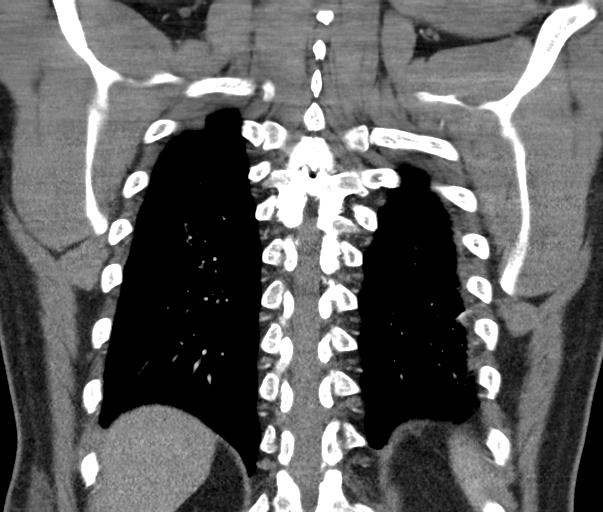

[19 of 46 positions shown; findings below may reference images not displayed]

FINDINGS: Cardiovascular: Satisfactory opacification of the pulmonary arteries
to the segmental level. No evidence of pulmonary embolism. Normal
heart size. No pericardial effusion.

Mediastinum/Nodes: No enlarged mediastinal, hilar, or axillary lymph
nodes. Thyroid gland, trachea, and esophagus demonstrate no
significant findings.

Lungs/Pleura: No pleural effusion.  Mild dependent atelectasis.

Upper Abdomen: Negative.

Musculoskeletal: Negative.

Review of the MIP images confirms the above findings.
IMPRESSION: Negative for pulmonary embolus.  Negative chest CT.

## 2020-06-07 MED ORDER — IOHEXOL 300 MG/ML  SOLN: 75.0000 mL | Freq: Once | INTRAMUSCULAR | Status: DC | PRN

## 2020-06-07 MED ORDER — IOHEXOL 350 MG/ML SOLN
75.0000 mL | Freq: Once | INTRAVENOUS | Status: AC | PRN
  Administered 2020-06-07: 75 mL via INTRAVENOUS

## 2020-06-07 MED ORDER — SIMETHICONE 80 MG PO CHEW
80.0000 mg | CHEWABLE_TABLET | Freq: Four times a day (QID) | ORAL | Status: DC | PRN
  Administered 2020-06-07 (×2): 80 mg via ORAL
  Filled 2020-06-07 (×5): qty 1

## 2020-06-07 MED ORDER — PANTOPRAZOLE SODIUM 40 MG PO TBEC
40.0000 mg | DELAYED_RELEASE_TABLET | Freq: Every day | ORAL | Status: DC
  Administered 2020-06-07 – 2020-06-16 (×9): 40 mg via ORAL
  Filled 2020-06-07 (×9): qty 1

## 2020-06-07 NOTE — Progress Notes (Signed)
ANTICOAGULATION CONSULT NOTE  Pharmacy Consult for argatroban Indication: suspected DVT  No Known Allergies  Patient Measurements: Height: 5\' 11"  (180.3 cm) Weight: 116.1 kg (256 lb) IBW/kg (Calculated) : 75.3 Heparin Dosing Weight: 97.2 kg  Vital Signs: Temp: 98.6 F (37 C) (07/18 0354) Temp Source: Oral (07/18 0354) BP: 125/82 (07/18 0354) Pulse Rate: 108 (07/18 0354)  Labs: Recent Labs    06/04/20 0756 06/04/20 1018 06/04/20 1018 06/04/20 1820 06/05/20 0521 06/05/20 2339 06/06/20 0351 06/06/20 0351 06/06/20 1311 06/06/20 2000 06/06/20 2246 06/07/20 0439  HGB 16.0  --   --   --    < >  --  12.8*   < > 12.4*  --   --  11.5*  HCT 47.4  --   --   --    < >  --  36.4*  --  35.2*  --   --  34.3*  PLT 178  --   --   --    < >  --  84*  --  84*  --   --  85*  APTT  --  31   < >  --   --   --   --   --   --  70* 79* 87*  LABPROT  --  15.1  --   --   --   --   --   --   --   --   --   --   INR  --  1.2  --   --   --   --   --   --   --   --   --   --   HEPARINUNFRC  --   --   --  0.60  --   --   --   --   --   --   --   --   CREATININE 1.89*  --   --   --    < > 1.62* 1.45*  --   --   --   --  1.11  CKTOTAL 175  --   --   --   --   --  801*  --   --   --   --   --    < > = values in this interval not displayed.    Estimated Creatinine Clearance: 118.1 mL/min (by C-G formula based on SCr of 1.11 mg/dL).   Assessment: Patient is a 39yo male admitted with limb ischemia. Pharmacy consulted for Heparin dosing. No prior anticoagulants noted. Patient with previous angiogram with bleeding to femoral site 7/15. Heparin stopped at that time. Hg/plt have trended down. Pharmacy re-consulted for heparin dosing for suspected DVT.  Update 1500: Patient with confirmed bilateral occlusive DVTs. Plan for patient to have thrombectomy on Monday. Concern for HIT. Pharmacy consulted for argatroban.  Goal of Therapy:  APTT 50-90 sec Monitor platelets by anticoagulation protocol: Yes    Plan:  07/18 @ 0500 aPTT 87 seconds therapeutic. Will continue current rate of argatroban and recheck aPTT w/ am labs and continue to monitor.  8/18, PharmD, BCPS Clinical Pharmacist 06/07/2020 6:37 AM

## 2020-06-07 NOTE — Progress Notes (Signed)
ANTICOAGULATION CONSULT NOTE  Pharmacy Consult for argatroban Indication: suspected DVT  No Known Allergies  Patient Measurements: Height: 5\' 11"  (180.3 cm) Weight: 115 kg (253 lb 8.5 oz) IBW/kg (Calculated) : 75.3 Heparin Dosing Weight: 97.2 kg  Vital Signs: Temp: 98.2 F (36.8 C) (07/17 1929) Temp Source: Oral (07/17 1929) BP: 119/83 (07/17 1929) Pulse Rate: 113 (07/17 1929)  Labs: Recent Labs    06/04/20 0756 06/04/20 0756 06/04/20 1018 06/04/20 1820 06/05/20 0521 06/05/20 0521 06/05/20 2339 06/06/20 0351 06/06/20 1311 06/06/20 2000 06/06/20 2246  HGB 16.0   < >  --   --  15.0   < >  --  12.8* 12.4*  --   --   HCT 47.4   < >  --   --  42.5  --   --  36.4* 35.2*  --   --   PLT 178   < >  --   --  124*  --   --  84* 84*  --   --   APTT  --   --  31  --   --   --   --   --   --  70* 79*  LABPROT  --   --  15.1  --   --   --   --   --   --   --   --   INR  --   --  1.2  --   --   --   --   --   --   --   --   HEPARINUNFRC  --   --   --  0.60  --   --   --   --   --   --   --   CREATININE 1.89*   < >  --   --  1.57*  --  1.62* 1.45*  --   --   --   CKTOTAL 175  --   --   --   --   --   --  801*  --   --   --    < > = values in this interval not displayed.    Estimated Creatinine Clearance: 90 mL/min (A) (by C-G formula based on SCr of 1.45 mg/dL (H)).   Assessment: Patient is a 39yo male admitted with limb ischemia. Pharmacy consulted for Heparin dosing. No prior anticoagulants noted. Patient with previous angiogram with bleeding to femoral site 7/15. Heparin stopped at that time. Hg/plt have trended down. Pharmacy re-consulted for heparin dosing for suspected DVT.  Update 1500: Patient with confirmed bilateral occlusive DVTs. Plan for patient to have thrombectomy on Monday. Concern for HIT. Pharmacy consulted for argatroban.  Goal of Therapy:  APTT 50-90 sec Monitor platelets by anticoagulation protocol: Yes   Plan:  07/17 @ 2300 aPTT 79 seconds therapeutic.  Will continue current rate of argatroban and recheck aPTT w/ am labs and continue to monitor.  8/17, PharmD, BCPS Clinical Pharmacist 06/07/2020 1:03 AM

## 2020-06-07 NOTE — Progress Notes (Signed)
PROGRESS NOTE    Austin Glover  JHE:[id number] DOB: 02-Mar-1981 DOA: 06/04/2020 PCP: Patient, No Pcp Per    Chief Complaint  Patient presents with   Back Pain    Brief Narrative:   39 year old gentleman with no significant past medical history presented to [facility] ED for bilateral leg pain started on Monday.  He was initially admitted by vascular surgery on 06/04/2020 for evaluation of critical limb ischemia.  He underwent aortogram and bilateral lower extremity angiogram without any significant limb ischemia.  Patient was transferred to hospitalist service for evaluation of CHF and diabetes. Patient seen and examined this morning reports his bilateral lower extremity pain has improved but not completely resolved.  He denies any chest pain, shortness of breath, nausea, vomiting or abdominal pain.  He has not ambulated since 2 days.  He denies any headache dizziness, blurry vision. Upon further evaluation his CT angiogram of the abdominal aorta, and bilateral lower extremity with iliofemoral runoff shows There is massive right hydronephrosis and proximal ureterectasis with advanced atrophy of the renal parenchyma, only a thin rim of tissue evident. The right ureterectasis terminates at the level of a right retroperitoneal soft tissue mass containing coarse calcifications process abutting the right common iliac vein and distal IVC. Distal right ureter decompressed. Urinary bladder nondistended, mildly thick-walled. Thick-walled urachal remnant withsoft tissue nodularity containing 2 foci of calcification.  Assessment & Plan:   Active Problems:   Ischemia of extremity   Leg pain   Bilateral leg pain Probably secondary to bilateral occlusive DVTs.  Venous duplex of the lower extremities showed Bilateral occlusive deep vein thrombus extending from the common femoral veins through the visualized calf veins. Vascular surgery ruled out critical limb ischemia with negative  angiogram of the lower extremities.      AKI: Differential include dehydration, pre renal azotemia vs obstructive causes leading to right hydronephrosis .  On admission patient's creatinine was around 1.8 improved to 1.1 today Urine output adequate.  Creatinine improved to 1.1 with IV Fluids.  Continue to monitor.     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 HIV screen is negative.  MRI of the abdomen ordered for further evaluation. 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  Quantiferon Gold ordered, but the sample will be picked up tomorrow.     Right sided hydronephrosis with right ureterectasis: Urology consulted, suggested, This is a chronic process as there is no remaining renal parenchyma, so no role for ureteral stent or nephrostomy tube Recommended outpatient follow up with him in the clinic after discharge.  His renal parameters have improved with IV fluids.    Mild leukocytosis:  Suspect reactive. Normalized today.    Thrombocytopenia:  Suspect from the heparin used on the day of admission vs ?  HIT ordered.  Argatroban gtt ordered for DVT.    Mild anemia of blood loss from the procedure:  RN Reported that patient had blood loss after the aortogram from the site.  Continue to monitor.  Hemoglobin from 15 on admission to 12.8 to 12.4 to 11.5.  No signs of obvious bleeding.    Hypomagnesemia:  Replaced.    Tachycardia:  EKG shows sinus tachycardia.  Unclear etiology. ? Leg pain vs dehydration .   EKG shows sinus tachycardia and t wave inversion in lead II, III AVF and lateral leads.  Pt denies chest pain or sob, palpitations.  He was empirically  started on aspirin and prn metoprolol.  He remains asymptomatic.   Echocardiogram shows Left ventricular ejection fraction, by estimation, is 60 to 65%. The  left ventricle has normal function. The left  ventricle has no regional  wall motion abnormalities. There is mild left ventricular hypertrophy.  Left ventricular diastolic parameters  are consistent with Grade I diastolic dysfunction (impaired relaxation). No valve abnormalities.     DVT prophylaxis: Argatroban.  Code Status: full code.  Family Communication: will discuss with family when the wife gets to the bedside.  Disposition:   Status is: Inpatient  Remains inpatient appropriate because:Hemodynamically unstable and Ongoing diagnostic testing needed not appropriate for outpatient work up   Dispo: The patient is from: Home              Anticipated d/c is to: pending.               Anticipated d/c date is: 2 days              Patient currently is not medically stable to d/c.       Consultants:   Urology  Vascular surgery     Procedures:  .           Ultrasound guidance for vascular access left femoral artery  Catheter placement into right SFA from left femoral approach  Aortogram and selective bilateral lower extremity angiogram  StarClose closure device left femoral artery   Antimicrobials: none.    Subjective: Improving leg pain, reports did not have any known contact with TB, unaware of any family members having tuberculosis.   Objective: Vitals:   06/06/20 1722 06/06/20 1929 06/07/20 0354 06/07/20 0733  BP: 134/90 119/83 125/82 136/86  Pulse: (!) 111 (!) 113 (!) 108 (!) 109  Resp: Temp: 98.4 F (36.9 C) 98.2 F (36.8 C) 98.6 F (37 C) 98.7 F (37.1 C)  TempSrc: Oral Oral Oral   SpO2: 98% 96% 96% 96%  Weight:   116.1 kg   Height:        Intake/Output Summary (Last 24 hours) at 06/07/2020 1043 Last data filed at 06/07/2020 0950 Gross per 24 hour  Intake 1050.5 ml  Output 2775 ml  Net -1724.5 ml   Filed Weights   06/04/20 0710 06/06/20 0342 06/07/20 0354  Weight: 104.3 kg 115 kg 116.1 kg    Examination:  General exam: well developed  gentleman, not in any kind of distress. Respiratory system: Clear to auscultation bilaterally, no wheezing or rhonchi Cardiovascular system: S1-S2 heard, tachycardic, no JVD, leg edema present Gastrointestinal system: Abdomen is soft, nontender, nondistended, bowel sounds normal Central nervous system: Alert and oriented, grossly nonfocal Extremities: Bilateral leg edema and tenderness present Skin: No rashes seen Psychiatry: Mood is appropriate    Data Reviewed: I have personally reviewed following labs and imaging studies  CBC: Recent Labs  Lab 06/04/20 0756 06/05/20 0521 06/06/20 0351 06/06/20 1311 06/07/20 0439  WBC 18.9* 11.5* 9.4 9.3 8.3  NEUTROABS 14.0*  --   --  6.6  --   HGB 16.0 15.0 12.8* 12.4* 11.5*  HCT 47.4 42.5 36.4* 35.2* 34.3*  MCV 88.3 86.4 85.6 85.6 89.3  PLT 178 124* 84* 84* 85*    Basic Metabolic Panel: Recent Labs  Lab 06/04/20 0756 06/05/20 0521 06/05/20 2339 06/06/20 0351 06/07/20 0439  NA 137 137 131* 132* 134*  K 4.0 4.7 4.2 4.0 4.0  CL 107 109 102 104 104  CO2 10* 19* 23 22  25  GLUCOSE 259* 131* 118* 120* 117*  BUN 22* 21* 19 17 14   CREATININE 1.89* 1.57* 1.62* 1.45* 1.11  CALCIUM 8.7* 8.4* 8.5* 8.5* 8.0*  MG  --  1.6*  --  2.1  --     GFR: Estimated Creatinine Clearance: 118.1 mL/min (by C-G formula based on SCr of 1.11 mg/dL).  Liver Function Tests: Recent Labs  Lab 06/04/20 0756 06/06/20 0351  AST 32 32  ALT 33 24  ALKPHOS 90 61  BILITOT 1.2 1.1  PROT 8.2* 6.3*  ALBUMIN 4.5 3.4*    CBG: Recent Labs  Lab 06/06/20 0741 06/06/20 1128 06/06/20 1634 06/06/20 2055 06/07/20 0731  GLUCAP 110* 106* 121* 150* 103*     Recent Results (from the past 240 hour(s))  Blood culture (routine x 2)     Status: None (Preliminary result)   Collection Time: 06/04/20  9:50 AM   Specimen: BLOOD  Result Value Ref Range Status   Specimen Description BLOOD LEFT AC  Final   Special Requests   Final    BOTTLES DRAWN AEROBIC AND  ANAEROBIC Blood Culture adequate volume   Culture   Final    NO GROWTH 3 DAYS Performed at [facility], [address] [place], [place]    Report Status PENDING  Incomplete  Blood culture (routine x 2)     Status: None (Preliminary result)   Collection Time: 06/04/20  9:50 AM   Specimen: BLOOD  Result Value Ref Range Status   Specimen Description BLOOD RIGHT HAND  Final   Special Requests   Final    BOTTLES DRAWN AEROBIC AND ANAEROBIC Blood Culture adequate volume   Culture   Final    NO GROWTH 3 DAYS Performed at [facility], [address] [place], [place]    Report Status PENDING  Incomplete  SARS Coronavirus 2 by RT PCR (hospital order, performed in [facility] hospital lab) Nasopharyngeal Nasopharyngeal Swab     Status: None   Collection Time: 06/04/20 10:18 AM   Specimen: Nasopharyngeal Swab  Result Value Ref Range Status   SARS Coronavirus 2 NEGATIVE NEGATIVE Final    Comment: (NOTE) SARS-CoV-2 target nucleic acids are NOT DETECTED.  The SARS-CoV-2 RNA is generally detectable in upper and lower respiratory specimens during the acute phase of infection. The lowest concentration of SARS-CoV-2 viral copies this assay can detect is 250 copies / mL. A negative result does not preclude SARS-CoV-2 infection and should not be used as the sole basis for treatment or other patient management decisions.  A negative result may occur with improper specimen collection / handling, submission of specimen other than nasopharyngeal swab, presence of viral mutation(s) within the areas targeted by this assay, and inadequate number of viral copies (<250 copies / mL). A negative result must be combined with clinical observations, patient history, and epidemiological information.  Fact Sheet for Patients:   06/06/20  Fact Sheet for Healthcare Providers: BoilerBrush.com.cy  This test is not  yet approved or  cleared by the https://pope.com/ FDA and has been authorized for detection and/or diagnosis of SARS-CoV-2 by FDA under an Emergency Use Authorization (EUA).  This EUA will remain in effect (meaning this test can be used) for the duration of the COVID-19 declaration under Section 564(b)(1) of the Act, 21 U.S.C. section 360bbb-3(b)(1), unless the authorization is terminated or revoked sooner.  Performed at [facility], [address], [place], [place]  Radiology Studies: DG Chest 2 View  Result Date: 06/06/2020 CLINICAL DATA:  Bilateral leg pain EXAM: CHEST - 2 VIEW COMPARISON:  None. FINDINGS: The heart size and mediastinal contours are within normal limits. Both lungs are clear. The visualized skeletal structures are unremarkable. IMPRESSION: No active cardiopulmonary disease. Electronically Signed   By: Jonna Clark M.D.   On: 06/06/2020 15:59   US Venous Img Lower Bilateral (DVT)  Result Date: 06/06/2020 CLINICAL DATA:  Left calf tenderness for 4 days. EXAM: BILATERAL LOWER EXTREMITY VENOUS DOPPLER ULTRASOUND TECHNIQUE: Gray-scale sonography with graded compression, as well as color Doppler and duplex ultrasound were performed to evaluate the lower extremity deep venous systems from the level of the common femoral vein and including the common femoral, femoral, profunda femoral, popliteal and calf veins including the posterior tibial, peroneal and gastrocnemius veins when visible. The superficial great saphenous vein was also interrogated. Spectral Doppler was utilized to evaluate flow at rest and with distal augmentation maneuvers in the common femoral, femoral and popliteal veins. COMPARISON:  None. FINDINGS: RIGHT LOWER EXTREMITY There is occlusive thrombus involving the right common femoral vein, saphenofemoral junction, profundus femoral vein, femoral vein throughout its course, popliteal vein, as well as visualized calf veins. There is lack of  normal compressibility and response to augmentation. LEFT LOWER EXTREMITY Similar to the right there is occlusive thrombus involving the right common femoral vein, saphenofemoral junction, profundus femoral vein, femoral vein throughout its course, popliteal vein, as well as visualized calf veins. There is lack of normal compressibility and response to augmentation. IMPRESSION: Bilateral occlusive deep vein thrombus extending from the common femoral veins through the visualized calf veins. These results will be called to the ordering clinician or representative by the Radiologist Assistant, and communication documented in the PACS or [employer]. Electronically Signed   By: Emmaline Kluver M.D.   On: 06/06/2020 14:21   ECHOCARDIOGRAM COMPLETE  Result Date: 06/05/2020    ECHOCARDIOGRAM REPORT   Patient Name:   Deetta Perla Date of Exam: 06/05/2020 Medical Rec #:  [id number]    Height:       71.0 in Accession #:    [id number]   Weight:       230.0 lb Date of Birth:  18-Sep-1981    BSA:          2.238 m Patient Age:    39 years     BP:           134/95 mmHg Patient Gender: M            HR:           110 bpm. Exam Location:  [facility] Procedure: 2D Echo, Cardiac Doppler and Color Doppler Indications:     CHF- acute systolic 428.21  History:         Patient has no prior history of Echocardiogram examinations. No                  medical history on file.  Sonographer:     Cristela Blue RDCS (AE) Referring Phys:  [id number] Tonette Lederer Diagnosing Phys: Julien Nordmann MD IMPRESSIONS  1. Left ventricular ejection fraction, by estimation, is 60 to 65%. The left ventricle has normal function. The left ventricle has no regional wall motion abnormalities. There is mild left ventricular hypertrophy. Left ventricular diastolic parameters are consistent with Grade I diastolic dysfunction (impaired relaxation).  2. Right ventricular systolic function is normal. The right ventricular size is normal. FINDINGS  Left Ventricle:  Left ventricular ejection fraction, by estimation, is 60 to 65%. The left ventricle has normal function. The left ventricle has no regional wall motion abnormalities. The left ventricular internal cavity size was normal in size. There is  mild left ventricular hypertrophy. Left ventricular diastolic parameters are consistent with Grade I diastolic dysfunction (impaired relaxation). Right Ventricle: The right ventricular size is normal. No increase in right ventricular wall thickness. Right ventricular systolic function is normal. Left Atrium: Left atrial size was normal in size. Right Atrium: Right atrial size was normal in size. Pericardium: There is no evidence of pericardial effusion. Mitral Valve: The mitral valve is normal in structure. Normal mobility of the mitral valve leaflets. No evidence of mitral valve regurgitation. No evidence of mitral valve stenosis. Tricuspid Valve: The tricuspid valve is normal in structure. Tricuspid valve regurgitation is not demonstrated. No evidence of tricuspid stenosis. Aortic Valve: The aortic valve is normal in structure. Aortic valve regurgitation is not visualized. No aortic stenosis is present. Aortic valve mean gradient measures 8.0 mmHg. Aortic valve peak gradient measures 14.4 mmHg. Aortic valve area, by VTI measures 2.40 cm. Pulmonic Valve: The pulmonic valve was normal in structure. Pulmonic valve regurgitation is not visualized. No evidence of pulmonic stenosis. Aorta: The aortic root is normal in size and structure. Venous: The inferior vena cava is normal in size with greater than 50% respiratory variability, suggesting right atrial pressure of 3 mmHg. IAS/Shunts: No atrial level shunt detected by color flow Doppler.  LEFT VENTRICLE PLAX 2D LVIDd:         3.21 cm  Diastology LVIDs:         2.02 cm  LV e' lateral:   8.49 cm/s LV PW:         1.31 cm  LV E/e' lateral: 6.1 LV IVS:        1.40 cm  LV e' medial:    8.16 cm/s LVOT diam:     2.00 cm  LV E/e' medial:   6.3 LV SV:         77 LV SV Index:   34 LVOT Area:     3.14 cm  RIGHT VENTRICLE RV S prime:     23.70 cm/s TAPSE (M-mode): 2.6 cm LEFT ATRIUM             Index       RIGHT ATRIUM           Index LA diam:        2.80 cm 1.25 cm/m  RA Area:     13.70 cm LA Vol (A2C):   34.2 ml 15.26 ml/m RA Volume:   29.60 ml  13.23 ml/m LA Vol (A4C):   53.2 ml 23.78 ml/m LA Biplane Vol: 47.0 ml 21.00 ml/m  AORTIC VALVE                    PULMONIC VALVE AV Area (Vmax):    2.14 cm     PV Vmax:        1.11 m/s AV Area (Vmean):   2.31 cm     PV Peak grad:   4.9 mmHg AV Area (VTI):     2.40 cm     RVOT Peak grad: 7 mmHg AV Vmax:           189.50 cm/s AV Vmean:          130.500 cm/s AV VTI:            0.320 m AV  Peak Grad:      14.4 mmHg AV Mean Grad:      8.0 mmHg LVOT Vmax:         129.00 cm/s LVOT Vmean:        96.100 cm/s LVOT VTI:          0.245 m LVOT/AV VTI ratio: 0.76  AORTA Ao Root diam: 3.10 cm MITRAL VALVE MV Area (PHT): 4.10 cm    SHUNTS MV Decel Time: 185 msec    Systemic VTI:  0.24 m MV E velocity: 51.40 cm/s  Systemic Diam: 2.00 cm MV A velocity: 82.00 cm/s MV E/A ratio:  0.63 Julien Nordmannimothy Gollan MD Electronically signed by Julien Nordmannimothy Gollan MD Signature Date/Time: 06/05/2020/6:38:49 PM    Final         Scheduled Meds:  pantoprazole  40 mg Oral Q0600   Continuous Infusions:  sodium chloride 100 mL/hr at 06/07/20 0442   argatroban 1 mcg/kg/min (06/07/20 0710)     LOS: 2 days        Kathlen ModyVijaya Lannah Koike, MD [facility]   To contact the attending provider between 7A-7P or the covering provider during after hours 7P-7A, please log into the web site [email or web address] and access using universal [place] password for that web site. If you do not have the password, please call the hospital operator.  06/07/2020, 10:43 AM

## 2020-06-07 NOTE — Progress Notes (Addendum)
Subjective  -   No overnight events Passing gas  Physical Exam:  Bilateral lower extremity edema Abdomen is slightly distended, but nontender Respirations nonlabored   Venous duplex:  Bilateral occlusive DVT from the common femoral vein through the visualized calf veins  MRI: The MRI confirms complete bilateral common iliac vein and external iliac vein thrombosis/DVT. There is also at least partial thrombosis of the internal iliac veins as well as the primary collateral venous drainage along the left lumbar drainage pathway, in the setting of chronic infrarenal IVC stenosis/occlusion.  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 As was previously suggested on CT and the MRI preliminary, the leading differential diagnosis of the etiology is retroperitoneal fibrosis, either idiopathic or related to mycobacterial infection. Given the absence of focal soft tissue lesion, atypical carcinoid or other mesenchymal tumor is not favored.  Complete atrophy of the right kidney parenchyma with advanced hydronephrosis/pelvicaliectasis and dilation of the proximal ureter secondary to ureteral occlusion.  Assessment/Plan:    Bilateral lower extremity DVT: I had extensive conversation with the patient and his wife including the use of Spanish interpreter.  The etiology of his DVT is unclear however this appears to extend up into the vena cava.  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  He is having significant lower extremity swelling.  I think he would benefit from bilateral iliofemoral thrombectomy.  I discussed the details of this procedure with them and we will plan on proceeding tomorrow.  He will be n.p.o. after midnight  He is currently on argatroban for decrease in his platelets.  HIT panel is pending  [facility]  Brentney Goldbach 06/07/2020 4:04 PM --  Vitals:   06/07/20 0733 06/07/20 1124  BP: 136/86 128/83  Pulse: (!) 109 (!) 109  Resp: 19 20  Temp: 98.7 F (37.1 C) 98.8 F (37.1 C)  SpO2: 96% 98%    Intake/Output Summary (Last 24 hours) at 06/07/2020 1604 Last data filed at 06/07/2020 1300 Gross per 24 hour  Intake 738.01 ml  Output 3325 ml  Net -2586.99 ml     Laboratory CBC    Component Value Date/Time   WBC 8.3 06/07/2020 0439   HGB 11.5 (L) 06/07/2020 0439   HCT 34.3 (L) 06/07/2020 0439   PLT 85 (L) 06/07/2020 0439    BMET    Component Value Date/Time   NA 134 (L) 06/07/2020 0439   K 4.0 06/07/2020 0439   CL 104 06/07/2020 0439   CO2 25 06/07/2020 0439   GLUCOSE 117 (H) 06/07/2020 0439   BUN 14 06/07/2020 0439   CREATININE 1.11 06/07/2020 0439   CALCIUM 8.0 (L) 06/07/2020 0439   GFRNONAA >60 06/07/2020 0439   GFRAA >60 06/07/2020 0439    COAG Lab Results  Component Value Date   INR 1.2 06/04/2020   No results found for: PTT  Antibiotics Anti-infectives (From admission, onward)   Start     Dose/Rate Route Frequency Ordered Stop   06/05/20 0000  ceFAZolin (ANCEF) IVPB 1 g/50 mL premix       Note to Pharmacy: To be given in specials   1 g 100 mL/hr over 30 Minutes Intravenous  Once 06/04/20 1439 06/04/20 1757   06/04/20 0945  ceFEPIme (MAXIPIME) 2 g in sodium chloride 0.9 % 100 mL IVPB        2 g 200 mL/hr over 30 Minutes  Intravenous  Once 06/04/20 0939 06/04/20 1107   06/04/20 0945  metroNIDAZOLE (FLAGYL) IVPB 500 mg        500 mg 100 mL/hr over 60 Minutes Intravenous  Once 06/04/20 0939 06/04/20 1203   06/04/20 0945  vancomycin (VANCOCIN) IVPB 1000 mg/200 mL premix        1,000 mg 200 mL/hr over 60 Minutes Intravenous  Once 06/04/20 0939 06/04/20 1306       V. Charlena Cross, M.D., [facility] [place] Office: [phone] Pager:  [phone]

## 2020-06-08 ENCOUNTER — Encounter
Admission: EM | Disposition: A | Discharge status: Home / Self Care | Payer: Self-pay | Source: Home / Self Care | Attending: Family Medicine

## 2020-06-08 ENCOUNTER — Encounter: Payer: Self-pay | Admitting: Vascular Surgery

## 2020-06-08 DIAGNOSIS — I82491 Acute embolism and thrombosis of other specified deep vein of right lower extremity: Secondary | ICD-10-CM

## 2020-06-08 DIAGNOSIS — I82492 Acute embolism and thrombosis of other specified deep vein of left lower extremity: Secondary | ICD-10-CM

## 2020-06-08 HISTORY — PX: PERIPHERAL VASCULAR THROMBECTOMY: CATH118306

## 2020-06-08 LAB — BASIC METABOLIC PANEL
Anion gap: 4 — ABNORMAL LOW (ref 5–15)
BUN: 12 mg/dL (ref 6–20)
CO2: 25 mmol/L (ref 22–32)
Calcium: 7.9 mg/dL — ABNORMAL LOW (ref 8.9–10.3)
Chloride: 106 mmol/L (ref 98–111)
Creatinine, Ser: 1.22 mg/dL (ref 0.61–1.24)
GFR calc Af Amer: >60 mL/min (ref >60)
GFR calc non Af Amer: >60 mL/min (ref >60)
Glucose, Bld: 100 mg/dL — ABNORMAL HIGH (ref 70–99)
Potassium: 3.7 mmol/L (ref 3.5–5.1)
Sodium: 135 mmol/L (ref 135–145)

## 2020-06-08 LAB — CBC
HCT: 32.1 % — ABNORMAL LOW (ref 39.0–52.0)
HCT: 33.7 % — ABNORMAL LOW (ref 39.0–52.0)
HCT: 29.7 % — ABNORMAL LOW (ref 39.0–52.0)
Hemoglobin: 10.9 g/dL — ABNORMAL LOW (ref 13.0–17.0)
Hemoglobin: 11.2 g/dL — ABNORMAL LOW (ref 13.0–17.0)
Hemoglobin: 10.1 g/dL — ABNORMAL LOW (ref 13.0–17.0)
MCH: 29.8 pg (ref 26.0–34.0)
MCH: 29.7 pg (ref 26.0–34.0)
MCH: 30.1 pg (ref 26.0–34.0)
MCHC: 34 g/dL (ref 30.0–36.0)
MCHC: 33.2 g/dL (ref 30.0–36.0)
MCHC: 34 g/dL (ref 30.0–36.0)
MCV: 87.7 fL (ref 80.0–100.0)
MCV: 89.4 fL (ref 80.0–100.0)
MCV: 88.4 fL (ref 80.0–100.0)
Platelets: 104 10*3/uL — ABNORMAL LOW (ref 150–400)
Platelets: 97 10*3/uL — ABNORMAL LOW (ref 150–400)
Platelets: 97 10*3/uL — ABNORMAL LOW (ref 150–400)
RBC: 3.66 MIL/uL — ABNORMAL LOW (ref 4.22–5.81)
RBC: 3.77 MIL/uL — ABNORMAL LOW (ref 4.22–5.81)
RBC: 3.36 MIL/uL — ABNORMAL LOW (ref 4.22–5.81)
RDW: 11.9 % (ref 11.5–15.5)
RDW: 11.9 % (ref 11.5–15.5)
RDW: 11.9 % (ref 11.5–15.5)
WBC: 7.7 10*3/uL (ref 4.0–10.5)
WBC: 12.7 10*3/uL — ABNORMAL HIGH (ref 4.0–10.5)
WBC: 10.3 10*3/uL (ref 4.0–10.5)
nRBC: 0 % (ref 0.0–0.2)
nRBC: 0 % (ref 0.0–0.2)
nRBC: 0.2 % (ref 0.0–0.2)

## 2020-06-08 LAB — FIBRINOGEN
Fibrinogen: 118 mg/dL — ABNORMAL LOW (ref 210–475)
Fibrinogen: 64 mg/dL — CL (ref 210–475)

## 2020-06-08 LAB — PATHOLOGIST SMEAR REVIEW

## 2020-06-08 LAB — GLUCOSE, CAPILLARY
Glucose-Capillary: 100 mg/dL — ABNORMAL HIGH (ref 70–99)
Glucose-Capillary: 96 mg/dL (ref 70–99)
Glucose-Capillary: 118 mg/dL — ABNORMAL HIGH (ref 70–99)
Glucose-Capillary: 116 mg/dL — ABNORMAL HIGH (ref 70–99)

## 2020-06-08 LAB — PROTEIN ELECTRO, RANDOM URINE
Albumin ELP, Urine: 0 %
Alpha-1-Globulin, U: 0 %
Alpha-2-Globulin, U: 0 %
Beta Globulin, U: 0 %
Gamma Globulin, U: 0 %
Total Protein, Urine: <4 mg/dL

## 2020-06-08 LAB — MRSA PCR SCREENING: MRSA by PCR: NEGATIVE

## 2020-06-08 LAB — LACTATE DEHYDROGENASE: LDH: 206 U/L — ABNORMAL HIGH (ref 98–192)

## 2020-06-08 LAB — APTT
aPTT: 92 seconds — ABNORMAL HIGH (ref 24–36)
aPTT: 81 seconds — ABNORMAL HIGH (ref 24–36)

## 2020-06-08 LAB — HEPARIN LEVEL (UNFRACTIONATED)
Heparin Unfractionated: <0.1 IU/mL — ABNORMAL LOW (ref 0.30–0.70)
Heparin Unfractionated: <0.1 IU/mL — ABNORMAL LOW (ref 0.30–0.70)

## 2020-06-08 LAB — PROTIME-INR
INR: 2.2 — ABNORMAL HIGH (ref 0.8–1.2)
Prothrombin Time: 23.3 seconds — ABNORMAL HIGH (ref 11.4–15.2)

## 2020-06-08 SURGERY — PERIPHERAL VASCULAR THROMBECTOMY
Anesthesia: Moderate Sedation | Laterality: Bilateral

## 2020-06-08 MED ORDER — MORPHINE SULFATE (PF) 2 MG/ML IV SOLN
INTRAVENOUS | Status: AC
  Administered 2020-06-08: 2 mg via INTRAVENOUS
  Filled 2020-06-08: qty 1

## 2020-06-08 MED ORDER — FENTANYL CITRATE (PF) 100 MCG/2ML IJ SOLN
INTRAMUSCULAR | Status: DC | PRN
  Administered 2020-06-08 (×3): 50 ug via INTRAVENOUS

## 2020-06-08 MED ORDER — SODIUM CHLORIDE 0.9 % IV SOLN: INTRAVENOUS | Status: DC

## 2020-06-08 MED ORDER — CEFAZOLIN SODIUM-DEXTROSE 2-4 GM/100ML-% IV SOLN
2.0000 g | INTRAVENOUS | Status: AC
  Filled 2020-06-08: qty 100

## 2020-06-08 MED ORDER — CHLORHEXIDINE GLUCONATE CLOTH 2 % EX PADS
6.0000 | MEDICATED_PAD | Freq: Every day | CUTANEOUS | Status: DC
  Administered 2020-06-08 – 2020-06-14 (×7): 6 via TOPICAL

## 2020-06-08 MED ORDER — ARGATROBAN 50 MG/50ML IV SOLN
0.8000 ug/kg/min | INTRAVENOUS | Status: DC
  Administered 2020-06-08: 0.8 ug/kg/min via INTRAVENOUS
  Filled 2020-06-08: qty 50

## 2020-06-08 MED ORDER — SODIUM CHLORIDE 0.9% FLUSH
3.0000 mL | Freq: Two times a day (BID) | INTRAVENOUS | Status: DC
  Administered 2020-06-08 – 2020-06-09 (×3): 3 mL via INTRAVENOUS

## 2020-06-08 MED ORDER — SODIUM CHLORIDE 0.9% FLUSH
3.0000 mL | INTRAVENOUS | Status: DC | PRN
  Administered 2020-06-08 – 2020-06-09 (×2): 3 mL via INTRAVENOUS

## 2020-06-08 MED ORDER — BISACODYL 5 MG PO TBEC
5.0000 mg | DELAYED_RELEASE_TABLET | Freq: Once | ORAL | Status: AC
  Administered 2020-06-08: 5 mg via ORAL
  Filled 2020-06-08: qty 1

## 2020-06-08 MED ORDER — ALTEPLASE 2 MG IJ SOLR
INTRAMUSCULAR | Status: AC
  Filled 2020-06-08: qty 12

## 2020-06-08 MED ORDER — FENTANYL CITRATE (PF) 100 MCG/2ML IJ SOLN
INTRAMUSCULAR | Status: AC
  Filled 2020-06-08: qty 2

## 2020-06-08 MED ORDER — ALTEPLASE 50 MG IV SOLR
1.0000 mg/h | Freq: Once | INTRAVENOUS | Status: DC
Start: 2020-06-08 — End: 2020-06-08

## 2020-06-08 MED ORDER — SODIUM CHLORIDE 0.9 % IV SOLN
1.0000 mg/h | INTRAVENOUS | Status: AC
  Administered 2020-06-08: 1 mg/h
  Filled 2020-06-08: qty 10

## 2020-06-08 MED ORDER — MIDAZOLAM HCL 2 MG/2ML IJ SOLN
INTRAMUSCULAR | Status: DC | PRN
  Administered 2020-06-08 (×3): 2 mg via INTRAVENOUS

## 2020-06-08 MED ORDER — MIDAZOLAM HCL 2 MG/2ML IJ SOLN
INTRAMUSCULAR | Status: AC
  Filled 2020-06-08: qty 2

## 2020-06-08 MED ORDER — ALTEPLASE 2 MG IJ SOLR
INTRAMUSCULAR | Status: DC | PRN
  Administered 2020-06-08: 6 mg

## 2020-06-08 MED ORDER — SODIUM CHLORIDE 0.9 % IV SOLN
0.2500 mg/h | INTRAVENOUS | Status: DC
  Filled 2020-06-08 (×3): qty 10

## 2020-06-08 MED ORDER — IODIXANOL 320 MG/ML IV SOLN
INTRAVENOUS | Status: DC | PRN
  Administered 2020-06-08: 70 mL via INTRAVENOUS

## 2020-06-08 MED ORDER — SODIUM CHLORIDE 0.9 % IV SOLN
1.0000 mg/h | INTRAVENOUS | Status: DC
  Filled 2020-06-08 (×2): qty 10

## 2020-06-08 MED ORDER — CEFAZOLIN SODIUM-DEXTROSE 2-4 GM/100ML-% IV SOLN
INTRAVENOUS | Status: AC
  Filled 2020-06-08: qty 100

## 2020-06-08 MED ORDER — MIDAZOLAM HCL 5 MG/5ML IJ SOLN
INTRAMUSCULAR | Status: AC
  Filled 2020-06-08: qty 5

## 2020-06-08 MED ORDER — SODIUM CHLORIDE 0.9 % IV SOLN: 250.0000 mL | INTRAVENOUS | Status: DC | PRN

## 2020-06-08 MED ORDER — DOCUSATE SODIUM 100 MG PO CAPS
100.0000 mg | ORAL_CAPSULE | Freq: Two times a day (BID) | ORAL | Status: DC
  Administered 2020-06-08 – 2020-06-16 (×12): 100 mg via ORAL
  Filled 2020-06-08 (×12): qty 1

## 2020-06-08 MED ORDER — HEPARIN SODIUM (PORCINE) 1000 UNIT/ML IJ SOLN
INTRAMUSCULAR | Status: AC
  Filled 2020-06-08: qty 1

## 2020-06-08 SURGICAL SUPPLY — 20 items
BALLN ATG 14X6X80 (BALLOONS) ×3
BALLN DORADO 10X80X80 (BALLOONS) ×3
BALLOON ATG 14X6X80 (BALLOONS) ×1 IMPLANT
BALLOON DORADO 10X80X80 (BALLOONS) ×1 IMPLANT
CANISTER PENUMBRA ENGINE (MISCELLANEOUS) ×3 IMPLANT
CANNULA 5F STIFF (CANNULA) ×3 IMPLANT
CATH BEACON 5 .035 65 KMP TIP (CATHETERS) ×3 IMPLANT
CATH BEACON 5 .038 100 VERT TP (CATHETERS) ×3 IMPLANT
CATH INDIGO 12XTORQ 100 (CATHETERS) ×3 IMPLANT
CATH INFUS 90CMX20CM (CATHETERS) ×3 IMPLANT
CATH INFUS 90CMX50CM (CATHETERS) ×2
CATH INFUS UNIFUSE 90X50 5FR (CATHETERS) ×1 IMPLANT
DEVICE PRESTO INFLATION (MISCELLANEOUS) ×6 IMPLANT
GLIDEWIRE ADV .035X260CM (WIRE) ×3 IMPLANT
PACK ANGIOGRAPHY (CUSTOM PROCEDURE TRAY) ×3 IMPLANT
SHEATH BRITE TIP 6FRX11 (SHEATH) ×3 IMPLANT
SHEATH PINNACLE 11FRX10 (SHEATH) ×6 IMPLANT
SUT PROLENE 0 CT 1 30 (SUTURE) ×6 IMPLANT
WIRE MAGIC TOR.035 180C (WIRE) ×3 IMPLANT
WIRE MAGIC TORQUE 260C (WIRE) ×3 IMPLANT

## 2020-06-08 NOTE — Progress Notes (Signed)
Patient to be transferred to stepdown from specials s/p thrombectomy.  Report called and given to [facility] in ICU. Belongings sent over to ICU by NT.

## 2020-06-08 NOTE — Progress Notes (Signed)
Came up to speak with patient and wife.  She has left but will return later. He is awake and alert Tolerating thrombolytic therapy Will continue that tonight.  Ok to eat tonight and tomorrow Have ordered stents to treat the IVC occlusion but won't be available until at least Wednesday.

## 2020-06-08 NOTE — Progress Notes (Signed)
ANTICOAGULATION CONSULT NOTE  Pharmacy Consult for argatroban Indication: DVT  No Known Allergies  Patient Measurements: Height: 5\' 11"  (180.3 cm) Weight: 115.9 kg (255 lb 9.6 oz) IBW/kg (Calculated) : 75.3 Heparin Dosing Weight: 97.2 kg  Vital Signs: Temp: 98.2 F (36.8 C) (07/19 1123) Temp Source: Oral (07/19 1123) BP: 123/83 (07/19 1123) Pulse Rate: 118 (07/19 1123)  Labs: Recent Labs    06/06/20 0351 06/06/20 0351 06/06/20 1311 06/06/20 2000 06/07/20 0439 06/08/20 0317 06/08/20 1016  HGB 12.8*   < > 12.4*  --  11.5* 10.9*  --   HCT 36.4*   < > 35.2*  --  34.3* 32.1*  --   PLT 84*   < > 84*  --  85* 104*  --   APTT  --   --   --    < > 87* 92* 81*  LABPROT  --   --   --   --   --  23.3*  --   INR  --   --   --   --   --  2.2*  --   CREATININE 1.45*  --   --   --  1.11 1.22  --   CKTOTAL 801*  --   --   --   --   --   --    < > = values in this interval not displayed.    Estimated Creatinine Clearance: 107.3 mL/min (by C-G formula based on SCr of 1.22 mg/dL).   Assessment: Patient is a 39yo male admitted with limb ischemia. Pharmacy consulted for Heparin dosing. No prior anticoagulants noted. Patient with previous angiogram with bleeding to femoral site 7/15. Heparin stopped at that time. Hg/plt have trended down.   Patient with confirmed bilateral occlusive DVTs. Plan for patient to have thrombectomy on Monday. Concern for HIT. Pharmacy consulted for argatroban.  7/19 0317  aPTT 92 - decreased to 0.8 mcg/kg/min 7/19 1016 aPTT 81   Goal of Therapy:  APTT 50-90 sec Monitor platelets by anticoagulation protocol: Yes   Plan:  APTT is therapeutic. Will continue current rate once argatroban is restarted. On Hold for thrombectomy. Plan to order aPTT 2 hours after infusion is restarted. Hgb is trending down - continue to monitor.    8/19, PharmD, BCPS Clinical Pharmacist 06/08/2020 11:34 AM

## 2020-06-08 NOTE — Op Note (Signed)
[facility]   OPERATIVE NOTE   PRE-OPERATIVE DIAGNOSIS: extensive BLE and IVC DVT  POST-OPERATIVE DIAGNOSIS: same with IVC occlusion  PROCEDURE: 1.   US guidance for vascular access to bilateral popliteal vein 2.   Catheter placement into bilateral common iliac veins and IVC from bilateral popliteal approach 3.   IVC gram and bilateral lower extremity venogram 4.   Catheter directed thrombolysis with 6 mg of TPA to the common iliac veins, external iliac veins, common femoral veins, and superficial femoral veins bilaterally 5.   Mechanical thrombectomy to the IVC, bilateral common iliac veins, bilateral external iliac veins, bilateral common femoral veins, and bilateral superficial femoral veins with the penumbra CAT 12 device 6.   PTA of left common and external iliac veins with 10 mm balloon 7.   PTA of the right common femoral vein, external and common iliac veins with 10 mm balloons 8.   PTA of the IVC with 10 and 14 mm balloons 9.   Placement of catheters for continuous thrombolytic therapy with a 90 cm total length 20 cm working length catheter in the left iliac veins and a 90 cm total length 50 cm working length catheter in the IVC, left common and external iliac veins, and left common and superficial femoral veins    SURGEON: Leotis Pain, MD  ASSISTANT(S): none  ANESTHESIA: local with moderate conscious sedation for 60 minutes using 6 mg of Versed and 150 mcg of Fentanyl  ESTIMATED BLOOD LOSS: 400 cc  FINDING(S): 1.  Severe thrombosis throughout both lower extremities and iliac veins all the way up to the IVC which was thrombosed and occluded in the distal segment.  The occlusion was very tight and not just thrombus there was clearly a chronic fibrotic component as well.  SPECIMEN(S):  none  INDICATIONS:    Patient is a 39 y.o. male who presents with massive leg swelling and near phlegmasia and was found to have extensive bilateral lower extremity DVTs  all the way up to the IVC.  Patient has marked leg swelling and pain.  Venous intervention is performed to reduce the symtpoms and avoid long term postphlebitic symptoms.    DESCRIPTION: After obtaining full informed written consent, the patient was brought back to the vascular suite and placed supine upon the table. Moderate conscious sedation was administered during a face to face encounter with the patient throughout the procedure with my supervision of the RN administering medicines and monitoring the patient's vital signs, pulse oximetry, telemetry and mental status throughout from the start of the procedure until the patient was taken to the recovery room.  After obtaining adequate anesthesia, the patient was prepped and draped in the standard fashion.    The patient was then placed into the prone position.  The left popliteal vein was then accessed under direct ultrasound guidance without difficulty with a micropuncture needle and a permanent image was recorded.  I then upsized to an 11Fr sheath over a J wire.  Similarly, the right popliteal vein was accessed under direct ultrasound guidance with a micropuncture needle and a permanent images recorded.  Then upsized to an 41 French sheath over a J-wire.  Imaging showed extensive DVT with minimal flow.  A Kumpe catheter and Magic tourque wire were then advanced into the left CFV and images were performed.  The iliacs were completely thrombosed and no flow was seen in the IVC.  I was able to cross the thrombus and stenosis and advance into the  proximal common iliac artery which was all thrombosed and the IVC was found to be occluded only about 1 to 2 cm beyond the confluence.  I then used the Kumpe catheter and instilled 6 mg of tpa throughout the left iliac and common femoral and superficial femoral veins.  While this was dwelling, I similarly advanced the Kumpe catheter and a Magic torque wire up the right side.  Both in the common femoral vein and common  iliac vein there remained occlusive thrombus and again the IVC was seen to be occluded.  An additional 6 mg of TPA was then given throughout the right venous system starting in the iliac veins backing down into the common femoral and superficial femoral veins.  After this dwelled for 15 minutes on the left side, I used the Penumbra Cat 12 catheter and evacuated about 100 to 150 cc of effluent with mechanical thrombectomy throughout the iliac veins, CFV, SFV.  This had mild improvement in the iliac veins although the thrombus burden in the superficial femoral veins and the common femoral veins were markedly improved.  I then treated left common and external iliac veins with 2 inflations with a 10 mm diameter by 8 cm length angioplasty balloon inflated to 10 atm for 1 minute.  Minimal flow still seen due to the IVC occlusion.  I then came up from the right side, and cross the IVC occlusion with minimal difficulty with an advantage wire and a Kumpe catheter confirming flow in the IVC with patency above the occlusion.  This was up near the level of the renal veins.  The penumbra CAT 12 catheter was then brought out from the right side treating superficial femoral vein, common femoral vein, external and common iliac veins, and IVC.  Several passes were made with another 150 to 200 cc of effluent returned in large amounts of thrombus.  Again, there was marked improvement in the superficial femoral vein but the common femoral vein, iliac vein, and IVC remained thrombosed and occluded.  I did 3 inflations with a 10 mm diameter by 8 cm length angioplasty balloon from the common femoral vein up through the external and common iliac veins.  An additional inflation up in the IVC was performed with a 10 mm diameter by 8 cm length angioplasty balloon.  Each inflation was about 8 to 12 atm for 1 minute.  The cava remained occluded and there remained marked thrombus in the iliac veins and common femoral vein on the right.  I  upsized to a 14 mm diameter by 6 cm length angioplasty balloon to treat the distal IVC and inflated this up to 6 atm for 1 minute.  A tight waist was seen which resolved with inflation but on completion imaging his head immediately recoiled and remained occluded.  With the extensive thrombus burden and the large amount of blood already evacuated, I felt her best option would be to place infusion catheters for continuous thrombolytic therapy.  On the left side, a 90 cm total length 20 cm working length catheter was concentrated in the iliac veins to treat the thrombus there.  On the right, we advanced this catheter up into the mid IVC across the iliac veins and down into the common femoral vein and superficial femoral vein.  These were secured with Prolene sutures and continuous thrombolytic therapy will be planned.  I then elected to terminate the procedure.  At this point, he was taken to the recovery room in stable condition having tolerated the  procedure well.    COMPLICATIONS: None  CONDITION: Stable  Leotis Pain 06/08/2020 1:09 PM

## 2020-06-08 NOTE — Progress Notes (Signed)
Paged Dr. Gilda Crease to alert of fibrinogen level of 64. Per Dr. Gilda Crease new order to change alteplase infusion to 0.25mg /hr.

## 2020-06-08 NOTE — H&P (Signed)
[facility] History & Physical Update  The patient was interviewed and re-examined.  The patient's previous History and Physical has been reviewed and is unchanged.  There is no change in the plan of care. We plan to proceed with the scheduled procedure.  Festus Barren, MD  06/08/2020, 11:21 AM

## 2020-06-08 NOTE — Progress Notes (Signed)
ANTICOAGULATION CONSULT NOTE  Pharmacy Consult for argatroban Indication: suspected DVT  No Known Allergies  Patient Measurements: Height: 5\' 11"  (180.3 cm) Weight: 115.9 kg (255 lb 9.6 oz) IBW/kg (Calculated) : 75.3 Heparin Dosing Weight: 97.2 kg  Vital Signs: Temp: 98.9 F (37.2 C) (07/19 0324) Temp Source: Oral (07/19 0324) BP: 131/89 (07/19 0324) Pulse Rate: 93 (07/19 0324)  Labs: Recent Labs    06/06/20 0351 06/06/20 0351 06/06/20 1311 06/06/20 1311 06/06/20 2000 06/06/20 2246 06/07/20 0439 06/08/20 0317  HGB 12.8*   < > 12.4*   < >  --   --  11.5* 10.9*  HCT 36.4*   < > 35.2*  --   --   --  34.3* 32.1*  PLT 84*   < > 84*  --   --   --  85* 104*  APTT  --   --   --   --    < > 79* 87* 92*  LABPROT  --   --   --   --   --   --   --  23.3*  INR  --   --   --   --   --   --   --  2.2*  CREATININE 1.45*  --   --   --   --   --  1.11 1.22  CKTOTAL 801*  --   --   --   --   --   --   --    < > = values in this interval not displayed.    Estimated Creatinine Clearance: 107.3 mL/min (by C-G formula based on SCr of 1.22 mg/dL).   Assessment: Patient is a 39yo male admitted with limb ischemia. Pharmacy consulted for Heparin dosing. No prior anticoagulants noted. Patient with previous angiogram with bleeding to femoral site 7/15. Heparin stopped at that time. Hg/plt have trended down. Pharmacy re-consulted for heparin dosing for suspected DVT.  Update 1500: Patient with confirmed bilateral occlusive DVTs. Plan for patient to have thrombectomy on Monday. Concern for HIT. Pharmacy consulted for argatroban.  Goal of Therapy:  APTT 50-90 sec Monitor platelets by anticoagulation protocol: Yes   Plan:  07/19 @ 0300 aPTT 92 seconds slightly supratherapeutic. Will decrease rate to 0.8 mcg/kg/min and will recheck aPTT at 0900, CBC continue to trend down slightly, will continue to monitor.  8/19, PharmD, BCPS Clinical Pharmacist 06/08/2020 7:34 AM

## 2020-06-08 NOTE — Progress Notes (Signed)
PROGRESS NOTE    Austin Glover  YKZ:[id number] DOB: 01-26-1981 DOA: 06/04/2020 PCP: Patient, No Pcp Per    Chief Complaint  Patient presents with   Back Pain    Brief Narrative:   39 year old gentleman with no significant past medical history presented to [facility] ED for bilateral leg pain started on Monday.  He was initially admitted by vascular surgery on 06/04/2020 for evaluation of critical limb ischemia.  He underwent aortogram and bilateral lower extremity angiogram without any significant limb ischemia.  Patient was transferred to hospitalist service for evaluation of CHF and diabetes. Patient seen and examined this morning reports his bilateral lower extremity pain has improved but not completely resolved.  He denies any chest pain, shortness of breath, nausea, vomiting or abdominal pain.  He has not ambulated since 2 days.  He denies any headache dizziness, blurry vision. Upon further evaluation his CT angiogram of the abdominal aorta, and bilateral lower extremity with iliofemoral runoff shows There is massive right hydronephrosis and proximal ureterectasis with advanced atrophy of the renal parenchyma, only a thin rim of tissue evident. The right ureterectasis terminates at the level of a right retroperitoneal soft tissue mass containing coarse calcifications process abutting the right common iliac vein and distal IVC. Distal right ureter decompressed. Urinary bladder nondistended, mildly thick-walled. Thick-walled urachal remnant withsoft tissue nodularity containing 2 foci of calcification.  CT angiogram of the chest was done and negative for PE.   Assessment & Plan:   Active Problems:   Ischemia of extremity   Leg pain   Bilateral leg pain Probably secondary to bilateral occlusive DVTs.  Venous duplex of the lower extremities showed Bilateral occlusive deep vein thrombus extending from the common femoral veins through the visualized calf  veins. Vascular surgery ruled out critical limb ischemia with negative angiogram of the lower extremities.      Bilateral occlusive DVT'S involving the IVC : Plan for bilateral iliofemoral  thrombectomy today by vascular surgery. On Argatroban gtt.   AKI: Differential include dehydration, pre renal azotemia vs obstructive causes leading to right hydronephrosis .  On admission patient's creatinine was around 1.8 improved to 1.2 today Urine output adequate.  CONTINUE to monitor the creatinine as he had contrast for the angiogram.    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 HIV screen is negative.  MRI of the abdomen ordered for further evaluation. 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  Quantiferon Gold ordered and pending.     Right sided hydronephrosis with right ureterectasis: Urology consulted, suggested, This is a chronic process as there is no remaining renal parenchyma, so no role for ureteral stent or nephrostomy tube Recommended outpatient follow up with him in the clinic after discharge.  His renal parameters have improved with IV fluids.    Mild leukocytosis:  Suspect reactive. Normalized today.    Thrombocytopenia:  Suspect from the heparin used on the day of admission vs ?  HIT ordered and pending.  Argatroban gtt ordered for DVT.    Mild anemia of blood loss from the procedure:  RN Reported that patient had blood loss after the aortogram from the site.  Continue to monitor.  Hemoglobin from 15 on admission to 12.8 to 12.4 to 11.5 to 10.9.  No signs of obvious bleeding.    Hypomagnesemia:  Replaced.    Tachycardia:  EKG shows sinus tachycardia.  Unclear etiology. ?  Leg pain vs dehydration vs PE.  EKG shows sinus tachycardia and t wave inversion in lead II, III AVF and lateral leads.  Pt denies chest pain or sob, palpitations.  He was  empirically started on aspirin and prn metoprolol.  He remains asymptomatic.   CT angiogram of the chest ruled out PE.   Echocardiogram shows Left ventricular ejection fraction, by estimation, is 60 to 65%. The  left ventricle has normal function. The left ventricle has no regional  wall motion abnormalities. There is mild left ventricular hypertrophy.  Left ventricular diastolic parameters  are consistent with Grade I diastolic dysfunction (impaired relaxation). No valve abnormalities.     DVT prophylaxis: Argatroban.  Code Status: full code.  Family Communication: DISCUSSED with wife at bedside.  Disposition:   Status is: Inpatient  Remains inpatient appropriate because:Hemodynamically unstable and Ongoing diagnostic testing needed not appropriate for outpatient work up   Dispo: The patient is from: Home              Anticipated d/c is to: pending.               Anticipated d/c date is: 2 days              Patient currently is not medically stable to d/c.       Consultants:   Urology  Vascular surgery     Procedures:  .           Ultrasound guidance for vascular access left femoral artery  Catheter placement into right SFA from left femoral approach  Aortogram and selective bilateral lower extremity angiogram  StarClose closure device left femoral artery   Antimicrobials: none.    Subjective: Improving leg pain, reports did not have any known contact with TB, unaware of any family members having tuberculosis.   Objective: Vitals:   06/08/20 1306 06/08/20 1332 06/08/20 1345 06/08/20 1400  BP: (!) 134/100  (!) 122/98 (!) 154/89  Pulse:  (!) 117 (!) 132 (!) 122  Resp: 15 (!) 25 18 19   Temp:      TempSrc:      SpO2: 96% 96% 98% 98%  Weight:      Height:        Intake/Output Summary (Last 24 hours) at 06/08/2020 1411 Last data filed at 06/08/2020 0715 Gross per 24 hour  Intake 240 ml  Output 2275 ml  Net -2035 ml     Filed Weights   06/06/20 0342 06/07/20 0354 06/08/20 0324  Weight: 115 kg 116.1 kg 115.9 kg    Examination:  General exam: Well-developed gentleman not in any kind of distress. Respiratory system: Clear to auscultation bilaterally, no wheezing or rhonchi Cardiovascular system: S1-S2 heard, tachycardic, no JVD, bilateral leg edema Gastrointestinal system: Abdomen is soft, nontender, nondistended, bowel sounds normal Central nervous system: Alert and oriented, grossly nonfocal Extremities: Bilateral leg edema and tenderness in the thighs present Skin: No rashes seen Psychiatry: Mood is appropriate    Data Reviewed: I have personally reviewed following labs and imaging studies  CBC: Recent Labs  Lab 06/04/20 0756 06/04/20 0756 06/05/20 0521 06/06/20 0351 06/06/20 1311 06/07/20 0439 06/08/20 0317  WBC 18.9*   < > 11.5* 9.4 9.3 8.3 7.7  NEUTROABS 14.0*  --   --   --  6.6  --   --   HGB 16.0   < > 15.0 12.8* 12.4* 11.5* 10.9*  HCT 47.4   < > 42.5 36.4* 35.2* 34.3* 32.1*  MCV 88.3   < >  86.4 85.6 85.6 89.3 87.7  PLT 178   < > 124* 84* 84* 85* 104*   < > = values in this interval not displayed.    Basic Metabolic Panel: Recent Labs  Lab 06/05/20 0521 06/05/20 2339 06/06/20 0351 06/07/20 0439 06/08/20 0317  NA 137 131* 132* 134* 135  K 4.7 4.2 4.0 4.0 3.7  CL 109 102 104 104 106  CO2 19* 23 22 25 25   GLUCOSE 131* 118* 120* 117* 100*  BUN 21* 19 17 14 12   CREATININE 1.57* 1.62* 1.45* 1.11 1.22  CALCIUM 8.4* 8.5* 8.5* 8.0* 7.9*  MG 1.6*  --  2.1  --   --     GFR: Estimated Creatinine Clearance: 107.3 mL/min (by C-G formula based on SCr of 1.22 mg/dL).  Liver Function Tests: Recent Labs  Lab 06/04/20 0756 06/06/20 0351  AST 32 32  ALT 33 24  ALKPHOS 90 61  BILITOT 1.2 1.1  PROT 8.2* 6.3*  ALBUMIN 4.5 3.4*    CBG: Recent Labs  Lab 06/07/20 0731 06/07/20 1122 06/07/20 1558 06/08/20 0718 06/08/20 1108  GLUCAP 103* 123* 111* 100* 96     Recent  Results (from the past 240 hour(s))  Blood culture (routine x 2)     Status: None (Preliminary result)   Collection Time: 06/04/20  9:50 AM   Specimen: BLOOD  Result Value Ref Range Status   Specimen Description BLOOD LEFT AC  Final   Special Requests   Final    BOTTLES DRAWN AEROBIC AND ANAEROBIC Blood Culture adequate volume   Culture   Final    NO GROWTH 4 DAYS Performed at [facility], [address] [place], [address] [place]    Report Status PENDING  Incomplete  Blood culture (routine x 2)     Status: None (Preliminary result)   Collection Time: 06/04/20  9:50 AM   Specimen: BLOOD  Result Value Ref Range Status   Specimen Description BLOOD RIGHT HAND  Final   Special Requests   Final    BOTTLES DRAWN AEROBIC AND ANAEROBIC Blood Culture adequate volume   Culture   Final    NO GROWTH 4 DAYS Performed at [facility], [address], [place], [address] [place]    Report Status PENDING  Incomplete  SARS Coronavirus 2 by RT PCR (hospital order, performed in [facility] hospital lab) Nasopharyngeal Nasopharyngeal Swab     Status: None   Collection Time: 06/04/20 10:18 AM   Specimen: Nasopharyngeal Swab  Result Value Ref Range Status   SARS Coronavirus 2 NEGATIVE NEGATIVE Final    Comment: (NOTE) SARS-CoV-2 target nucleic acids are NOT DETECTED.  The SARS-CoV-2 RNA is generally detectable in upper and lower respiratory specimens during the acute phase of infection. The lowest concentration of SARS-CoV-2 viral copies this assay can detect is 250 copies / mL. A negative result does not preclude SARS-CoV-2 infection and should not be used as the sole basis for treatment or other patient management decisions.  A negative result may occur with improper specimen collection / handling, submission of specimen other than nasopharyngeal swab, presence of viral mutation(s) within the areas targeted by this assay, and inadequate number of viral copies (<250 copies /  mL). A negative result must be combined with clinical observations, patient history, and epidemiological information.  Fact Sheet for Patients:   [facility]  Fact Sheet for Healthcare Providers: 06/06/20  This test is not yet approved or  cleared by the [employer] FDA  and has been authorized for detection and/or diagnosis of SARS-CoV-2 by FDA under an Emergency Use Authorization (EUA).  This EUA will remain in effect (meaning this test can be used) for the duration of the COVID-19 declaration under Section 564(b)(1) of the Act, 21 U.S.C. section 360bbb-3(b)(1), unless the authorization is terminated or revoked sooner.  Performed at [facility], [address] [place], [place] [address]          Radiology Studies: CT ANGIO CHEST PE W OR WO CONTRAST  Result Date: 06/07/2020 CLINICAL DATA:  Known bilateral DVT's.  Question pulmonary embolus. EXAM: CT ANGIOGRAPHY CHEST WITH CONTRAST TECHNIQUE: Multidetector CT imaging of the chest was performed using the standard protocol during bolus administration of intravenous contrast. Multiplanar CT image reconstructions and MIPs were obtained to evaluate the vascular anatomy. CONTRAST:  75 mL OMNIPAQUE IOHEXOL 350 MG/ML SOLN COMPARISON:  None. FINDINGS: Cardiovascular: Satisfactory opacification of the pulmonary arteries to the segmental level. No evidence of pulmonary embolism. Normal heart size. No pericardial effusion. Mediastinum/Nodes: No enlarged mediastinal, hilar, or axillary lymph nodes. Thyroid gland, trachea, and esophagus demonstrate no significant findings. Lungs/Pleura: No pleural effusion.  Mild dependent atelectasis. Upper Abdomen: Negative. Musculoskeletal: Negative. Review of the MIP images confirms the above findings. IMPRESSION: Negative for pulmonary embolus.  Negative chest CT. Electronically Signed   By: Drusilla Kannerhomas  Dalessio M.D.   On: 06/07/2020 14:51         Scheduled Meds:  fentaNYL       fentaNYL       midazolam       midazolam       [MAR Hold] pantoprazole  40 mg Oral Q0600   sodium chloride flush  3 mL Intravenous Q12H   Continuous Infusions:  sodium chloride 100 mL/hr at 06/08/20 0846   sodium chloride     sodium chloride     [START ON 06/09/2020] sodium chloride     alteplase (LIMB ISCHEMIA) 10 mg in normal saline (0.02 mg/mL) infusion     alteplase (LIMB ISCHEMIA) 10 mg in normal saline (0.02 mg/mL) infusion     alteplase (LIMB ISCHEMIA) 10 mg in normal saline (0.02 mg/mL) infusion     alteplase (LIMB ISCHEMIA) 10 mg in normal saline (0.02 mg/mL) infusion       LOS: 3 days        Kathlen ModyVijaya Bryndan Bilyk, MD [facility]   To contact the attending provider between 7A-7P or the covering provider during after hours 7P-7A, please log into the web site [email or web address] and access using universal [place] password for that web site. If you do not have the password, please call the hospital operator.  06/08/2020, 2:11 PM

## 2020-06-09 ENCOUNTER — Encounter: Payer: Self-pay | Admitting: Vascular Surgery

## 2020-06-09 DIAGNOSIS — R52 Pain, unspecified: Secondary | ICD-10-CM

## 2020-06-09 DIAGNOSIS — R918 Other nonspecific abnormal finding of lung field: Secondary | ICD-10-CM

## 2020-06-09 DIAGNOSIS — I82403 Acute embolism and thrombosis of unspecified deep veins of lower extremity, bilateral: Secondary | ICD-10-CM | POA: Diagnosis present

## 2020-06-09 DIAGNOSIS — N135 Crossing vessel and stricture of ureter without hydronephrosis: Secondary | ICD-10-CM | POA: Diagnosis present

## 2020-06-09 DIAGNOSIS — N179 Acute kidney failure, unspecified: Secondary | ICD-10-CM

## 2020-06-09 DIAGNOSIS — I82423 Acute embolism and thrombosis of iliac vein, bilateral: Secondary | ICD-10-CM

## 2020-06-09 DIAGNOSIS — N1330 Unspecified hydronephrosis: Secondary | ICD-10-CM | POA: Diagnosis present

## 2020-06-09 LAB — CBC
HCT: 29 % — ABNORMAL LOW (ref 39.0–52.0)
HCT: 27.8 % — ABNORMAL LOW (ref 39.0–52.0)
Hemoglobin: 10.2 g/dL — ABNORMAL LOW (ref 13.0–17.0)
Hemoglobin: 9.6 g/dL — ABNORMAL LOW (ref 13.0–17.0)
MCH: 30.6 pg (ref 26.0–34.0)
MCH: 30.5 pg (ref 26.0–34.0)
MCHC: 35.2 g/dL (ref 30.0–36.0)
MCHC: 34.5 g/dL (ref 30.0–36.0)
MCV: 87.1 fL (ref 80.0–100.0)
MCV: 88.3 fL (ref 80.0–100.0)
Platelets: 94 10*3/uL — ABNORMAL LOW (ref 150–400)
Platelets: 91 10*3/uL — ABNORMAL LOW (ref 150–400)
RBC: 3.33 MIL/uL — ABNORMAL LOW (ref 4.22–5.81)
RBC: 3.15 MIL/uL — ABNORMAL LOW (ref 4.22–5.81)
RDW: 11.9 % (ref 11.5–15.5)
RDW: 12.1 % (ref 11.5–15.5)
WBC: 7.6 10*3/uL (ref 4.0–10.5)
WBC: 7.1 10*3/uL (ref 4.0–10.5)
nRBC: 0 % (ref 0.0–0.2)
nRBC: 0 % (ref 0.0–0.2)

## 2020-06-09 LAB — SEDIMENTATION RATE: Sed Rate: 19 mm/hr — ABNORMAL HIGH (ref 0–15)

## 2020-06-09 LAB — CULTURE, BLOOD (ROUTINE X 2)
Culture: NO GROWTH
Culture: NO GROWTH
Special Requests: ADEQUATE
Special Requests: ADEQUATE

## 2020-06-09 LAB — BETA-2-GLYCOPROTEIN I ABS, IGG/M/A
Beta-2 Glyco I IgG: <9 GPI IgG units (ref 0–20)
Beta-2-Glycoprotein I IgA: <9 GPI IgA units (ref 0–25)
Beta-2-Glycoprotein I IgM: <9 GPI IgM units (ref 0–32)

## 2020-06-09 LAB — BASIC METABOLIC PANEL
Anion gap: 4 — ABNORMAL LOW (ref 5–15)
BUN: 15 mg/dL (ref 6–20)
CO2: 25 mmol/L (ref 22–32)
Calcium: 7.6 mg/dL — ABNORMAL LOW (ref 8.9–10.3)
Chloride: 107 mmol/L (ref 98–111)
Creatinine, Ser: 1.18 mg/dL (ref 0.61–1.24)
GFR calc Af Amer: >60 mL/min (ref >60)
GFR calc non Af Amer: >60 mL/min (ref >60)
Glucose, Bld: 106 mg/dL — ABNORMAL HIGH (ref 70–99)
Potassium: 3.6 mmol/L (ref 3.5–5.1)
Sodium: 136 mmol/L (ref 135–145)

## 2020-06-09 LAB — GLUCOSE, CAPILLARY
Glucose-Capillary: 93 mg/dL (ref 70–99)
Glucose-Capillary: 108 mg/dL — ABNORMAL HIGH (ref 70–99)
Glucose-Capillary: 109 mg/dL — ABNORMAL HIGH (ref 70–99)
Glucose-Capillary: 104 mg/dL — ABNORMAL HIGH (ref 70–99)

## 2020-06-09 LAB — HEPARIN LEVEL (UNFRACTIONATED)
Heparin Unfractionated: <0.1 IU/mL — ABNORMAL LOW (ref 0.30–0.70)
Heparin Unfractionated: <0.1 IU/mL — ABNORMAL LOW (ref 0.30–0.70)

## 2020-06-09 LAB — HOMOCYSTEINE: Homocysteine: 14.5 umol/L (ref 0.0–14.5)

## 2020-06-09 LAB — FIBRINOGEN
Fibrinogen: 60 mg/dL — CL (ref 210–475)
Fibrinogen: 73 mg/dL — CL (ref 210–475)

## 2020-06-09 LAB — APTT
aPTT: 53 seconds — ABNORMAL HIGH (ref 24–36)
aPTT: 49 seconds — ABNORMAL HIGH (ref 24–36)
aPTT: 47 seconds — ABNORMAL HIGH (ref 24–36)
aPTT: 44 seconds — ABNORMAL HIGH (ref 24–36)

## 2020-06-09 MED ORDER — ARGATROBAN 50 MG/50ML IV SOLN
0.4500 ug/kg/min | INTRAVENOUS | Status: DC
  Administered 2020-06-10 – 2020-06-11 (×2): 0.45 ug/kg/min via INTRAVENOUS
  Filled 2020-06-09 (×2): qty 50

## 2020-06-09 MED ORDER — CEFAZOLIN SODIUM-DEXTROSE 2-4 GM/100ML-% IV SOLN
2.0000 g | INTRAVENOUS | Status: AC
  Filled 2020-06-09: qty 100

## 2020-06-09 MED ORDER — SODIUM CHLORIDE 0.9 % IV SOLN: INTRAVENOUS | Status: DC

## 2020-06-09 MED ORDER — ARGATROBAN 50 MG/50ML IV SOLN
0.2500 ug/kg/min | INTRAVENOUS | Status: DC
  Administered 2020-06-09: 0.25 ug/kg/min via INTRAVENOUS
  Filled 2020-06-09 (×2): qty 50

## 2020-06-09 NOTE — Consult Note (Signed)
NAME: Austin Glover  DOB: 11-Apr-1981  MRN: [id number]  Date/Time: 06/09/2020 6:26 PM  REQUESTING PROVIDER: Dr. 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 Austin Glover is a 39 y.o. male born in [place] but had been in the [place] for the past 20 years presents to the ED on 06/04/2020 with bilateral leg pain and swelling. 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  He had been to work on Monday.  He had some sniffles.  Over the weekend there was a party for his niece who was 67 years old.  So patient was walking and enjoying a lot.  He did not drink any alcohol.  On Monday he had some runny nose and when he went to work he did not feel well.  On Tuesday he stayed home but started having some back pain in the evening on the right side and he took ibuprofen and felt better.  He thought it was sciatica.  His wife also had given a massage with icy cold topical cream.  But around 4 AM when he tried to go to the bathroom his legs felt so heavy and painful and EMS had to be called.  And he found that the leg especially the right 1 was swelling very quickly.  As per patient and his wife he has not been eating as much as he used to do for the past 3 weeks.  He is feeling full quickly. Patient denies any fever, cough, shortness of breath, hematuria, diarrhea.  He does not take any medications on a regular basis.  He does not take any over-the-counter medicines or medications from [place].  He never went to [place] since he came to this country many years ago. He has not had any contact with tuberculosis. He has taken both his current vaccine 3/21 and 4/11. In the ED vitals were temperature of 97.5, BP 112/93, heart rate of 114.  Labs revealed a hemoglobin of 16, WBC of 18.9, creatinine of 1.89,And platelet of 178.  Lactate was 5.1. A CT abdomen and pelvis revealedAsymmetric flow to the lower extremities left greater than the right.  Dilated left common iliac vein  and iliac I will confluence.  Retroperitoneal 5 soft tissue mass containing coarse calcification abutting the right common iliac vein and distal IVC.  There was associated chronic obstructive right hydronephrosis and proximal ureterectasis with advanced right renal atrophy suggesting chronic process.  There was thick-walled urachal remnant with a soft tissue nodularity containing 2 foci of calcification. He was seen by vascular on 06/04/2020 for the initial diagnosis of acute thrombus bilaterally with asymmetrical flow.  He was taken for urgent angiogram which revealed patent left renal artery aorta iliac arteries.  The right renal artery was not seen.  And the right lower extremity demonstrated no significant stenosis or atherosclerosis in the right common femoral artery, profunda femoris artery, superficial femoral artery or popliteal artery.  The left lower extremity also did not demonstrate any significant stenosis or flow limitation in the arteries. 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  On 06/08/2020 he underwent IVC gram and bilateral lower extremity venogram and catheter directed thrombolysis with 6 mg of TPA to the common iliac veins, external iliac veins, common femoral veins and superficial femoral veins bilaterally.  Mechanical  thrombectomy to the IVC, bilateral common iliac veins and bilateral external iliac veins, bilateral common femoral veins and bilateral superficial femoral veins was done.  PTA of the left common and external iliac veins with 10 mm balloon.  PTA of the right common femoral vein with balloon and PTA of the IVC.  Placement of catheters for continuous thrombolytic therapy by Dr. Wyn Quaker. I am asked to see the patient to look for any infectious cause of the retroperitoneal  fibrosis. Patient has not taken any medication that would cause retroperitoneal fibrosis.   Past Surgical History:  Procedure Laterality Date  . LOWER EXTREMITY ANGIOGRAPHY Right 06/04/2020   Procedure: Lower Extremity Angiography;  Surgeon: Annice Needy, MD;  Location: [facility] INVASIVE CV LAB;  Service: Cardiovascular;  Laterality: Right;  . PERIPHERAL VASCULAR THROMBECTOMY Bilateral 06/08/2020   Procedure: PERIPHERAL VASCULAR THROMBECTOMY, BLE and IVC;  Surgeon: Annice Needy, MD;  Location: [facility] INVASIVE CV LAB;  Service: Cardiovascular;  Laterality: Bilateral;  . TONSILLECTOMY      Social History   Socioeconomic History  . Marital status: Married    Spouse name: Not on file  . Number of children: Not on file  . Years of education: Not on file  . Highest education level: Not on file  Occupational History  . Not on file  Tobacco Use  . Smoking status: Never Smoker  . Smokeless tobacco: Never Used  Vaping Use  . Vaping Use: Never used  Substance and Sexual Activity  . Alcohol use: Yes    Comment: occasional  . Drug use: Never  . Sexual activity: Yes  Other Topics Concern  . Not on file  Social History Narrative  . Not on file   Social Determinants of Health   Financial Resource Strain:   . Difficulty of Paying Living Expenses:   Food Insecurity:   . Worried About Programme researcher, broadcasting/film/video in the Last Year:   . Barista in the Last Year:   Transportation Needs:   . Freight forwarder (Medical):   Marland Kitchen Lack of Transportation (Non-Medical):   Physical Activity:   . Days of Exercise per Week:   . Minutes of Exercise per Session:   Stress:   . Feeling of Stress :   Social Connections:   . Frequency of Communication with Friends and Family:   . Frequency of Social Gatherings with Friends and Family:   . Attends Religious Services:   . Active Member of Clubs or Organizations:   . Attends Banker Meetings:   Marland Kitchen Marital Status:   Intimate Partner Violence:     . Fear of Current or Ex-Partner:   . Emotionally Abused:   Marland Kitchen Physically Abused:   . Sexually Abused:     Family History  Problem Relation Age of Onset  . Diabetes Mellitus II Father   . Kidney Stones Father    No Known Allergies  ? Current Facility-Administered Medications  Medication Dose Route Frequency Provider Last Rate Last Admin  . 0.9 %  sodium chloride infusion   Intravenous Continuous Annice Needy, MD 100 mL/hr at 06/09/20 1800 Rate Verify at 06/09/20 1800  . 0.9 %  sodium chloride infusion  250 mL Intravenous PRN Annice Needy, MD      . 0.9 %  sodium chloride infusion   Intravenous Continuous Dew, Marlow Baars, MD      . 0.9 %  sodium chloride infusion   Intravenous Continuous Dew, Marlow Baars, MD      .  0.9 %  sodium chloride infusion   Intravenous Continuous Annice Needyew, Jason S, MD 20 mL/hr at 06/08/20 1433 New Bag at 06/08/20 1433  . 0.9 %  sodium chloride infusion   Intravenous Continuous Annice Needyew, Jason S, MD 20 mL/hr at 06/08/20 1433 New Bag at 06/08/20 1433  . 0.9 %  sodium chloride infusion   Intravenous Continuous Annice Needyew, Jason S, MD      . acetaminophen (TYLENOL) tablet 650 mg  650 mg Oral Q6H PRN Annice Needyew, Jason S, MD   650 mg at 06/08/20 0452  . argatroban 1 mg/mL infusion  0.3 mcg/kg/min (Adjusted) Intravenous Continuous Foye DeerKluttz, Lisa G, RPH 1.67 mL/hr at 06/09/20 1604 0.3 mcg/kg/min at 06/09/20 1604  . ceFAZolin (ANCEF) IVPB 2g/100 mL premix  2 g Intravenous 30 min Pre-Op Annice Needyew, Jason S, MD      . Chlorhexidine Gluconate Cloth 2 % PADS 6 each  6 each Topical Daily Kathlen ModyAkula, Vijaya, MD   6 each at 06/09/20 1200  . docusate sodium (COLACE) capsule 100 mg  100 mg Oral BID Manuela SchwartzMorrison, Brenda, NP   100 mg at 06/09/20 1044  . morphine 2 MG/ML injection 2 mg  2 mg Intravenous Q3H PRN Annice Needyew, Jason S, MD   2 mg at 06/09/20 1717  . ondansetron (ZOFRAN) injection 4 mg  4 mg Intravenous Q6H PRN Annice Needyew, Jason S, MD   4 mg at 06/04/20 1608  . oxyCODONE (Oxy IR/ROXICODONE) immediate release tablet 5-10 mg  5-10 mg  Oral Q4H PRN Annice Needyew, Jason S, MD   10 mg at 06/09/20 1454  . pantoprazole (PROTONIX) EC tablet 40 mg  40 mg Oral Q0600 Annice Needyew, Jason S, MD   40 mg at 06/09/20 0519  . simethicone (MYLICON) chewable tablet 80 mg  80 mg Oral QID PRN Annice Needyew, Jason S, MD   80 mg at 06/07/20 1800  . sodium chloride flush (NS) 0.9 % injection 3 mL  3 mL Intravenous Q12H Annice Needyew, Jason S, MD   3 mL at 06/09/20 1044  . sodium chloride flush (NS) 0.9 % injection 3 mL  3 mL Intravenous PRN Annice Needyew, Jason S, MD   3 mL at 06/08/20 2209     Abtx:  Anti-infectives (From admission, onward)   Start     Dose/Rate Route Frequency Ordered Stop   06/09/20 1500  ceFAZolin (ANCEF) IVPB 2g/100 mL premix     Discontinue     2 g 200 mL/hr over 30 Minutes Intravenous 30 min pre-op 06/09/20 1449     06/08/20 1100  ceFAZolin (ANCEF) IVPB 2g/100 mL premix        2 g 200 mL/hr over 30 Minutes Intravenous 30 min pre-op 06/08/20 1044 06/08/20 1550   06/05/20 0000  ceFAZolin (ANCEF) IVPB 1 g/50 mL premix       Note to Pharmacy: To be given in specials   1 g 100 mL/hr over 30 Minutes Intravenous  Once 06/04/20 1439 06/04/20 1757   06/04/20 0945  ceFEPIme (MAXIPIME) 2 g in sodium chloride 0.9 % 100 mL IVPB        2 g 200 mL/hr over 30 Minutes Intravenous  Once 06/04/20 0939 06/04/20 1107   06/04/20 0945  metroNIDAZOLE (FLAGYL) IVPB 500 mg        500 mg 100 mL/hr over 60 Minutes Intravenous  Once 06/04/20 0939 06/04/20 1203   06/04/20 0945  vancomycin (VANCOCIN) IVPB 1000 mg/200 mL premix        1,000 mg 200 mL/hr over 60 Minutes Intravenous  Once 06/04/20 0939 06/04/20 1306      REVIEW OF SYSTEMS:  Const: negative fever, negative chills, negative weight loss Eyes: negative diplopia or visual changes, negative eye pain ENT: negative coryza, negative sore throat Resp: negative cough, hemoptysis, dyspnea Cards: negative for chest pain, palpitations, lower extremity edema GU: Reduction in urine output GI: Has abdominal fullness, but no abdominal  pain Skin: negative for rash and pruritus Heme: negative for easy bruising and gum/nose bleeding MS: As above Neurolo:negative for headaches, dizziness, vertigo, memory problems  Psych: negative for feelings of anxiety, depression  Endocrine: negative for thyroid, diabetes problems Allergy/Immunology-none Objective:  VITALS:  BP 136/88   Pulse (!) 109   Temp 98 F (36.7 C) (Oral)   Resp (!) 28   Ht 5\' 11"  (1.803 m)   Wt 119 kg   SpO2 94%   BMI 36.59 kg/m  PHYSICAL EXAM:  General: Alert, cooperative, no distress, appears stated age.  Head: Normocephalic, without obvious abnormality, atraumatic. Eyes: Conjunctivae clear, anicteric sclerae. Pupils are equal ENT Nares normal. No drainage or sinus tenderness. Lips, mucosa, and tongue normal. No Thrush Neck: Supple, symmetrical, no adenopathy, thyroid: non tender no carotid bruit and no JVD. Back: No CVA tenderness. Lungs: Clear to auscultation bilaterally. No Wheezing or Rhonchi. No rales. Heart: Regular rate and rhythm, no murmur, rub or gallop.  Tachycardia Abdomen: Soft, non-tender,not distended. Bowel sounds normal. No masses Extremities: Bilateral edema legs below the hips. Bilateral venous catheters present Heme pigmentation of the legs       Skin: No rashes or lesions. Or bruising Lymph: Cervical, supraclavicular normal. Neurologic: Grossly non-focal Pertinent Labs Lab Results CBC    Component Value Date/Time   WBC 7.1 06/09/2020 0732   RBC 3.15 (L) 06/09/2020 0732   HGB 9.6 (L) 06/09/2020 0732   HCT 27.8 (L) 06/09/2020 0732   PLT 91 (L) 06/09/2020 0732   MCV 88.3 06/09/2020 0732   MCH 30.5 06/09/2020 0732   MCHC 34.5 06/09/2020 0732   RDW 12.1 06/09/2020 0732   LYMPHSABS 1.6 06/06/2020 1311   MONOABS 1.0 06/06/2020 1311   EOSABS 0.1 06/06/2020 1311   BASOSABS 0.0 06/06/2020 1311    CMP Latest Ref Rng & Units 06/09/2020 06/08/2020 06/07/2020  Glucose 70 - 99 mg/dL 06/09/2020) 008(Q) 761(P)  BUN 6 - 20 mg/dL  15 12 14   Creatinine 0.61 - 1.24 mg/dL 509(T 2.67  Sodium 135 - 145 mmol/L 136 135 134(L)  Potassium 3.5 - 5.1 mmol/L 3.6 3.7 4.0  Chloride 98 - 111 mmol/L 107 106 104  CO2 22 - 32 mmol/L 25 25 25   Calcium 8.9 - 10.3 mg/dL 7.6(L) 7.9(L) 8.0(L)  Total Protein 6.5 - 8.1 g/dL - - -  Total Bilirubin 0.3 - 1.2 mg/dL - - -  Alkaline Phos 38 - 126 U/L - - -  AST 15 - 41 U/L - - -  ALT 0 - 44 U/L - - -      Microbiology: Recent Results (from the past 240 hour(s))  Blood culture (routine x 2)     Status: None   Collection Time: 06/04/20  9:50 AM   Specimen: BLOOD  Result Value Ref Range Status   Specimen Description BLOOD LEFT [facility]  Final   Special Requests   Final    BOTTLES DRAWN AEROBIC AND ANAEROBIC Blood Culture adequate volume   Culture   Final    NO GROWTH 5 DAYS Performed at [facility], [address], [place], [facility] [address]  Report Status 06/09/2020 FINAL  Final  Blood culture (routine x 2)     Status: None   Collection Time: 06/04/20  9:50 AM   Specimen: BLOOD  Result Value Ref Range Status   Specimen Description BLOOD RIGHT HAND  Final   Special Requests   Final    BOTTLES DRAWN AEROBIC AND ANAEROBIC Blood Culture adequate volume   Culture   Final    NO GROWTH 5 DAYS Performed at [facility], [address], [place], [place] [address]    Report Status 06/09/2020 FINAL  Final  SARS Coronavirus 2 by RT PCR (hospital order, performed in [facility] hospital lab) Nasopharyngeal Nasopharyngeal Swab     Status: None   Collection Time: 06/04/20 10:18 AM   Specimen: Nasopharyngeal Swab  Result Value Ref Range Status   SARS Coronavirus 2 NEGATIVE NEGATIVE Final    Comment: (NOTE) SARS-CoV-2 target nucleic acids are NOT DETECTED.  The SARS-CoV-2 RNA is generally detectable in upper and lower respiratory specimens during the acute phase of infection. The lowest concentration of SARS-CoV-2 viral copies this assay can detect is 250 copies / mL.  A negative result does not preclude SARS-CoV-2 infection and should not be used as the sole basis for treatment or other patient management decisions.  A negative result may occur with improper specimen collection / handling, submission of specimen other than nasopharyngeal swab, presence of viral mutation(s) within the areas targeted by this assay, and inadequate number of viral copies (<250 copies / mL). A negative result must be combined with clinical observations, patient history, and epidemiological information.  Fact Sheet for Patients:   BoilerBrush.com.cy  Fact Sheet for Healthcare Providers: https://pope.com/  This test is not yet approved or  cleared by the [place] FDA and has been authorized for detection and/or diagnosis of SARS-CoV-2 by FDA under an Emergency Use Authorization (EUA).  This EUA will remain in effect (meaning this test can be used) for the duration of the COVID-19 declaration under Section 564(b)(1) of the Act, 21 U.S.C. section 360bbb-3(b)(1), unless the authorization is terminated or revoked sooner.  Performed at [facility], [address] [place], [place] [address]   MRSA PCR Screening     Status: None   Collection Time: 06/08/20  3:25 PM   Specimen: Nasopharyngeal  Result Value Ref Range Status   MRSA by PCR NEGATIVE NEGATIVE Final    Comment:        The GeneXpert MRSA Assay (FDA approved for NASAL specimens only), is one component of a comprehensive MRSA colonization surveillance program. It is not intended to diagnose MRSA infection nor to guide or monitor treatment for MRSA infections. Performed at [facility], [address] [place], [place] [address]     IMAGING RESULTS:  I have personally reviewed the films   ?The MRI confirms complete bilateral common iliac vein and external iliac vein thrombosis/DVT. There is also at least partial thrombosis of the  internal iliac veins as well as the primary collateral venous drainage along the left lumbar drainage pathway, in the setting of chronic infrarenal IVC stenosis/occlusion.  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 As was previously suggested on CT and the MRI preliminary, the leading differential diagnosis of the etiology is retroperitoneal fibrosis, either idiopathic or related to mycobacterial infection. Given the absence of focal soft tissue lesion, atypical carcinoid or other mesenchymal tumor is not favored. 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 The etiology for the retroperitoneal fibrosis is unknown now. It can be due to infection versus malignancy versus autoimmune condition. Infections like tuberculosis, histoplasma and actinomyces can cause retroperitoneal fibrosis . Other than blood work which may not  yield the answer we may  have to do a diagnostic laparoscopy and maybe take some tissue for testing. The noninfectious etiology includes malignancy, amyloidosis, IgG4 disease, autoimmune condition and idiopathic retroperitoneal fibrosis. Patient has not taken any medications like methysergide  which would cause retroperitoneal fibrosis. Many years ago he had attempted lumbar puncture for headache and he says there were at least 7-8 attempts.  Not sure whether that could have triggered this process which I doubt.   The next step would be to establish a diagnosis.  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 ?    Severe hydronephrosis of the  right kidney which is chronic. Is a very unusual presentation and patient being very young may benefit from a higher level of care. We will discuss with the care team regarding further testing to establish a diagnosis. Discussed the management with the patient and his wife at bedside. Note:  This document was prepared using Dragon voice recognition software and may include unintentional dictation errors.

## 2020-06-09 NOTE — Progress Notes (Signed)
ANTICOAGULATION CONSULT NOTE - Initial Consult  Pharmacy Consult for Argatroban Drip Indication: ischemic limbs due to bilateral occlusive DVTs  No Known Allergies  Patient Measurements: Height: 5\' 11"  (180.3 cm) Weight: 119 kg (262 lb 5.6 oz) IBW/kg (Calculated) : 75.3 Heparin Dosing Weight:    Vital Signs: Temp: 98.2 F (36.8 C) (07/20 1900) Temp Source: Oral (07/20 1900) BP: 132/85 (07/20 2200) Pulse Rate: 111 (07/20 2200)  Labs: Recent Labs    06/07/20 0439 06/07/20 0439 06/08/20 0317 06/08/20 1016 06/08/20 1931 06/08/20 1931 06/09/20 0121 06/09/20 0732 06/09/20 1241 06/09/20 1458 06/09/20 1752 06/09/20 2146  HGB 11.5*   < > 10.9*   < > 10.1*   < > 10.2* 9.6*  --   --   --   --   HCT 34.3*   < > 32.1*   < > 29.7*  --  29.0* 27.8*  --   --   --   --   PLT 85*   < > 104*   < > 97*  --  94* 91*  --   --   --   --   APTT 87*   < > 92*   < >  --   --   --   --    < > 49* 47* 44*  LABPROT  --   --  23.3*  --   --   --   --   --   --   --   --   --   INR  --   --  2.2*  --   --   --   --   --   --   --   --   --   HEPARINUNFRC  --   --   --    < > <0.10*  --  <0.10* <0.10*  --   --   --   --   CREATININE 1.11  --  1.22  --   --   --  1.18  --   --   --   --   --    < > = values in this interval not displayed.    Estimated Creatinine Clearance: 112.5 mL/min (by C-G formula based on SCr of 1.18 mg/dL).  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 Patient is s/p thrombectomy with overnight tPA infusion bilaterally. Fibrinogen level 73. The tPA has been stopped and patient to begin argatroban infusion. The patient had a decrease in platelets following Heparin infusion, HIT labs are pending.  7/20 12:41 aPTT 53, therapeutic x 1 7/20 14:58 aPTT 49, will increase 20% to 0.3 mcg/kg/min 7/20  1752 aPTT 47 7/20 2146 aPTT 44  Goal of Therapy:  aPTT 50 - 90 seconds Monitor platelets by anticoagulation protocol: Yes    Plan:  7/20 @ 2146 aPTT 44. Level remains slightly subtherapeutic. Will increase argatroban by ~ 30% to a rate of 0.45 mcg/kg/min. Will recheck aPTT in 2 hours. Daily CBC.  2147, PharmD Clinical Pharmacist 06/09/2020 10:53 PM

## 2020-06-09 NOTE — Progress Notes (Signed)
PROGRESS NOTE    Austin Glover  OIT:[id number] DOB: 1981/05/04 DOA: 06/04/2020 PCP: Patient, No Pcp Per    Chief Complaint  Patient presents with   Back Pain    Brief Narrative:   39 year old gentleman with no significant past medical history presented to [facility] ED for bilateral leg pain started on Monday.  He was initially admitted by vascular surgery on 06/04/2020 for evaluation of critical limb ischemia.  He underwent aortogram and bilateral lower extremity angiogram without any significant limb ischemia.  Patient was transferred to hospitalist service on 06/06/20 for evaluation of CHF and diabetes. He underwent CT angiogram on 7/15 of the abdominal aorta, and bilateral lower extremity with iliofemoral runoff showing  massive right hydronephrosis and proximal ureterectasis with advanced atrophy of the renal parenchyma. The right ureterectasis terminates at the level of a right retroperitoneal soft tissue mass containing coarse calcifications process abutting the right common iliac vein and distal IVC. Distal right ureter decompressed. Urinary bladder nondistended, mildly thick-walled. Thick-walled urachal remnant withsoft tissue nodularity containing 2 foci of calcification. Urology was consulted and suggested This is a chronic process as there is no remaining renal parenchyma, so no role for ureteral stent or nephrostomy tube. He will follow up with the patient in the clinic.  Ordered Venous duplex of the lower extremities on 7/17 and was found to have occlusive bilateral DVT's extending from  the common femoral veins through the visualized calf veins. As he continued to have tachycardia, a CT angiogram of the chest was done and is negative for PE. Vascular surgery continue to follow .  Vascular surgery performed  Mechanical thrombectomy and angioplasty of the lower extremities and IVC on 7/19 and continued alteplase till the morning of 7/20, and later switched to  Argatroban gtt this morning. Argatroban was initiated as his platelets dropped from 178000 on admission to 84000 (on 7/17), and HIT was suspected. HIT ordered and pending.  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 Differential include retroperitoneal fibrosis ,etiology probably idiopathic  Vs mycobacterial infection. Quantiferon gold sent on 06/08/20.  ID was consulted for assistance. Meanwhile. Auto immune work up like ANA, anca, anti smooth muscle antibody, thyroglobulin antibodies ordered. If work up is negative, recommend reaching out to Rheumatology for differential diagnosis.  Reached out to IR Dr Ardelle Anton, for biopsy of the RPF, but unfortunately there is no tissue to be biopsied.   Of note patient was born in [place] and migrated to [place] at the age of 4 and has remained in [place] in the last 20 years without any travel out of country. He denies any exposure to tuberculosis and denies any family history of TB.  He also reports about 15 year ago he was admitted to a hospital for severe stress headaches and underwent work up for meningitis, but was told he did not have meningitis.   Assessment & Plan:   Active Problems:   Ischemia of extremity   Leg pain   AKI (acute kidney injury) (HCC)   Leg DVT (deep venous thromboembolism), acute, bilateral (HCC)   RPF (retroperitoneal fibrosis)   Hydronephrosis of right kidney   Bilateral leg pain Probably secondary to bilateral occlusive DVTs.  Venous duplex of the lower extremities showed Bilateral occlusive deep vein thrombus extending from the common femoral veins through the visualized calf veins. Vascular surgery ruled out critical limb ischemia with negative angiogram  of the lower extremities.     Bilateral occlusive DVT'S involving the IVC : Underwent bilateral iliofemoral  thrombectomy today by vascular  surgery. On Argatroban gtt.  Plan for large stent placement to treat the iliac veins and IVC tomorrow.   AKI: Differential include dehydration, pre renal azotemia vs obstructive causes leading to right hydronephrosis .  On admission patient's creatinine was around 1.8 improved to 1.1 today.  Urine output adequate.  Continue to monitor the creatinine as he had contrast for the CT angiogram.    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 HIV screen is negative.  MRI of the abdomen ordered for further evaluation. Notified by Dr Ardelle Anton that pt has probably has retroperitoneal fibrosis, but no tissue for biopsy.  Quantiferon Gold ordered and pending.  ID consulted for recommendations.     Right sided hydronephrosis with right ureterectasis: Urology consulted, suggested, This is a chronic process as there is no remaining renal parenchyma, so no role for ureteral stent or nephrostomy tube Recommended outpatient follow up with him in the clinic after discharge.  His renal parameters have improved with IV fluids.    Mild leukocytosis:  Suspect reactive. Normalized today.    Thrombocytopenia:  Suspect from the heparin used on the day of admission vs ?  HIT ordered and pending. Platelets are improving.  Argatroban gtt ordered for DVT.    Acute anemia  of blood loss from the procedures:  RN Reported that patient had blood loss after the aortogram from the site.  Continue to monitor.  Hemoglobin  Dropped from 15 (on admission) to 12.8 to 12.4 to 11.5 to 10.9 to 9.6.    Hypomagnesemia:  Replaced.    Tachycardia:  EKG shows sinus tachycardia.  Unclear etiology. ? Leg pain vs dehydration vs PE.  EKG shows sinus tachycardia and t wave inversion in lead II, III AVF and lateral leads.  Pt denies chest pain or sob, palpitations.  He was empirically started on aspirin and prn metoprolol.  He remains  asymptomatic.   CT angiogram of the chest ruled out PE.   Echocardiogram shows Left ventricular ejection fraction, by estimation, is 60 to 65%. The  left ventricle has normal function. The left ventricle has no regional  wall motion abnormalities. There is mild left ventricular hypertrophy.  Left ventricular diastolic parameters  are consistent with Grade I diastolic dysfunction (impaired relaxation). No valve abnormalities.     DVT prophylaxis: Argatroban.  Code Status: full code.  Family Communication: discussed the plan with the patient and family.  Disposition:   Status is: Inpatient  Remains inpatient appropriate because:Hemodynamically unstable and Ongoing diagnostic testing needed not appropriate for outpatient work up   Dispo: The patient is from: Home              Anticipated d/c is to: pending.               Anticipated d/c date is: 2 days              Patient currently is not medically stable to d/c.       Consultants:   Urology  Vascular surgery     Procedures:  .           Ultrasound guidance for vascular access left femoral artery  Catheter placement into right SFA from left femoral approach  Aortogram and selective bilateral lower extremity angiogram  StarClose closure device left femoral  artery   Antimicrobials: none.    Subjective: Improving leg pains, but not resolved yet.  He appears to be in good spirits despite extensive work up and procedures.   Objective: Vitals:   06/09/20 1200 06/09/20 1300 06/09/20 1400 06/09/20 1500  BP: 115/77 (!) 131/92 140/90   Pulse: 98 (!) 104 (!) 120 (!) 107  Resp: (!) 24 (!) 26 (!) 21 17  Temp: 97.9 F (36.6 C)     TempSrc: Oral     SpO2: 97% 97% 99% 99%  Weight:      Height:        Intake/Output Summary (Last 24 hours) at 06/09/2020 1527 Last data filed at 06/09/2020 1456 Gross per 24 hour  Intake 6257.83 ml  Output 6700 ml  Net -442.17 ml   Filed Weights    06/08/20 0324 06/08/20 1525 06/09/20 0500  Weight: 115.9 kg 117 kg 119 kg    Examination:  General exam: Well-developed gentleman is alert and comfortable not in any kind of distress Respiratory system: Clear to auscultation bilaterally, no wheezing or rhonchi no tachypnea Cardiovascular system: S1-S2 heard, tachycardic, no JVD, bilateral lower extremity edema Gastrointestinal system: Abdomen is soft, nontender, nondistended, bowel sounds normal Central nervous system: Alert and oriented, grossly nonfocal Extremities: Bilateral leg edema and tenderness present Skin: No rashes seen Psychiatry: Mood is appropriate    Data Reviewed: I have personally reviewed following labs and imaging studies  CBC: Recent Labs  Lab 06/04/20 0756 06/05/20 0521 06/06/20 1311 06/07/20 0439 06/08/20 0317 06/08/20 1540 06/08/20 1931 06/09/20 0121 06/09/20 0732  WBC 18.9*   < > 9.3   < > 7.7 12.7* 10.3 7.6 7.1  NEUTROABS 14.0*  --  6.6  --   --   --   --   --   --   HGB 16.0   < > 12.4*   < > 10.9* 11.2* 10.1* 10.2* 9.6*  HCT 47.4   < > 35.2*   < > 32.1* 33.7* 29.7* 29.0* 27.8*  MCV 88.3   < > 85.6   < > 87.7 89.4 88.4 87.1 88.3  PLT 178   < > 84*   < > 104* 97* 97* 94* 91*   < > = values in this interval not displayed.    Basic Metabolic Panel: Recent Labs  Lab 06/05/20 0521 06/05/20 0521 06/05/20 2339 06/06/20 0351 06/07/20 0439 06/08/20 0317 06/09/20 0121  NA 137   < > 131* 132* 134* 135 136  K 4.7   < > 4.2 4.0 4.0 3.7 3.6  CL 109   < > 102 104 104 106 107  CO2 19*   < > 23 22 25 25 25   GLUCOSE 131*   < > 118* 120* 117* 100* 106*  BUN 21*   < > 19 17 14 12 15   CREATININE 1.57*   < > 1.62* 1.45* 1.11 1.22 1.18  CALCIUM 8.4*   < > 8.5* 8.5* 8.0* 7.9* 7.6*  MG 1.6*  --   --  2.1  --   --   --    < > = values in this interval not displayed.    GFR: Estimated Creatinine Clearance: 112.5 mL/min (by C-G formula based on SCr of 1.18 mg/dL).  Liver Function Tests: Recent Labs    Lab 06/04/20 0756 06/06/20 0351  AST 32 32  ALT 33 24  ALKPHOS 90 61  BILITOT 1.2 1.1  PROT 8.2* 6.3*  ALBUMIN 4.5 3.4*    CBG:  Recent Labs  Lab 06/08/20 1108 06/08/20 1528 06/08/20 2201 06/09/20 0716 06/09/20 1123  GLUCAP 96 118* 116* 93 108*     Recent Results (from the past 240 hour(s))  Blood culture (routine x 2)     Status: None   Collection Time: 06/04/20  9:50 AM   Specimen: BLOOD  Result Value Ref Range Status   Specimen Description BLOOD LEFT [facility]  Final   Special Requests   Final    BOTTLES DRAWN AEROBIC AND ANAEROBIC Blood Culture adequate volume   Culture   Final    NO GROWTH 5 DAYS Performed at [facility], [address] [place], [place] [address]    Report Status 06/09/2020 FINAL  Final  Blood culture (routine x 2)     Status: None   Collection Time: 06/04/20  9:50 AM   Specimen: BLOOD  Result Value Ref Range Status   Specimen Description BLOOD RIGHT HAND  Final   Special Requests   Final    BOTTLES DRAWN AEROBIC AND ANAEROBIC Blood Culture adequate volume   Culture   Final    NO GROWTH 5 DAYS Performed at [facility], [address], [place], [place] [address]    Report Status 06/09/2020 FINAL  Final  SARS Coronavirus 2 by RT PCR (hospital order, performed in [facility] hospital lab) Nasopharyngeal Nasopharyngeal Swab     Status: None   Collection Time: 06/04/20 10:18 AM   Specimen: Nasopharyngeal Swab  Result Value Ref Range Status   SARS Coronavirus 2 NEGATIVE NEGATIVE Final    Comment: (NOTE) SARS-CoV-2 target nucleic acids are NOT DETECTED.  The SARS-CoV-2 RNA is generally detectable in upper and lower respiratory specimens during the acute phase of infection. The lowest concentration of SARS-CoV-2 viral copies this assay can detect is 250 copies / mL. A negative result does not preclude SARS-CoV-2 infection and should not be used as the sole basis for treatment or other patient management decisions.  A negative  result may occur with improper specimen collection / handling, submission of specimen other than nasopharyngeal swab, presence of viral mutation(s) within the areas targeted by this assay, and inadequate number of viral copies (<250 copies / mL). A negative result must be combined with clinical observations, patient history, and epidemiological information.  Fact Sheet for Patients:   BoilerBrush.com.cy  Fact Sheet for Healthcare Providers: https://pope.com/  This test is not yet approved or  cleared by the [place] FDA and has been authorized for detection and/or diagnosis of SARS-CoV-2 by FDA under an Emergency Use Authorization (EUA).  This EUA will remain in effect (meaning this test can be used) for the duration of the COVID-19 declaration under Section 564(b)(1) of the Act, 21 U.S.C. section 360bbb-3(b)(1), unless the authorization is terminated or revoked sooner.  Performed at [facility], [address] [place], [place] [address]   MRSA PCR Screening     Status: None   Collection Time: 06/08/20  3:25 PM   Specimen: Nasopharyngeal  Result Value Ref Range Status   MRSA by PCR NEGATIVE NEGATIVE Final    Comment:        The GeneXpert MRSA Assay (FDA approved for NASAL specimens only), is one component of a comprehensive MRSA colonization surveillance program. It is not intended to diagnose MRSA infection nor to guide or monitor treatment for MRSA infections. Performed at [facility], [address], [place], [place] [address]          Radiology Studies: PERIPHERAL  VASCULAR CATHETERIZATION  Result Date: 06/08/2020 See op note       Scheduled Meds:  Chlorhexidine Gluconate Cloth  6 each Topical Daily   docusate sodium  100 mg Oral BID   pantoprazole  40 mg Oral Q0600   sodium chloride flush  3 mL Intravenous Q12H   Continuous Infusions:  sodium chloride 100 mL/hr at  06/09/20 1400   sodium chloride     sodium chloride     sodium chloride     sodium chloride 20 mL/hr at 06/08/20 1433   sodium chloride 20 mL/hr at 06/08/20 1433   sodium chloride     argatroban      ceFAZolin (ANCEF) IV       LOS: 4 days        Kathlen ModyVijaya Shlonda Dolloff, MD [facility]   To contact the attending provider between 7A-7P or the covering provider during after hours 7P-7A, please log into the web site [email or web address] and access using universal [place] password for that web site. If you do not have the password, please call the hospital operator.  06/09/2020, 3:27 PM

## 2020-06-09 NOTE — Progress Notes (Signed)
Paged Dr. Gilda Crease to alert of fibrinogen level of 60. Per Dr. Gilda Crease no new orders at this time. Continue with alteplase infusion at 0.25mg /hr.

## 2020-06-09 NOTE — Progress Notes (Addendum)
ANTICOAGULATION CONSULT NOTE - Initial Consult  Pharmacy Consult for Argatroban Drip Indication: ischemic limbs due to bilateral occlusive DVTs  No Known Allergies  Patient Measurements: Height: 5\' 11"  (180.3 cm) Weight: 119 kg (262 lb 5.6 oz) IBW/kg (Calculated) : 75.3 Heparin Dosing Weight:    Vital Signs: Temp: 98 F (36.7 C) (07/20 1600) Temp Source: Oral (07/20 1600) BP: 136/88 (07/20 1800) Pulse Rate: 109 (07/20 1800)  Labs: Recent Labs    06/07/20 0439 06/07/20 0439 06/08/20 0317 06/08/20 1016 06/08/20 1931 06/08/20 1931 06/09/20 0121 06/09/20 0732 06/09/20 1241 06/09/20 1458 06/09/20 1752  HGB 11.5*   < > 10.9*   < > 10.1*   < > 10.2* 9.6*  --   --   --   HCT 34.3*   < > 32.1*   < > 29.7*  --  29.0* 27.8*  --   --   --   PLT 85*   < > 104*   < > 97*  --  94* 91*  --   --   --   APTT 87*   < > 92*   < >  --   --   --   --  53* 49* 47*  LABPROT  --   --  23.3*  --   --   --   --   --   --   --   --   INR  --   --  2.2*  --   --   --   --   --   --   --   --   HEPARINUNFRC  --   --   --    < > <0.10*  --  <0.10* <0.10*  --   --   --   CREATININE 1.11  --  1.22  --   --   --  1.18  --   --   --   --    < > = values in this interval not displayed.    Estimated Creatinine Clearance: 112.5 mL/min (by C-G formula based on SCr of 1.18 mg/dL).   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 Patient is s/p thrombectomy with overnight tPA infusion bilaterally. Fibrinogen level 73. The tPA has been stopped and patient to begin argatroban infusion. The patient had a decrease in platelets following Heparin infusion, HIT labs are pending.  7/20 12:41 aPTT 53, therapeutic x 1 7/20 14:58 aPTT 49, will increase 20% to 0.3 mcg/kg/min 7/20  1752 aPTT 47  Goal of Therapy:  aPTT 50 - 90 seconds Monitor platelets by anticoagulation protocol: Yes   Plan:  7/20 @ 1752 aPTT 47. Level remains slightly subtherapeutic. Will  increase argatroban by ~ 20% to a rate of 0.35 mcg/kg/min. Will recheck aPTT in 2 hours. Daily CBC.  8/20, PharmD, BCPS Clinical Pharmacist 06/09/2020 7:25 PM

## 2020-06-09 NOTE — Progress Notes (Signed)
Lab called with a critical fibrinogen. Contacted Dr. Wyn Quaker and verbal orders were given to stop the tPA and place an order for argatroban at 0.25 mg/hr.

## 2020-06-09 NOTE — Progress Notes (Signed)
Per Dr. Wyn Quaker, argatroban is to run systemically instead of through lysis caths. Same switched to pt's PIV. NS continued through bilateral caths.

## 2020-06-09 NOTE — Progress Notes (Signed)
ANTICOAGULATION CONSULT NOTE - Initial Consult  Pharmacy Consult for Argatroban Drip Indication: ischemic limbs due to bilateral occlusive DVTs  No Known Allergies  Patient Measurements: Height: 5\' 11"  (180.3 cm) Weight: 119 kg (262 lb 5.6 oz) IBW/kg (Calculated) : 75.3 Heparin Dosing Weight:    Vital Signs: Temp: 97.9 F (36.6 C) (07/20 1200) Temp Source: Oral (07/20 1200) BP: 131/92 (07/20 1300) Pulse Rate: 104 (07/20 1300)  Labs: Recent Labs    06/07/20 0439 06/07/20 0439 06/08/20 0317 06/08/20 1016 06/08/20 1540 06/08/20 1931 06/08/20 1931 06/09/20 0121 06/09/20 0732 06/09/20 1241  HGB 11.5*   < > 10.9*  --    < > 10.1*   < > 10.2* 9.6*  --   HCT 34.3*   < > 32.1*  --    < > 29.7*  --  29.0* 27.8*  --   PLT 85*   < > 104*  --    < > 97*  --  94* 91*  --   APTT 87*   < > 92* 81*  --   --   --   --   --  53*  LABPROT  --   --  23.3*  --   --   --   --   --   --   --   INR  --   --  2.2*  --   --   --   --   --   --   --   HEPARINUNFRC  --   --   --   --    < > <0.10*  --  <0.10* <0.10*  --   CREATININE 1.11  --  1.22  --   --   --   --  1.18  --   --    < > = values in this interval not displayed.    Estimated Creatinine Clearance: 112.5 mL/min (by C-G formula based on SCr of 1.18 mg/dL).   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 Patient is s/p thrombectomy with overnight tPA infusion bilaterally. Fibrinogen level 73. The tPA has been stopped and patient to begin argatroban infusion. The patient had a decrease in platelets following Heparin infusion, HIT labs are pending.  7/20 12:41 aPTT 53, therapeutic x 1  Goal of Therapy:  aPTT 50 - 90 seconds Monitor platelets by anticoagulation protocol: Yes   Plan:  7/20 aPTT therapeutic. Will continue argatroban at 0.27mcg/kg/min. Will recheck aPTT in 2 hours for confirmation. Daily CBC.  32m, PharmD, BCPS 06/09/2020 1:29 PM

## 2020-06-09 NOTE — Progress Notes (Signed)
ANTICOAGULATION CONSULT NOTE - Initial Consult  Pharmacy Consult for Argatroban Drip Indication: ischemic limbs due to bilateral occlusive DVTs  No Known Allergies  Patient Measurements: Height: 5\' 11"  (180.3 cm) Weight: 119 kg (262 lb 5.6 oz) IBW/kg (Calculated) : 75.3 Heparin Dosing Weight:    Vital Signs: Temp: 97.9 F (36.6 C) (07/20 1200) Temp Source: Oral (07/20 1200) BP: 140/90 (07/20 1400) Pulse Rate: 107 (07/20 1500)  Labs: Recent Labs    06/07/20 0439 06/07/20 0439 06/08/20 0317 06/08/20 0317 06/08/20 1016 06/08/20 1540 06/08/20 1931 06/08/20 1931 06/09/20 0121 06/09/20 0732 06/09/20 1241 06/09/20 1458  HGB 11.5*   < > 10.9*  --   --    < > 10.1*   < > 10.2* 9.6*  --   --   HCT 34.3*   < > 32.1*  --   --    < > 29.7*  --  29.0* 27.8*  --   --   PLT 85*   < > 104*  --   --    < > 97*  --  94* 91*  --   --   APTT 87*   < > 92*   < > 81*  --   --   --   --   --  53* 49*  LABPROT  --   --  23.3*  --   --   --   --   --   --   --   --   --   INR  --   --  2.2*  --   --   --   --   --   --   --   --   --   HEPARINUNFRC  --   --   --   --   --    < > <0.10*  --  <0.10* <0.10*  --   --   CREATININE 1.11  --  1.22  --   --   --   --   --  1.18  --   --   --    < > = values in this interval not displayed.    Estimated Creatinine Clearance: 112.5 mL/min (by C-G formula based on SCr of 1.18 mg/dL).   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 Patient is s/p thrombectomy with overnight tPA infusion bilaterally. Fibrinogen level 73. The tPA has been stopped and patient to begin argatroban infusion. The patient had a decrease in platelets following Heparin infusion, HIT labs are pending.  7/20 12:41 aPTT 53, therapeutic x 1 7/20 14:58 aPTT 49, will increase 20% to 0.3 mcg/kg/min  Goal of Therapy:  aPTT 50 - 90 seconds Monitor platelets by anticoagulation protocol: Yes   Plan:  7/20 1500 aPTT slightly  subtherapeutic. Will increase argatroban by 20% to a rate of 0.3 mcg/kg/min. Will recheck aPTT in 2 hours. Daily CBC.  8/20, PharmD, BCPS 06/09/2020 3:36 PM

## 2020-06-09 NOTE — Progress Notes (Signed)
ANTICOAGULATION CONSULT NOTE - Initial Consult  Pharmacy Consult for Argatroban Drip Indication: ischemic limbs due to bilateral occlusive DVTs  No Known Allergies  Patient Measurements: Height: 5\' 11"  (180.3 cm) Weight: 119 kg (262 lb 5.6 oz) IBW/kg (Calculated) : 75.3 Heparin Dosing Weight:    Vital Signs: Temp: 98.8 F (37.1 C) (07/20 0400) Temp Source: Oral (07/20 0400) BP: 124/79 (07/20 0600) Pulse Rate: 114 (07/20 0600)  Labs: Recent Labs    06/07/20 0439 06/07/20 0439 06/08/20 0317 06/08/20 1016 06/08/20 1540 06/08/20 1931 06/08/20 1931 06/09/20 0121 06/09/20 0732  HGB 11.5*   < > 10.9*  --    < > 10.1*   < > 10.2* 9.6*  HCT 34.3*   < > 32.1*  --    < > 29.7*  --  29.0* 27.8*  PLT 85*   < > 104*  --    < > 97*  --  94* 91*  APTT 87*  --  92* 81*  --   --   --   --   --   LABPROT  --   --  23.3*  --   --   --   --   --   --   INR  --   --  2.2*  --   --   --   --   --   --   HEPARINUNFRC  --   --   --   --    < > <0.10*  --  <0.10* <0.10*  CREATININE 1.11  --  1.22  --   --   --   --  1.18  --    < > = values in this interval not displayed.    Estimated Creatinine Clearance: 112.5 mL/min (by C-G formula based on SCr of 1.18 mg/dL).   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 Patient is s/p thrombectomy with overnight tPA infusion bilaterally. Fibrinogen level 73. The tPA has been stopped and patient to begin argatroban infusion. The patient had a decrease in platelets following Heparin infusion, HIT labs are pending.  Goal of Therapy:  aPTT 50 - 90 seconds Monitor platelets by anticoagulation protocol: Yes   Plan:  Will begin argatroban at 0.58mcg/kg/min. Will check aPTT 2 hours after starting drip. Daily CBC.  32m, PharmD, BCPS 06/09/2020 11:17 AM

## 2020-06-09 NOTE — Progress Notes (Signed)
[facility]  Daily Progress Note   Subjective  -   Feeling a little better this morning.  Legs are somewhat less swollen although swelling is still present.  No major events overnight.  Fibrinogen was down below 100 this morning and TPA has been stopped.  Objective Vitals:   06/09/20 1000 06/09/20 1100 06/09/20 1200 06/09/20 1300  BP: 115/74  115/77 (!) 131/92  Pulse: (!) 109 (!) 103 98 (!) 104  Resp: 13 16 (!) 24 (!) 26  Temp:   97.9 F (36.6 C)   TempSrc:   Oral   SpO2: 97% 100% 97% 97%  Weight:      Height:        Intake/Output Summary (Last 24 hours) at 06/09/2020 1311 Last data filed at 06/09/2020 1200 Gross per 24 hour  Intake 6057.45 ml  Output 5625 ml  Net 432.45 ml    PULM  CTAB CV  RRR, slightly tachycardic VASC  2+ edema in both lower extremities although better than previous  Laboratory CBC    Component Value Date/Time   WBC 7.1 06/09/2020 0732   HGB 9.6 (L) 06/09/2020 0732   HCT 27.8 (L) 06/09/2020 0732   PLT 91 (L) 06/09/2020 0732    BMET    Component Value Date/Time   NA 136 06/09/2020 0121   K 3.6 06/09/2020 0121   CL 107 06/09/2020 0121   CO2 25 06/09/2020 0121   GLUCOSE 106 (H) 06/09/2020 0121   BUN 15 06/09/2020 0121   CREATININE 1.18 06/09/2020 0121   CALCIUM 7.6 (L) 06/09/2020 0121   GFRNONAA >60 06/09/2020 0121   GFRAA >60 06/09/2020 0121    Assessment/Planning: POD #1 status post mechanical thrombectomy and angioplasty extensively of both lower extremities and the IVC with continuous thrombolytic therapy overnight    Legs are better but still remained fairly swollen.  IVC occlusion still present.  Have ordered large stents to be able to treat the iliac veins and IVC and these should arrive to be able to do his procedure tomorrow.  Fibrinogen was down below 100 today so the TPA has been turned off and his argatroban has been resumed.  This can run until he returns to the angio suite tomorrow.  Continue to  elevate legs as tolerated.  Leave popliteal sheaths in place is that we will be our access for the procedure tomorrow.    Austin Glover  06/09/2020, 1:11 PM

## 2020-06-10 ENCOUNTER — Encounter
Admission: EM | Disposition: A | Discharge status: Home / Self Care | Payer: Self-pay | Source: Home / Self Care | Attending: Family Medicine

## 2020-06-10 ENCOUNTER — Encounter: Payer: Self-pay | Admitting: Vascular Surgery

## 2020-06-10 DIAGNOSIS — I82402 Acute embolism and thrombosis of unspecified deep veins of left lower extremity: Secondary | ICD-10-CM

## 2020-06-10 DIAGNOSIS — I82401 Acute embolism and thrombosis of unspecified deep veins of right lower extremity: Secondary | ICD-10-CM

## 2020-06-10 HISTORY — PX: PERIPHERAL VASCULAR THROMBECTOMY: CATH118306

## 2020-06-10 LAB — CBC
HCT: 28.2 % — ABNORMAL LOW (ref 39.0–52.0)
Hemoglobin: 9.8 g/dL — ABNORMAL LOW (ref 13.0–17.0)
MCH: 30.3 pg (ref 26.0–34.0)
MCHC: 34.8 g/dL (ref 30.0–36.0)
MCV: 87.3 fL (ref 80.0–100.0)
Platelets: 99 10*3/uL — ABNORMAL LOW (ref 150–400)
RBC: 3.23 MIL/uL — ABNORMAL LOW (ref 4.22–5.81)
RDW: 12 % (ref 11.5–15.5)
WBC: 7.4 10*3/uL (ref 4.0–10.5)
nRBC: 0 % (ref 0.0–0.2)

## 2020-06-10 LAB — MPO/PR-3 (ANCA) ANTIBODIES
ANCA Proteinase 3: <3.5 U/mL (ref 0.0–3.5)
Myeloperoxidase Abs: <9 U/mL (ref 0.0–9.0)

## 2020-06-10 LAB — CARDIOLIPIN ANTIBODIES, IGG, IGM, IGA
Anticardiolipin IgA: <9 APL U/mL (ref 0–11)
Anticardiolipin IgG: <9 GPL U/mL (ref 0–14)
Anticardiolipin IgM: <9 MPL U/mL (ref 0–12)

## 2020-06-10 LAB — APTT
aPTT: 84 seconds — ABNORMAL HIGH (ref 24–36)
aPTT: 84 seconds — ABNORMAL HIGH (ref 24–36)
aPTT: 67 seconds — ABNORMAL HIGH (ref 24–36)
aPTT: 77 seconds — ABNORMAL HIGH (ref 24–36)

## 2020-06-10 LAB — ANA: Anti Nuclear Antibody (ANA): POSITIVE — AB

## 2020-06-10 LAB — QUANTIFERON-TB GOLD PLUS (RQFGPL)
QuantiFERON Mitogen Value: >10 IU/mL
QuantiFERON Nil Value: 0 IU/mL
QuantiFERON TB1 Ag Value: 0.03 IU/mL
QuantiFERON TB2 Ag Value: 0 IU/mL

## 2020-06-10 LAB — GLUCOSE, CAPILLARY
Glucose-Capillary: 106 mg/dL — ABNORMAL HIGH (ref 70–99)
Glucose-Capillary: 103 mg/dL — ABNORMAL HIGH (ref 70–99)
Glucose-Capillary: 100 mg/dL — ABNORMAL HIGH (ref 70–99)
Glucose-Capillary: 137 mg/dL — ABNORMAL HIGH (ref 70–99)

## 2020-06-10 LAB — ANTI-SMOOTH MUSCLE ANTIBODY, IGG: F-Actin IgG: 6 Units (ref 0–19)

## 2020-06-10 LAB — KAPPA/LAMBDA LIGHT CHAINS
Kappa free light chain: 27.9 mg/L — ABNORMAL HIGH (ref 3.3–19.4)
Kappa, lambda light chain ratio: 1.08 (ref 0.26–1.65)
Lambda free light chains: 25.8 mg/L (ref 5.7–26.3)

## 2020-06-10 LAB — IGG 4: IgG, Subclass 4: 10 mg/dL (ref 2–96)

## 2020-06-10 LAB — QUANTIFERON-TB GOLD PLUS: QuantiFERON-TB Gold Plus: NEGATIVE

## 2020-06-10 LAB — PROTEIN C ACTIVITY: Protein C Activity: 99 % (ref 73–180)

## 2020-06-10 LAB — PROTEIN S ACTIVITY: Protein S Activity: 247 % — ABNORMAL HIGH (ref 63–140)

## 2020-06-10 LAB — PROTEIN S, TOTAL: Protein S Ag, Total: 94 % (ref 60–150)

## 2020-06-10 LAB — C-REACTIVE PROTEIN: CRP: 21.1 mg/dL — ABNORMAL HIGH (ref <1.0)

## 2020-06-10 LAB — THYROGLOBULIN ANTIBODY: Thyroglobulin Antibody: <1 IU/mL (ref 0.0–0.9)

## 2020-06-10 SURGERY — PERIPHERAL VASCULAR THROMBECTOMY
Anesthesia: Moderate Sedation | Laterality: Bilateral

## 2020-06-10 MED ORDER — HEPARIN SODIUM (PORCINE) 1000 UNIT/ML IJ SOLN
INTRAMUSCULAR | Status: AC
  Filled 2020-06-10: qty 1

## 2020-06-10 MED ORDER — MORPHINE SULFATE (PF) 2 MG/ML IV SOLN
2.0000 mg | INTRAVENOUS | Status: DC | PRN
  Administered 2020-06-10: 2 mg via INTRAVENOUS
  Administered 2020-06-10 – 2020-06-11 (×2): 4 mg via INTRAVENOUS
  Administered 2020-06-12: 3 mg via INTRAVENOUS
  Administered 2020-06-12: 2 mg via INTRAVENOUS
  Filled 2020-06-10: qty 2
  Filled 2020-06-10: qty 1
  Filled 2020-06-10 (×2): qty 2
  Filled 2020-06-10: qty 1

## 2020-06-10 MED ORDER — MIDAZOLAM HCL 2 MG/2ML IJ SOLN
INTRAMUSCULAR | Status: DC | PRN
  Administered 2020-06-10: 1 mg via INTRAVENOUS
  Administered 2020-06-10 (×2): 2 mg via INTRAVENOUS

## 2020-06-10 MED ORDER — CEFAZOLIN SODIUM-DEXTROSE 2-4 GM/100ML-% IV SOLN
INTRAVENOUS | Status: AC
  Filled 2020-06-10: qty 100

## 2020-06-10 MED ORDER — FENTANYL CITRATE (PF) 100 MCG/2ML IJ SOLN
INTRAMUSCULAR | Status: AC
  Filled 2020-06-10: qty 2

## 2020-06-10 MED ORDER — OXYCODONE HCL 5 MG PO TABS
5.0000 mg | ORAL_TABLET | ORAL | Status: DC | PRN
  Administered 2020-06-10 – 2020-06-13 (×10): 10 mg via ORAL
  Administered 2020-06-13 – 2020-06-14 (×3): 5 mg via ORAL
  Administered 2020-06-15: 10 mg via ORAL
  Filled 2020-06-10: qty 2
  Filled 2020-06-10 (×2): qty 1
  Filled 2020-06-10 (×10): qty 2
  Filled 2020-06-10: qty 1

## 2020-06-10 MED ORDER — FENTANYL CITRATE (PF) 100 MCG/2ML IJ SOLN
INTRAMUSCULAR | Status: DC | PRN
  Administered 2020-06-10 (×4): 50 ug via INTRAVENOUS

## 2020-06-10 MED ORDER — MIDAZOLAM HCL 5 MG/5ML IJ SOLN
INTRAMUSCULAR | Status: AC
  Filled 2020-06-10: qty 5

## 2020-06-10 MED ORDER — MIDAZOLAM HCL 2 MG/2ML IJ SOLN
INTRAMUSCULAR | Status: AC
  Filled 2020-06-10: qty 2

## 2020-06-10 MED ORDER — IODIXANOL 320 MG/ML IV SOLN
INTRAVENOUS | Status: DC | PRN
  Administered 2020-06-10: 45 mL via INTRAVENOUS

## 2020-06-10 SURGICAL SUPPLY — 14 items
BALLN ATG 12X6X80 (BALLOONS) ×6
BALLN ATG 14X4X80 (BALLOONS) ×6
BALLOON ATG 12X6X80 (BALLOONS) IMPLANT
BALLOON ATG 14X4X80 (BALLOONS) IMPLANT
CANISTER PENUMBRA ENGINE (MISCELLANEOUS) ×2 IMPLANT
CATH BEACON 5 .035 65 KMP TIP (CATHETERS) ×2 IMPLANT
CATH INDIGO 12 HTORQ 115 (CATHETERS) ×2 IMPLANT
DEVICE PRESTO INFLATION (MISCELLANEOUS) ×4 IMPLANT
PACK ANGIOGRAPHY (CUSTOM PROCEDURE TRAY) ×3 IMPLANT
STENT LIFESTAR 14X80X80 (Permanent Stent) ×4 IMPLANT
SYR MEDRAD MARK V 150ML (SYRINGE) ×2 IMPLANT
TUBING CONTRAST HIGH PRESS 72 (TUBING) ×3 IMPLANT
WIRE J 3MM .035X145CM (WIRE) ×3 IMPLANT
WIRE MAGIC TORQUE 260C (WIRE) ×6 IMPLANT

## 2020-06-10 NOTE — Progress Notes (Signed)
ID Pt says legs less painful and swollen Underwent angio today  Mechanical thrombectomy to bilateral common femoral veins, external and common iliac veins, and IVC with the penumbra CAT 12 device 4.   Kissing balloon stent placements to bilateral common iliac veins and inferior vena cava with 14 mm diameter by 8 cm length stents postdilated with a 12 mm balloons proximally and 14 mm balloon distally  Patient Vitals for the past 24 hrs:  BP Temp Temp src Pulse Resp SpO2 Height Weight  06/10/20 0909 (!) 156/92 -- -- -- (!) 21 97 % -- --  06/10/20 0905 (!) 147/89 -- -- -- (!) 5 98 % -- --  06/10/20 0900 (!) 147/88 -- -- -- (!) 21 97 % -- --  06/10/20 0855 (!) 147/88 -- -- -- 19 99 % -- --  06/10/20 0850 (!) 149/90 -- -- -- (!) 24 97 % -- --  06/10/20 0845 (!) 149/89 -- -- -- (!) 22 95 % -- --  06/10/20 0840 (!) 144/87 -- -- -- 19 95 % -- --  06/10/20 0835 (!) 148/87 -- -- -- (!) 24 94 % -- --  06/10/20 0830 (!) 145/91 -- -- -- (!) 4 94 % -- --  06/10/20 0825 136/80 -- -- -- (!) 27 97 % -- --  06/10/20 0820 136/80 -- -- -- (!) 26 94 % -- --  06/10/20 0815 140/82 -- -- -- 17 99 % -- --  06/10/20 0810 138/84 -- -- -- 16 100 % -- --  06/10/20 0805 140/86 -- -- -- 16 99 % -- --  06/10/20 0720 (!) 131/94 99.2 F (37.3 C) Oral (!) 103 15 98 % _0  (1.803 m) 119.4 kg  06/10/20 0700 117/78 -- -- (!) 102 (!) 33 94 % -- --  06/10/20 0600 126/87 98.2 F (36.8 C) Axillary (!) 103 (!) 28 91 % -- --  06/10/20 0500 -- -- -- (!) 114 (!) 24 97 % -- --  06/10/20 0400 125/81 -- -- (!) 113 (!) 21 95 % -- --  06/10/20 0313 -- -- -- -- -- -- -- 119.4 kg  06/10/20 0300 134/87 -- -- (!) 106 (!) 26 95 % -- --  06/10/20 0200 126/88 -- -- (!) 110 (!) 29 95 % -- --  06/10/20 0100 128/82 98.6 F (37 C) Oral (!) 110 (!) 28 93 % -- --  06/10/20 0000 131/87 -- -- (!) 112 (!) 28 92 % -- --  06/09/20 2300 (!) 141/90 -- -- (!) 115 (!) 27 94 % -- --  06/09/20 2200 132/85 -- -- (!) 111 (!) 28 94 % -- --  06/09/20 2100  139/90 -- -- (!) 112 10 98 % -- --  06/09/20 2000 (!) 139/94 -- -- (!) 109 -- 97 % -- --  06/09/20 1900 133/88 98.2 F (36.8 C) Oral (!) 109 (!) 23 97 % -- --  06/09/20 1800 136/88 -- -- (!) 109 -- 94 % -- --  06/09/20 1710 -- -- -- (!) 105 -- 95 % -- --  06/09/20 1700 -- -- -- (!) 107 -- 96 % -- --  06/09/20 1600 133/82 98 F (36.7 C) Oral (!) 103 (!) 28 93 % -- --  06/09/20 1500 -- -- -- (!) 107 17 99 % -- --  06/09/20 1400 140/90 -- -- (!) 120 (!) 21 99 % -- --  06/09/20 1300 (!) 131/92 -- -- (!) 104 (!) 26 97 % -- --  06/09/20 1200 115/77  97.9 F (36.6 C) Oral 98 (!) 24 97 % -- --  06/09/20 1100 -- -- -- (!) 103 16 100 % -- --  06/09/20 1000 115/74 -- -- (!) 109 13 97 % -- --   O/e Awake and alert Legs b/l coban wrap  CBC Latest Ref Rng & Units 06/10/2020 06/09/2020 06/09/2020  WBC 4.0 - 10.5 K/uL 7.4 7.1 7.6  Hemoglobin 13.0 - 17.0 g/dL 9.8(L) 9.6(L) 10.2(L)  Hematocrit 39 - 52 % 28.2(L) 27.8(L) 29.0(L)  Platelets 150 - 400 K/uL 99(L) 91(L) 94(L)    CMP Latest Ref Rng & Units 06/09/2020 06/08/2020 06/07/2020  Glucose 70 - 99 mg/dL 106(H) 100(H) 117(H)  BUN 6 - 20 mg/dL _0 Creatinine 0.61 - 1.24 mg/dL 1.18 1.22 1.11  Sodium 135 - 145 mmol/L 136 135 134(L)  Potassium 3.5 - 5.1 mmol/L 3.6 3.7 4.0  Chloride 98 - 111 mmol/L 107 106 104  CO2 22 - 32 mmol/L _1 Calcium 8.9 - 10.3 mg/dL 7.6(L) 7.9(L) 8.0(L)  Total Protein 6.5 - 8.1 g/dL - - -  Total Bilirubin 0.3 - 1.2 mg/dL - - -  Alkaline Phos 38 - 126 U/L - - -  AST 15 - 41 U/L - - -  ALT 0 - 44 U/L - - -   quantiferon gold negative ESR  5>>19 CRP 0.9>>10.4>>21 IgG4- 10 (N) TSH 2.707 Kappa free light chain increased 27.9 ANA positive  7/15 BC- NG   IMAGING RESULTS:  I have personally reviewed the films   ?The MRI confirms complete bilateral common iliac vein and external iliac vein thrombosis/DVT. There is also at least partial thrombosis of the internal iliac veins as well as the primary collateral  venous drainage along the left lumbar drainage pathway, in the setting of chronic infrarenal IVC stenosis/occlusion.  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 As was previously suggested on CT and the MRI preliminary, the leading differential diagnosis of the etiology is retroperitoneal fibrosis, either idiopathic or related to mycobacterial infection. Given the absence of focal soft tissue lesion, atypical carcinoid or other mesenchymal tumor is not favored. 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 The etiology for the retroperitoneal fibrosis is unknown now. It can be due to infection versus malignancy versus autoimmune condition Vs idiopathic retroperitoneal fibrosis Infections like tuberculosis ( quantiferon gold neg) , histoplasma and actinomyces can cause retroperitoneal fibrosis .The noninfectious etiology includes malignancy, amyloidosis, IgG4 disease, autoimmune condition and idiopathic retroperitoneal fibrosis. Patient has not taken any medications like methysergide or betablockers, bromocriptine  which would cause retroperitoneal fibrosis.   Many years ago he had attempted lumbar puncture for headache and he says there were at least 7-8 attempts. I doubt  whether this  was the trigger for RPF .  As per uptodate if idiopathic RPF is confirmed by imaging then biopsy may not be needed. But if not sure of the diagnosis and secondary RPF is a concern then  a  Biopsy may be  needed  for pathology, special stains including afb, fungal, amyloid  As per vascular ,anticoagulation cannot be stopped for a prolonged time for biopsy. Also risk for bleeding is  increased with biopsy  Prednisone is the drug of choice for idiopathicRPF but we need to  r/o secondary causes first  This is  a very unusual presentation and patient being very young may benefit from a tertiary center with expertise on managing RPF.    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 ?    Severe hydronephrosis of the right kidney. ? decompression to preserve function Urology seen patient and thinks   Discussed the management with the patient and his wife at bedside. Discussed with care team.

## 2020-06-10 NOTE — Progress Notes (Signed)
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

## 2020-06-10 NOTE — Progress Notes (Signed)
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  No Known Allergies  Patient Measurements: Height: 5\' 11"  (180.3 cm) Weight: 119.4 kg (263 lb 3.7 oz) IBW/kg (Calculated) : 75.3 Heparin Dosing Weight:    Vital Signs: Temp: 99.2 F (37.3 C) (07/21 0720) Temp Source: Oral (07/21 0720) BP: 134/91 (07/21 1000) Pulse Rate: 103 (07/21 1000)  Labs: Recent Labs    06/08/20 0317 06/08/20 1016 06/08/20 1931 06/08/20 1931 06/09/20 0121 06/09/20 0121 06/09/20 0732 06/09/20 1241 06/10/20 0143 06/10/20 0411 06/10/20 1302  HGB 10.9*   < > 10.1*   < > 10.2*   < > 9.6*  --  9.8*  --   --   HCT 32.1*   < > 29.7*   < > 29.0*  --  27.8*  --  28.2*  --   --   PLT 104*   < > 97*   < > 94*  --  91*  --  99*  --   --   APTT 92*   < >  --   --   --   --   --    < > 84* 84* 67*  LABPROT 23.3*  --   --   --   --   --   --   --   --   --   --   INR 2.2*  --   --   --   --   --   --   --   --   --   --   HEPARINUNFRC  --    < > <0.10*  --  <0.10*  --  <0.10*  --   --   --   --   CREATININE 1.22  --   --   --  1.18  --   --   --   --   --   --    < > = values in this interval not displayed.    Estimated Creatinine Clearance: 112.6 mL/min (by C-G formula based on SCr of 1.18 mg/dL).  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 Patient is s/p thrombectomy with overnight tPA infusion bilaterally. Fibrinogen level 73. The tPA has been stopped and patient to begin argatroban infusion. The patient had a decrease in platelets following Heparin infusion, HIT labs are pending.  7/20 12:41 aPTT 53, therapeutic x 1 7/20 14:58 aPTT 49, will increase 20% to 0.3 mcg/kg/min 7/20  1752 aPTT 47 7/20 2146 aPTT 44 7/21 0143 aPTT 84, therapeutic, CBC stable 7/21 0411 aPTT 84, therapeutic x 2 7/21 1302 aPTT 67, therapeutic  Goal of Therapy:  aPTT 50 - 90 seconds Monitor platelets  by anticoagulation protocol: Yes   Plan:  7/21 1302 aPTT 67, therapeutic. Argatroban previously on hold for procedure, resumed approximately 1000 at previous rate. Will continue at 0.45 mcg/kg/min and recheck aPTT at 1500 to confirm. If aPTT remains therapeutic, will check once daily. CBC with morning labs.  8/21, PharmD Clinical Pharmacist 06/10/2020 1:57 PM

## 2020-06-10 NOTE — Progress Notes (Signed)
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  No Known Allergies  Patient Measurements: Height: 5\' 11"  (180.3 cm) Weight: 119.4 kg (263 lb 3.7 oz) IBW/kg (Calculated) : 75.3 Heparin Dosing Weight:    Vital Signs: Temp: 99.2 F (37.3 C) (07/21 0720) Temp Source: Oral (07/21 0720) BP: 135/88 (07/21 1400) Pulse Rate: 107 (07/21 1400)  Labs: Recent Labs    06/08/20 0317 06/08/20 1016 06/08/20 1931 06/08/20 1931 06/09/20 0121 06/09/20 0121 06/09/20 0732 06/09/20 1241 06/10/20 0143 06/10/20 0143 06/10/20 0411 06/10/20 1302 06/10/20 1459  HGB 10.9*   < > 10.1*   < > 10.2*   < > 9.6*  --  9.8*  --   --   --   --   HCT 32.1*   < > 29.7*   < > 29.0*  --  27.8*  --  28.2*  --   --   --   --   PLT 104*   < > 97*   < > 94*  --  91*  --  99*  --   --   --   --   APTT 92*   < >  --   --   --   --   --    < > 84*   < > 84* 67* 77*  LABPROT 23.3*  --   --   --   --   --   --   --   --   --   --   --   --   INR 2.2*  --   --   --   --   --   --   --   --   --   --   --   --   HEPARINUNFRC  --    < > <0.10*  --  <0.10*  --  <0.10*  --   --   --   --   --   --   CREATININE 1.22  --   --   --  1.18  --   --   --   --   --   --   --   --    < > = values in this interval not displayed.    Estimated Creatinine Clearance: 112.6 mL/min (by C-G formula based on SCr of 1.18 mg/dL).  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 Patient is s/p thrombectomy with overnight tPA infusion bilaterally. Fibrinogen level 73. The tPA has been stopped and patient to begin argatroban infusion. The patient had a decrease in platelets following Heparin infusion, HIT labs are pending.  7/20 12:41 aPTT 53, therapeutic x 1 7/20 14:58 aPTT 49, will increase 20% to 0.3 mcg/kg/min 7/20  1752 aPTT 47 7/20 2146 aPTT 44 7/21 0143 aPTT 84, therapeutic, CBC stable 7/21 0411 aPTT 84,  therapeutic x 2 7/21 1302 aPTT 67, therapeutic 7/21 1459 aPTT 77, therapeutic x 2  Goal of Therapy:  aPTT 50 - 90 seconds Monitor platelets by anticoagulation protocol: Yes   Plan:  7/21 1459 aPTT 77, therapeutic. Continue argatroban at 0.45 mcg/kg/min. aPTT and CBC with morning labs.  8/21, PharmD Clinical Pharmacist 06/10/2020 3:35 PM

## 2020-06-10 NOTE — H&P (Signed)
[facility] History & Physical Update  The patient was interviewed and re-examined.  The patient's previous History and Physical has been reviewed and is unchanged.  There is no change in the plan of care. We plan to proceed with the scheduled procedure.  Festus Barren, MD  06/10/2020, 8:11 AM

## 2020-06-10 NOTE — Progress Notes (Signed)
ANTICOAGULATION CONSULT NOTE -   Pharmacy Consult for Argatroban Drip Indication: ischemic limbs due to bilateral occlusive DVTs  No Known Allergies  Patient Measurements: Height: 5\' 11"  (180.3 cm) Weight: 119.4 kg (263 lb 3.7 oz) IBW/kg (Calculated) : 75.3 Heparin Dosing Weight:    Vital Signs: Temp: 98.6 F (37 C) (07/21 0100) Temp Source: Oral (07/21 0100) BP: 134/87 (07/21 0300) Pulse Rate: 106 (07/21 0300)  Labs: Recent Labs    06/08/20 0317 06/08/20 1016 06/08/20 1931 06/08/20 1931 06/09/20 0121 06/09/20 0121 06/09/20 0732 06/09/20 1241 06/09/20 2146 06/10/20 0143 06/10/20 0411  HGB 10.9*   < > 10.1*   < > 10.2*   < > 9.6*  --   --  9.8*  --   HCT 32.1*   < > 29.7*   < > 29.0*  --  27.8*  --   --  28.2*  --   PLT 104*   < > 97*   < > 94*  --  91*  --   --  99*  --   APTT 92*   < >  --   --   --   --   --    < > 44* 84* 84*  LABPROT 23.3*  --   --   --   --   --   --   --   --   --   --   INR 2.2*  --   --   --   --   --   --   --   --   --   --   HEPARINUNFRC  --    < > <0.10*  --  <0.10*  --  <0.10*  --   --   --   --   CREATININE 1.22  --   --   --  1.18  --   --   --   --   --   --    < > = values in this interval not displayed.    Estimated Creatinine Clearance: 112.6 mL/min (by C-G formula based on SCr of 1.18 mg/dL).  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 Patient is s/p thrombectomy with overnight tPA infusion bilaterally. Fibrinogen level 73. The tPA has been stopped and patient to begin argatroban infusion. The patient had a decrease in platelets following Heparin infusion, HIT labs are pending.  7/20 12:41 aPTT 53, therapeutic x 1 7/20 14:58 aPTT 49, will increase 20% to 0.3 mcg/kg/min 7/20  1752 aPTT 47 7/20 2146 aPTT 44 7/21 0143 aPTT 84, therapeutic, CBC stable 7/21 0411 aPTT 84, therapeutic x 2  Goal of Therapy:  aPTT 50 - 90 seconds Monitor platelets by anticoagulation protocol:  Yes   Plan:  7/21 @ 0411 aPTT 84. APTT is therapeutic x 2. Will Continue argatroban at rate of 0.45 mcg/kg/min.  Will check aPTT every 4 hours. Daily CBC.  8/21, PharmD Clinical Pharmacist 06/10/2020 4:46 AM

## 2020-06-10 NOTE — Progress Notes (Signed)
PROGRESS NOTE    Austin Glover  YNW:[id number] DOB: June 13, 1981 DOA: 06/04/2020 PCP: Patient, No Pcp Per   Brief Narrative: Austin Glover is a 39 y.o. male with no medical history presented secondary to leg pain and found to have significant LE VTE bilateral extending up to IVC in setting of retroperitoneal fibrosis.    Assessment & Plan:   Active Problems:   Ischemia of extremity   Leg pain   AKI (acute kidney injury) (HCC)   Leg DVT (deep venous thromboembolism), acute, bilateral (HCC)   RPF (retroperitoneal fibrosis)   Hydronephrosis of right kidney   Acute bilateral DVT Patient is s/p mechanical thrombectomy/thrombolysis with angioplasty of common iliac, external iliac, common femoral, superficial femoral veins bilaterally on 7/19 and recurrent thrombectomy including the IVC, bilateral common femoral, external/common iliac veins; stent placed to bilateral iliac veins and inferior vena cava. Procoagulation workup is negative so far although ANA is positive; Factor V leiden, prothrombin gen mutation, lupus anticoagulation pending. Kappa free light chains are elevated and SPEP pending. UPEP unremarkable. -Vascular surgery recommendations: anticoagulation with argatroban  Retroperitoneal fibrosis Unknown etiology. Possibly idiopathic but not definitive per imaging. General surgery consulted for biopsy but concern is patient may need to be transferred to a tertiary center for management, including biopsy. Biopsy complicated for patient need to be on anticoagulation secondary to above. Discussed with oncology and unlikely malignancy per their assessment. Quantiferon gold negative. -General surgery recommendations for consideration of biopsy  AKI No baseline. Initial creatinine of 1.89 which has now improved. In setting of hydronephrosis but secondary to chronic nature, unlikely etiology. Possibly prerenal in etiology.  Right hydronephrosis Right renal atrophy Secondary to above. Does  not appear right kidney is likely functioning. Urology consulted and no utility in stent placement secondary so kidney parenchymal loss/atrophy -Nephrology consult  Thrombocytopenia In setting of heparin drip. 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 Heparin discontinued and patient started on argatroban IV. Heparin induced platelet ab wnl so unlikely related to HITT. Platelets remain low but stable. -Daily CBC   DVT prophylaxis: Argatroban IV Code Status:   Code Status: Full Code Family Communication: Wife at bedside Disposition Plan: Discharge pending continued recommendations from specialists but will likely require transfer to tertiary center   Consultants:   Vascular surgery  General surgery  Infectious disease  Medical oncology  Urology  Procedures:   VASCULAR SURGERY OP (06/08/2020) PROCEDURE: 1. US guidance for vascular access to bilateral popliteal vein 2. Catheter placement into bilateral common iliac veins and IVC from bilateral popliteal approach 3. IVC gram and bilateral lower extremity venogram 4.   Catheter directed thrombolysis with 6 mg of TPA to the common iliac veins, external iliac veins, common femoral veins, and superficial femoral veins bilaterally 5. Mechanical thrombectomy to the IVC, bilateral common iliac veins, bilateral external iliac veins, bilateral common femoral veins, and bilateral superficial femoral veins with the penumbra CAT 12 device 6. PTA of left common and external iliac veins with 10 mm balloon 7.   PTA of the right common femoral vein, external and common iliac veins with 10 mm balloons 8.   PTA of the IVC with 10 and 14 mm balloons 9.   Placement of catheters for continuous thrombolytic therapy with a 90 cm total length 20 cm working length catheter in the left iliac veins and a 90 cm total length 50 cm working length catheter in the IVC, left common and external iliac veins, and left common and superficial femoral veins  VASCULAR SURGERY OP  (06/10/2020) PROCEDURE:  1. Catheter placement into IVC from bilateral popliteal approach 2. IVC gram and bilateral lower extremity venogram 3. Mechanical thrombectomy to bilateral common femoral veins, external and common iliac veins, and IVC with the penumbra CAT 12 device 4.   Kissing balloon stent placements to bilateral common iliac veins and inferior vena cava with 14 mm diameter by 8 cm length stents postdilated with a 12 mm balloons proximally and 14 mm balloon distally  Antimicrobials:  Vancomycin  Flagyl  Cefpime  Cefazolin    Subjective: Some pain in his sacrum from recent surgery. Otherwise no issues.  Interpreter: Oswaldo Done #[id number]  Objective: Vitals:   06/10/20 0400 06/10/20 0500 06/10/20 0600 06/10/20 0700  BP: 125/81  126/87 117/78  Pulse: (!) 113 (!) 114 (!) 103 (!) 102  Resp: (!) 21 (!) 24 (!) 28 (!) 33  Temp:   98.2 F (36.8 C)   TempSrc:   Axillary   SpO2: 95% 97% 91% 94%  Weight:      Height:        Intake/Output Summary (Last 24 hours) at 06/10/2020 0726 Last data filed at 06/10/2020 0700 Gross per 24 hour  Intake 3108.52 ml  Output 6400 ml  Net -3291.48 ml   Filed Weights   06/08/20 1525 06/09/20 0500 06/10/20 0313  Weight: 117 kg 119 kg 119.4 kg    Examination:  General exam: Appears calm and comfortable Respiratory system: Clear to auscultation. Respiratory effort normal. Cardiovascular system: S1 & S2 heard, RRR. No murmurs, rubs, gallops or clicks. Gastrointestinal system: Abdomen is nondistended, soft and nontender. No organomegaly or masses felt. Normal bowel sounds heard. Central nervous system: Alert and oriented. No focal neurological deficits. Musculoskeletal: No edema. No calf tenderness Skin: No cyanosis. Ecchymosis of pubis area.  Psychiatry: Judgement and insight appear normal. Mood & affect appropriate.     Data Reviewed: I have personally reviewed following labs and imaging studies  CBC Lab Results  Component  Value Date   WBC 7.4 06/10/2020   RBC 3.23 (L) 06/10/2020   HGB 9.8 (L) 06/10/2020   HCT 28.2 (L) 06/10/2020   MCV 87.3 06/10/2020   MCH 30.3 06/10/2020   PLT 99 (L) 06/10/2020   MCHC 34.8 06/10/2020   RDW 12.0 06/10/2020   LYMPHSABS 1.6 06/06/2020   MONOABS 1.0 06/06/2020   EOSABS 0.1 06/06/2020   BASOSABS 0.0 06/06/2020     Last metabolic panel Lab Results  Component Value Date   NA 136 06/09/2020   K 3.6 06/09/2020   CL 107 06/09/2020   CO2 25 06/09/2020   BUN 15 06/09/2020   CREATININE 1.18 06/09/2020   GLUCOSE 106 (H) 06/09/2020   GFRNONAA >60 06/09/2020   GFRAA >60 06/09/2020   CALCIUM 7.6 (L) 06/09/2020   PROT 6.3 (L) 06/06/2020   ALBUMIN 3.4 (L) 06/06/2020   BILITOT 1.1 06/06/2020   ALKPHOS 61 06/06/2020   AST 32 06/06/2020   ALT 24 06/06/2020   ANIONGAP 4 (L) 06/09/2020    CBG (last 3)  Recent Labs    06/09/20 1931 06/09/20 2226 06/10/20 0359  GLUCAP 104* 106* 103*     GFR: Estimated Creatinine Clearance: 112.6 mL/min (by C-G formula based on SCr of 1.18 mg/dL).  Coagulation Profile: Recent Labs  Lab 06/04/20 1018 06/08/20 0317  INR 1.2 2.2*    Recent Results (from the past 240 hour(s))  Blood culture (routine x 2)     Status: None   Collection Time: 06/04/20  9:50 AM   Specimen: BLOOD  Result Value Ref Range Status   Specimen Description BLOOD LEFT AC  Final   Special Requests   Final    BOTTLES DRAWN AEROBIC AND ANAEROBIC Blood Culture adequate volume   Culture   Final    NO GROWTH 5 DAYS Performed at [facility], [address] [place], [place] [address]    Report Status 06/09/2020 FINAL  Final  Blood culture (routine x 2)     Status: None   Collection Time: 06/04/20  9:50 AM   Specimen: BLOOD  Result Value Ref Range Status   Specimen Description BLOOD RIGHT HAND  Final   Special Requests   Final    BOTTLES DRAWN AEROBIC AND ANAEROBIC Blood Culture adequate volume   Culture   Final    NO GROWTH 5 DAYS Performed  at [facility], [address] [place], [place] [address]    Report Status 06/09/2020 FINAL  Final  SARS Coronavirus 2 by RT PCR (hospital order, performed in [facility] hospital lab) Nasopharyngeal Nasopharyngeal Swab     Status: None   Collection Time: 06/04/20 10:18 AM   Specimen: Nasopharyngeal Swab  Result Value Ref Range Status   SARS Coronavirus 2 NEGATIVE NEGATIVE Final    Comment: (NOTE) SARS-CoV-2 target nucleic acids are NOT DETECTED.  The SARS-CoV-2 RNA is generally detectable in upper and lower respiratory specimens during the acute phase of infection. The lowest concentration of SARS-CoV-2 viral copies this assay can detect is 250 copies / mL. A negative result does not preclude SARS-CoV-2 infection and should not be used as the sole basis for treatment or other patient management decisions.  A negative result may occur with improper specimen collection / handling, submission of specimen other than nasopharyngeal swab, presence of viral mutation(s) within the areas targeted by this assay, and inadequate number of viral copies (<250 copies / mL). A negative result must be combined with clinical observations, patient history, and epidemiological information.  Fact Sheet for Patients:   BoilerBrush.com.cy  Fact Sheet for Healthcare Providers: https://pope.com/  This test is not yet approved or  cleared by the [place] FDA and has been authorized for detection and/or diagnosis of SARS-CoV-2 by FDA under an Emergency Use Authorization (EUA).  This EUA will remain in effect (meaning this test can be used) for the duration of the COVID-19 declaration under Section 564(b)(1) of the Act, 21 U.S.C. section 360bbb-3(b)(1), unless the authorization is terminated or revoked sooner.  Performed at [facility], [address] [place], [place] [address]   MRSA PCR Screening     Status: None   Collection  Time: 06/08/20  3:25 PM   Specimen: Nasopharyngeal  Result Value Ref Range Status   MRSA by PCR NEGATIVE NEGATIVE Final    Comment:        The GeneXpert MRSA Assay (FDA approved for NASAL specimens only), is one component of a comprehensive MRSA colonization surveillance program. It is not intended to diagnose MRSA infection nor to guide or monitor treatment for MRSA infections. Performed at [facility], [address], [place], [place] [address]         Radiology Studies: PERIPHERAL VASCULAR CATHETERIZATION  Result Date: 06/08/2020 See op note       Scheduled Meds: . [MAR Hold] Chlorhexidine Gluconate Cloth  6 each Topical Daily  . [MAR Hold] docusate sodium  100 mg Oral BID  . [MAR Hold] pantoprazole  40 mg Oral Q0600  . [MAR Hold] sodium chloride flush  3 mL Intravenous Q12H   Continuous Infusions: . sodium chloride 100 mL/hr at 06/10/20 0700  . [MAR Hold] sodium chloride    . sodium chloride    . sodium chloride    . sodium chloride 20 mL/hr at 06/10/20 0700  . sodium chloride 20 mL/hr at 06/10/20 0700  . sodium chloride    . argatroban 0.45 mcg/kg/min (06/10/20 0700)  .  ceFAZolin (ANCEF) IV       LOS: 5 days     Jacquelin Hawking, MD [facility] 06/10/2020, 7:26 AM  If 7PM-7AM, please contact night-coverage [email or web address]

## 2020-06-10 NOTE — Consult Note (Signed)
Hematology/Oncology Consult note [facility] [phone] [phone]  Patient Care Team: Patient, No Pcp Per as PCP - General (General Practice)   Name of the patient: Austin Glover  [id number]  28-Dec-1980    Reason for consult: retroperitoneal fibrosis   Requesting physician: DR. Caleb Popp  Date of visit: 06/10/2020    History of presenting illness- Patient 39 year old Hispanic male.  No known past medical history.  History obtained with the help of online interpreter.  Patient was in his usual state of health until about 3 days ago when he started experiencing back pain for which she took as needed ibuprofen.  Back pain got better and this was followed by bilateral lower extremity pain and swelling and inability to walk which brought him to the ER.  Patient had CT angiogram which showed no aortic dissection.  Asymmetric flow to lower extremities left greater than right and acute thrombus/embolus could not be ruled out.  Dilated left common iliac vein and ileocaval confluence up to 3.2 cm diameter with papery narrowing of the IVC.  Right retroperitoneal soft tissue mass containing coarse calcifications abutting the right common iliac and distal IVC with associated chronic obstructive right hydronephrosis and proximal ureteric stasis with advanced right renal atrophy suggesting a chronic process.  This was followed by MRI abdomen which showed complete bilateral common iliac vein and external iliac vein thrombosis/DVT.  Partial thrombosis of the internal iliac vein as well as primary collateral venous drainage in the setting of chronic infrarenal IVC stenosis/occlusion leading differential diagnosis is retroperitoneal fibrosis either idiopathic or related to mycobacterial infection.  Given the absence of focal soft tissue lesion atypical carcinoid or other mesenchymal tumor is not favored.  CT angio chest was negative for PE.  Patient was seen by vascular  surgery and underwent mechanical thrombectomy of the bilateral common femoral veins external and common iliac veins with stent placements.  He is currently on argatroban drip.  Patient currently reports that he does have sensation in his bilateral lower extremities but it is altered.  Also has bilateral lower extremity swelling  Pain scale- 4   Review of systems- Review of Systems  Musculoskeletal:       Bilateral lower extremity pain and swelling    No Known Allergies  Patient Active Problem List   Diagnosis Date Noted  . AKI (acute kidney injury) (HCC) 06/09/2020  . Leg DVT (deep venous thromboembolism), acute, bilateral (HCC) 06/09/2020  . 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 Hydronephrosis of right kidney 06/09/2020  . Leg pain 06/05/2020  . Ischemia of extremity 06/04/2020     History reviewed. No pertinent past medical history.   Past Surgical History:  Procedure Laterality Date  . LOWER EXTREMITY ANGIOGRAPHY Right 06/04/2020   Procedure: Lower Extremity Angiography;  Surgeon: Annice Needy, MD;  Location: [facility] INVASIVE CV LAB;  Service: Cardiovascular;  Laterality: Right;  . PERIPHERAL VASCULAR THROMBECTOMY Bilateral 06/08/2020   Procedure: PERIPHERAL VASCULAR THROMBECTOMY, BLE and IVC;  Surgeon: Annice Needy, MD;  Location: [facility] INVASIVE CV LAB;  Service: Cardiovascular;  Laterality: Bilateral;  . PERIPHERAL VASCULAR THROMBECTOMY Bilateral 06/10/2020   Procedure: PERIPHERAL VASCULAR THROMBECTOMY;  Surgeon: Annice Needy, MD;  Location: [facility] INVASIVE CV LAB;  Service: Cardiovascular;  Laterality: Bilateral;  . TONSILLECTOMY      Social History   Socioeconomic History  . Marital status: Married    Spouse name: Not on file  . Number of children: Not on file  . Years of education:  Not on file  . Highest education level: Not on file  Occupational History  . Not on file  Tobacco Use  . Smoking status: Never Smoker  . Smokeless tobacco: Never Used  Vaping Use    . Vaping Use: Never used  Substance and Sexual Activity  . Alcohol use: Yes    Comment: occasional  . Drug use: Never  . Sexual activity: Yes  Other Topics Concern  . Not on file  Social History Narrative  . Not on file   Social Determinants of Health   Financial Resource Strain:   . Difficulty of Paying Living Expenses:   Food Insecurity:   . Worried About Programme researcher, broadcasting/film/video in the Last Year:   . Barista in the Last Year:   Transportation Needs:   . Freight forwarder (Medical):   Marland Kitchen Lack of Transportation (Non-Medical):   Physical Activity:   . Days of Exercise per Week:   . Minutes of Exercise per Session:   Stress:   . Feeling of Stress :   Social Connections:   . Frequency of Communication with Friends and Family:   . Frequency of Social Gatherings with Friends and Family:   . Attends Religious Services:   . Active Member of Clubs or Organizations:   . Attends Banker Meetings:   Marland Kitchen Marital Status:   Intimate Partner Violence:   . Fear of Current or Ex-Partner:   . Emotionally Abused:   Marland Kitchen Physically Abused:   . Sexually Abused:      Family History  Problem Relation Age of Onset  . Diabetes Mellitus II Father   . Kidney Stones Father      Current Facility-Administered Medications:  .  0.9 %  sodium chloride infusion, , Intravenous, Continuous, Dew, Marlow Baars, MD, Last Rate: 100 mL/hr at 06/10/20 0700, Rate Verify at 06/10/20 0700 .  acetaminophen (TYLENOL) tablet 650 mg, 650 mg, Oral, Q6H PRN, Annice Needy, MD, 650 mg at 06/08/20 0452 .  argatroban 1 mg/mL infusion, 0.45 mcg/kg/min (Adjusted), Intravenous, Continuous, Dew, Marlow Baars, MD, Last Rate: 2.51 mL/hr at 06/10/20 0700, 0.45 mcg/kg/min at 06/10/20 0700 .  Chlorhexidine Gluconate Cloth 2 % PADS 6 each, 6 each, Topical, Daily, Dew, Marlow Baars, MD, 6 each at 06/10/20 0556 .  docusate sodium (COLACE) capsule 100 mg, 100 mg, Oral, BID, Dew, Marlow Baars, MD, 100 mg at 06/09/20 2221 .  fentaNYL  (SUBLIMAZE) 100 MCG/2ML injection, , , ,  .  fentaNYL (SUBLIMAZE) 100 MCG/2ML injection, , , ,  .  midazolam (VERSED) 5 MG/5ML injection, , , ,  .  morphine 2 MG/ML injection 2-4 mg, 2-4 mg, Intravenous, Q3H PRN, Narda Bonds, MD, 4 mg at 06/10/20 1600 .  ondansetron (ZOFRAN) injection 4 mg, 4 mg, Intravenous, Q6H PRN, Annice Needy, MD, 4 mg at 06/04/20 1608 .  oxyCODONE (Oxy IR/ROXICODONE) immediate release tablet 5-10 mg, 5-10 mg, Oral, Q4H PRN, Narda Bonds, MD .  pantoprazole (PROTONIX) EC tablet 40 mg, 40 mg, Oral, Q0600, Annice Needy, MD, 40 mg at 06/10/20 0506 .  simethicone (MYLICON) chewable tablet 80 mg, 80 mg, Oral, QID PRN, Annice Needy, MD, 80 mg at 06/07/20 1800   Physical exam:  Vitals:   06/10/20 0945 06/10/20 1000 06/10/20 1200 06/10/20 1400  BP: (!) 135/93 (!) 134/91  135/88  Pulse: 100 (!) 103 91 (!) 107  Resp: 18 (!) Temp:  TempSrc:      SpO2: 97% 98% 98% 95%  Weight:      Height:       Physical Exam Constitutional:      General: He is not in acute distress. Cardiovascular:     Rate and Rhythm: Normal rate and regular rhythm.     Heart sounds: Normal heart sounds.  Pulmonary:     Effort: Pulmonary effort is normal.     Breath sounds: Normal breath sounds.  Abdominal:     General: Bowel sounds are normal.     Palpations: Abdomen is soft.  Musculoskeletal:     Comments: Bilateral lower extremity dressing in place.  Bilateral lower extremity edema noted  Skin:    General: Skin is warm and dry.  Neurological:     Mental Status: He is alert and oriented to person, place, and time.        CMP Latest Ref Rng & Units 06/09/2020  Glucose 70 - 99 mg/dL 601(U)  BUN 6 - 20 mg/dL 15  Creatinine 9.32 - 3.55 mg/dL 7.32  Sodium 202 - 542 mmol/L 136  Potassium 3.5 - 5.1 mmol/L 3.6  Chloride 98 - 111 mmol/L 107  CO2 22 - 32 mmol/L 25  Calcium 8.9 - 10.3 mg/dL 7.6(L)  Total Protein 6.5 - 8.1 g/dL -  Total Bilirubin 0.3 - 1.2 mg/dL -    Alkaline Phos 38 - 126 U/L -  AST 15 - 41 U/L -  ALT 0 - 44 U/L -   CBC Latest Ref Rng & Units 06/10/2020  WBC 4.0 - 10.5 K/uL 7.4  Hemoglobin 13.0 - 17.0 g/dL 7.0(W)  Hematocrit 39 - 52 % 28.2(L)  Platelets 150 - 400 K/uL 99(L)    @IMAGES @  DG Chest 2 View  Result Date: 06/06/2020 CLINICAL DATA:  Bilateral leg pain EXAM: CHEST - 2 VIEW COMPARISON:  None. FINDINGS: The heart size and mediastinal contours are within normal limits. Both lungs are clear. The visualized skeletal structures are unremarkable. IMPRESSION: No active cardiopulmonary disease. Electronically Signed   By: 06/08/2020 M.D.   On: 06/06/2020 15:59   CT ANGIO CHEST PE W OR WO CONTRAST  Result Date: 06/07/2020 CLINICAL DATA:  Known bilateral DVT's.  Question pulmonary embolus. EXAM: CT ANGIOGRAPHY CHEST WITH CONTRAST TECHNIQUE: Multidetector CT imaging of the chest was performed using the standard protocol during bolus administration of intravenous contrast. Multiplanar CT image reconstructions and MIPs were obtained to evaluate the vascular anatomy. CONTRAST:  75 mL OMNIPAQUE IOHEXOL 350 MG/ML SOLN COMPARISON:  None. FINDINGS: Cardiovascular: Satisfactory opacification of the pulmonary arteries to the segmental level. No evidence of pulmonary embolism. Normal heart size. No pericardial effusion. Mediastinum/Nodes: No enlarged mediastinal, hilar, or axillary lymph nodes. Thyroid gland, trachea, and esophagus demonstrate no significant findings. Lungs/Pleura: No pleural effusion.  Mild dependent atelectasis. Upper Abdomen: Negative. Musculoskeletal: Negative. Review of the MIP images confirms the above findings. IMPRESSION: Negative for pulmonary embolus.  Negative chest CT. Electronically Signed   By: 06/09/2020 M.D.   On: 06/07/2020 14:51   MR ABDOMEN W WO CONTRAST  Result Date: 06/07/2020 CLINICAL DATA:  39 year old male with a history of abdominal mass, new lower extremity swelling EXAM: MRI ABDOMEN WITHOUT AND WITH  CONTRAST TECHNIQUE: Multiplanar multisequence MR imaging of the abdomen was performed both before and after the administration of intravenous contrast. CONTRAST:  75mL GADAVIST GADOBUTROL 1 MMOL/ML IV SOLN COMPARISON:  CT angiogram 06/04/2020, prior duplex 06/06/2020 FINDINGS: Lower chest: Unremarkable Hepatobiliary: Uniform signal  throughout the visualized liver. Small hypointense focus on the anti dependent gallbladder wall within the fundus, not visualized on prior CT. This measures 4 mm. No inflammatory changes. Pancreas:  Unremarkable Spleen:  Unremarkable Adrenals/Urinary Tract: - Right adrenal gland:  Unremarkable - Left adrenal gland: Unremarkable. - Right kidney: Advanced hydronephrosis/pelvicaliectasis of the right kidney, with complete atrophy of right renal parenchyma. Dilated and tortuous proximal right ureter which terminates at the adjacent to the right iliac vasculature/IVC, similar to prior CT. No inflammatory changes adjacent to the right collecting system. No focal lesion. - Left Kidney: No hydronephrosis, nephrolithiasis, inflammation, or ureteral dilation. No focal lesion. Stomach/Bowel: Visualized portions within the abdomen are unremarkable. Vascular: Unremarkable course caliber and contour of the visualized aorta. Unremarkable course caliber and contour of the visualized iliac arteries. Celiac artery, superior mesenteric artery, inferior mesenteric artery remain patent. Single left renal artery which remains patent. Small right renal artery. Portal vasculature unremarkable. Hepatic venous vasculature unremarkable. Loss of the normal flow signal of the bilateral external iliac veins, bilateral common iliac veins. The flow signal absence extends to the confluence of the bilateral iliac veins, or a small caliber IVC is identified. As was seen on the comparison CT, the infrarenal IVC is small caliber. IVC more normal diameter at the level of the left renal vein inflow, 22 mm. Loss of flow signal  into enlarged left lumbar collateral venous drainage, which was present on the prior CT. No significant enlarged superficial body wall collaterals. Other:  No adenopathy. Hypointense signal adjacent to the lower IVC and in the distribution of the lymphatics of the right common iliac pathway, corresponding to the multifocal calcium on the prior CT. Musculoskeletal: Unremarkable signal of the visualized skeletal elements. IMPRESSION: The MRI confirms complete bilateral common iliac vein and external iliac vein thrombosis/DVT. There is also at least partial thrombosis of the internal iliac veins as well as the primary collateral venous drainage along the left lumbar drainage pathway, in the setting of chronic infrarenal IVC stenosis/occlusion. 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 As was previously suggested on CT and the MRI preliminary, the leading differential diagnosis of the etiology is retroperitoneal fibrosis, either idiopathic or related to mycobacterial infection. Given the absence of focal soft tissue lesion, atypical carcinoid or other mesenchymal tumor is not favored. Complete atrophy of the right kidney parenchyma with advanced hydronephrosis/pelvicaliectasis and dilation of the proximal ureter secondary to ureteral occlusion. Signed, Yvone Neu. Reyne Dumas, RPVI [facility] Electronically Signed   By: Gilmer Mor D.O.   On: 06/07/2020 13:40   CT Angio Aortobifemoral W and/or Wo Contrast  Result Date: 06/04/2020 CLINICAL DATA:  Acute back pain, bilateral leg pain, mottled lower extremity EXAM: CT ANGIOGRAPHY OF ABDOMINAL AORTA WITH ILIOFEMORAL RUNOFF TECHNIQUE: Multidetector CT imaging of the abdomen, pelvis and lower extremities was performed using the standard protocol during bolus administration of intravenous contrast.  Multiplanar CT image reconstructions and MIPs were obtained to evaluate the vascular anatomy. CONTRAST:  OMNIPAQUE IOHEXOL 350 MG/ML SOLN COMPARISON:  None. FINDINGS: VASCULAR Aorta: Normal caliber aorta without aneurysm, dissection, vasculitis or significant stenosis. Celiac: Patent without evidence of aneurysm, dissection, vasculitis or significant stenosis. SMA: Patent without evidence of aneurysm, dissection, vasculitis or significant stenosis. Renals: Single left renal artery, unremarkable. Single right renal artery, diffusely diminutive in size. IMA: Patent without evidence of aneurysm, dissection, vasculitis or significant stenosis. RIGHT Lower Extremity  Inflow: Common, internal and external iliac arteries are patent without evidence of aneurysm, dissection, vasculitis or significant stenosis. Outflow: Common, superficial and profunda femoral arteries and the proximal popliteal artery are patent without evidence of aneurysm, dissection, vasculitis or significant stenosis. There is asymmetric SFA opacification, left greater than right. There is incomplete opacification of the distal right popliteal artery. Runoff: No significant contrast opacification. LEFT Lower Extremity Inflow: Common, internal and external iliac arteries are patent without evidence of aneurysm, dissection, vasculitis or significant stenosis. Outflow: Common, superficial and profunda femoral arteries and the popliteal artery are patent without evidence of aneurysm, dissection, vasculitis or significant stenosis. Runoff: Patent 3 vessel runoff to the mid calf, incomplete opacification distally presumably related to scan timing. Veins: Dilated right left common iliac vein and iliocaval confluence up to 3.2 cm diameter, with tapered narrowing of the IVC to 1.4 cm in its infrarenal segment. Due to arterial scan timing, no venous opacification to assess for thrombus. Review of the MIP images confirms the above findings. NON-VASCULAR Lower  chest: No pleural or pericardial effusion. Visualized lung bases clear. Hepatobiliary: No focal liver abnormality is seen. No gallstones, gallbladder wall thickening, or biliary dilatation. Pancreas: Unremarkable. No pancreatic ductal dilatation or surrounding inflammatory changes. Spleen: Normal in size.  Single small calcified granuloma. Adrenals/Urinary Tract: Adrenal glands unremarkable. Left kidney normal. There is massive right hydronephrosis and proximal ureterectasis with advanced atrophy of the renal parenchyma, only a thin rim of tissue evident. The right ureterectasis terminates at the level of a right retroperitoneal soft tissue mass containing coarse calcifications process abutting the right common iliac vein and distal IVC. Distal right ureter decompressed. Urinary bladder nondistended, mildly thick-walled. Thick-walled urachal remnant with soft tissue nodularity containing 2 foci of calcification. Stomach/Bowel: Stomach is partially distended by ingested fluid. The small bowel is nondistended. normal appendix. The colon is decompressed, unremarkable. Lymphatic: Multiple prominent subcentimeter right external iliac, bilateral common iliac, left para-aortic and aortocaval lymph nodes. No mesenteric adenopathy. Reproductive: Prostate is unremarkable. Other: No ascites.  No free air. Musculoskeletal: Soft tissue mass containing coarse calcifications extending over a craniocaudal length of 7.2 cm, abutting right common iliac vein and ilio caval confluence, in corresponding to transition point of proximal right ureterectasis. Left knee medial compartment DJD. No fracture or worrisome bone lesion. IMPRESSION: 1. No aortic dissection, occlusion or stenosis. 2. Asymmetric flow to lower extremities left greater than right, limiting evaluation of distal right popliteal artery and runoff such that acute embolus cannot be confidently excluded. 3. Dilated left common iliac vein and iliocaval confluence up to 3.2 cm  diameter, with tapered narrowing of the IVC to 1.4 cm in its infrarenal segment. Given the clinical history, bilateral lower extremity venous ultrasound may be useful for further evaluation. 4. Right retroperitoneal soft tissue mass containing coarse calcifications, abutting the right common iliac vein and distal IVC. 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 MR may be useful for better soft tissue characterization. 5. Thick-walled urachal remnant with soft tissue nodularity containing 2 foci of calcification. Recommend urologic consultation. Electronically Signed   By: Corlis Leak  Hassell M.D.   On: 06/04/2020 10:35   PERIPHERAL VASCULAR CATHETERIZATION  Result Date: 06/10/2020 See op note  PERIPHERAL VASCULAR CATHETERIZATION  Result Date: 06/08/2020 See op note  PERIPHERAL VASCULAR CATHETERIZATION  Result Date: 06/04/2020 See op note  US Venous Img Lower Bilateral (DVT)  Result Date: 06/06/2020 CLINICAL DATA:  Left calf tenderness for 4 days.  EXAM: BILATERAL LOWER EXTREMITY VENOUS DOPPLER ULTRASOUND TECHNIQUE: Gray-scale sonography with graded compression, as well as color Doppler and duplex ultrasound were performed to evaluate the lower extremity deep venous systems from the level of the common femoral vein and including the common femoral, femoral, profunda femoral, popliteal and calf veins including the posterior tibial, peroneal and gastrocnemius veins when visible. The superficial great saphenous vein was also interrogated. Spectral Doppler was utilized to evaluate flow at rest and with distal augmentation maneuvers in the common femoral, femoral and popliteal veins. COMPARISON:  None. FINDINGS: RIGHT LOWER EXTREMITY There is occlusive thrombus involving the right common femoral vein, saphenofemoral junction, profundus femoral vein, femoral vein throughout its course, popliteal vein, as well as  visualized calf veins. There is lack of normal compressibility and response to augmentation. LEFT LOWER EXTREMITY Similar to the right there is occlusive thrombus involving the right common femoral vein, saphenofemoral junction, profundus femoral vein, femoral vein throughout its course, popliteal vein, as well as visualized calf veins. There is lack of normal compressibility and response to augmentation. IMPRESSION: Bilateral occlusive deep vein thrombus extending from the common femoral veins through the visualized calf veins. These results will be called to the ordering clinician or representative by the Radiologist Assistant, and communication documented in the PACS or [employer]. Electronically Signed   By: Emmaline Kluver M.D.   On: 06/06/2020 14:21   DG C-Arm 1-60 Min-No Report  Result Date: 06/04/2020 Fluoroscopy was utilized by the requesting physician.  No radiographic interpretation.   ECHOCARDIOGRAM COMPLETE  Result Date: 06/05/2020    ECHOCARDIOGRAM REPORT   Patient Name:   Deetta Perla Date of Exam: 06/05/2020 Medical Rec #:  [id number]    Height:       71.0 in Accession #:    [id number]   Weight:       230.0 lb Date of Birth:  Nov 29, 1980    BSA:          2.238 m Patient Age:    39 years     BP:           134/95 mmHg Patient Gender: M            HR:           110 bpm. Exam Location:  [facility] Procedure: 2D Echo, Cardiac Doppler and Color Doppler Indications:     CHF- acute systolic 428.21  History:         Patient has no prior history of Echocardiogram examinations. No                  medical history on file.  Sonographer:     Cristela Blue RDCS (AE) Referring Phys:  [id number] Tonette Lederer Diagnosing Phys: Julien Nordmann MD IMPRESSIONS  1. Left ventricular ejection fraction, by estimation, is 60 to 65%. The left ventricle has normal function. The left ventricle has no regional wall motion abnormalities. There is mild left ventricular hypertrophy. Left ventricular diastolic parameters are  consistent with Grade I diastolic dysfunction (impaired relaxation).  2. Right ventricular systolic function is normal. The right ventricular size is normal. FINDINGS  Left Ventricle: Left ventricular ejection fraction, by estimation, is 60 to 65%. The left ventricle has normal function. The left ventricle has no regional wall motion abnormalities. The left ventricular internal cavity size was normal in size. There is  mild left ventricular hypertrophy. Left ventricular diastolic parameters are consistent with Grade I diastolic dysfunction (impaired relaxation). Right Ventricle: The right ventricular size is  normal. No increase in right ventricular wall thickness. Right ventricular systolic function is normal. Left Atrium: Left atrial size was normal in size. Right Atrium: Right atrial size was normal in size. Pericardium: There is no evidence of pericardial effusion. Mitral Valve: The mitral valve is normal in structure. Normal mobility of the mitral valve leaflets. No evidence of mitral valve regurgitation. No evidence of mitral valve stenosis. Tricuspid Valve: The tricuspid valve is normal in structure. Tricuspid valve regurgitation is not demonstrated. No evidence of tricuspid stenosis. Aortic Valve: The aortic valve is normal in structure. Aortic valve regurgitation is not visualized. No aortic stenosis is present. Aortic valve mean gradient measures 8.0 mmHg. Aortic valve peak gradient measures 14.4 mmHg. Aortic valve area, by VTI measures 2.40 cm. Pulmonic Valve: The pulmonic valve was normal in structure. Pulmonic valve regurgitation is not visualized. No evidence of pulmonic stenosis. Aorta: The aortic root is normal in size and structure. Venous: The inferior vena cava is normal in size with greater than 50% respiratory variability, suggesting right atrial pressure of 3 mmHg. IAS/Shunts: No atrial level shunt detected by color flow Doppler.  LEFT VENTRICLE PLAX 2D LVIDd:         3.21 cm  Diastology LVIDs:          2.02 cm  LV e' lateral:   8.49 cm/s LV PW:         1.31 cm  LV E/e' lateral: 6.1 LV IVS:        1.40 cm  LV e' medial:    8.16 cm/s LVOT diam:     2.00 cm  LV E/e' medial:  6.3 LV SV:         77 LV SV Index:   34 LVOT Area:     3.14 cm  RIGHT VENTRICLE RV S prime:     23.70 cm/s TAPSE (M-mode): 2.6 cm LEFT ATRIUM             Index       RIGHT ATRIUM           Index LA diam:        2.80 cm 1.25 cm/m  RA Area:     13.70 cm LA Vol (A2C):   34.2 ml 15.26 ml/m RA Volume:   29.60 ml  13.23 ml/m LA Vol (A4C):   53.2 ml 23.78 ml/m LA Biplane Vol: 47.0 ml 21.00 ml/m  AORTIC VALVE                    PULMONIC VALVE AV Area (Vmax):    2.14 cm     PV Vmax:        1.11 m/s AV Area (Vmean):   2.31 cm     PV Peak grad:   4.9 mmHg AV Area (VTI):     2.40 cm     RVOT Peak grad: 7 mmHg AV Vmax:           189.50 cm/s AV Vmean:          130.500 cm/s AV VTI:            0.320 m AV Peak Grad:      14.4 mmHg AV Mean Grad:      8.0 mmHg LVOT Vmax:         129.00 cm/s LVOT Vmean:        96.100 cm/s LVOT VTI:          0.245 m LVOT/AV VTI ratio: 0.76  AORTA Ao  Root diam: 3.10 cm MITRAL VALVE MV Area (PHT): 4.10 cm    SHUNTS MV Decel Time: 185 msec    Systemic VTI:  0.24 m MV E velocity: 51.40 cm/s  Systemic Diam: 2.00 cm MV A velocity: 82.00 cm/s MV E/A ratio:  0.63 Julien Nordmann MD Electronically signed by Julien Nordmann MD Signature Date/Time: 06/05/2020/6:38:49 PM    Final     Assessment and plan- Patient is a 39 y.o. 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  Differential diagnosis includes idiopathic retroperitoneal fibrosis versus mycobacterial infection versus possible malignancy.  However there is no soft tissue correlate seen on MRI and atypical carcinoid or other  mesenchymal tumor was not favored.  From an oncology standpoint currently there is no definitive diagnosis of malignancy.  Even if malignancy remains on the differential, this kind of extensive disease will be better treated at a tertiary center which treats retroperitoneal sarcomas. Retroperitoneal fibrosis and its treatment does not come under purview of oncology. It may be autoimmune mediated requiring steroids.  Since MRI findings are not definitive for a particular etiology patient may benefit from definitive diagnosis from a tissue biopsy requiring a laparoscopic approach.  Given his young age and relatively high risk profile given the bilateral iliac vein occlusion s/p stent placement and limb ischemia, he may benefit from transferring to a tertiary center given the rare diagnosis of retroperitoneal fibrosis to see if empiric treatment with steroids would be warranted versus pursuing tissue diagnosis.  With regards to bilateral iliac vein occlusion patient is already s/p stenting and thrombectomy and is currently on argatroban  I have explained all this to the patient and his wife in detail  Visit Diagnosis 1. Critical lower limb ischemia   2. Tenderness   3. Lung nodules   4. RPF (retroperitoneal fibrosis)     Dr. Owens Shark, MD, MPH [facility] [phone] 06/10/2020 9:30 PM

## 2020-06-10 NOTE — Op Note (Signed)
[facility]   OPERATIVE NOTE   PRE-OPERATIVE DIAGNOSIS: extensive bilateral lower extremity and IVC DVT, occluded IVC, status post overnight thrombolytic therapy  POST-OPERATIVE DIAGNOSIS: same   PROCEDURE: 1.   Catheter placement into IVC from bilateral popliteal approach 2.   IVC gram and bilateral lower extremity venogram 3.   Mechanical thrombectomy to bilateral common femoral veins, external and common iliac veins, and IVC with the penumbra CAT 12 device 4.   Kissing balloon stent placements to bilateral common iliac veins and inferior vena cava with 14 mm diameter by 8 cm length stents postdilated with a 12 mm balloons proximally and 14 mm balloon distally    SURGEON: Festus Barren, MD  ASSISTANT(S): none  ANESTHESIA: local with moderate conscious sedation for 60 minutes using 5 mg of Versed and 200 mcg of Fentanyl  ESTIMATED BLOOD LOSS: 200 cc  FINDING(S): 1.   Occluded distal IVC with residual thrombus status post overnight thrombolytic therapy in both common femoral veins and iliac veins as well as an occluded distal IVC.  SPECIMEN(S):  none  INDICATIONS:    Patient is a 39 y.o. male who presents with IVC occlusion and extensive bilateral lower extremity DVT.  Procedure was performed 2 days ago and he was run on continuous thrombolytic therapy for 24 hours.  Patient has marked leg swelling and pain.  Venous intervention is performed to reduce the symtpoms and avoid long term postphlebitic symptoms.    DESCRIPTION: After obtaining full informed written consent, the patient was brought back to the vascular suite and placed supine upon the table. Moderate conscious sedation was administered during a face to face encounter with the patient throughout the procedure with my supervision of the RN administering medicines and monitoring the patient's vital signs, pulse oximetry, telemetry and mental status throughout from the start of the procedure until the  patient was taken to the recovery room.  After obtaining adequate anesthesia, the patient was prepped and draped in the standard fashion.    The patient was then placed into the prone position.  The existing thrombolytic catheter was removed over a Magic torque wires, and Kumpe catheter was placed into the IVC and both iliac veins from both approaches were imaging showed extensive residual thrombosis throughout the common femoral veins, external and common iliac veins, and the IVC remained occluded despite continuous thrombolytic therapy. I used the Penumbra Cat 12 catheter and evacuated about 100 cc of effluent with mechanical thrombectomy throughout the iliac veins and common femoral veins first on the left side and then on the right side.  The catheter was then advanced into the distal IVC and was able to get through the occlusion and another 100 cc of effluent was returned from the IVC and proximal common iliac veins.  This had mild to moderate improvement but the IVC remained occluded distally with thrombus in the proximal portions of the common iliac veins just below.  I then elected to stent both common iliac veins up through the IVC and in the midportion essentially like a double barrel shotgun through the occlusion.  A 14 mm diameter by 8 cm length life star stent was selected on both sides and deployed into the mid infrarenal vena cava and down into the common iliac veins about 2 cm.  This was postdilated with a 12 mm balloon in the vena cava from each side essentially making a 24 mm lumen and then 14 mm balloons distally in the iliac veins.  Completion imaging  showed less than 20% residual stenosis through the vena cava and proximal iliac veins.  The small amount residual thrombus will be treated with anticoagulation.  I then elected to terminate the procedure.  The sheaths were removed and a dressing was placed.  She was taken to the recovery room in stable condition having tolerated the procedure well.     COMPLICATIONS: None  CONDITION: Stable  Festus Barren 06/10/2020 9:13 AM

## 2020-06-10 NOTE — Progress Notes (Signed)
ANTICOAGULATION CONSULT NOTE - Initial Consult  Pharmacy Consult for Argatroban Drip Indication: ischemic limbs due to bilateral occlusive DVTs  No Known Allergies  Patient Measurements: Height: 5\' 11"  (180.3 cm) Weight: 119.4 kg (263 lb 3.7 oz) IBW/kg (Calculated) : 75.3 Heparin Dosing Weight:    Vital Signs: Temp: 98.6 F (37 C) (07/21 0100) Temp Source: Oral (07/21 0100) BP: 134/87 (07/21 0300) Pulse Rate: 106 (07/21 0300)  Labs: Recent Labs    06/07/20 0439 06/07/20 0439 06/08/20 0317 06/08/20 1016 06/08/20 1931 06/08/20 1931 06/09/20 0121 06/09/20 0121 06/09/20 0732 06/09/20 1241 06/09/20 1752 06/09/20 2146 06/10/20 0143  HGB 11.5*   < > 10.9*   < > 10.1*   < > 10.2*   < > 9.6*  --   --   --  9.8*  HCT 34.3*   < > 32.1*   < > 29.7*   < > 29.0*  --  27.8*  --   --   --  28.2*  PLT 85*   < > 104*   < > 97*   < > 94*  --  91*  --   --   --  99*  APTT 87*   < > 92*   < >  --   --   --   --   --    < > 47* 44* 84*  LABPROT  --   --  23.3*  --   --   --   --   --   --   --   --   --   --   INR  --   --  2.2*  --   --   --   --   --   --   --   --   --   --   HEPARINUNFRC  --   --   --    < > <0.10*  --  <0.10*  --  <0.10*  --   --   --   --   CREATININE 1.11  --  1.22  --   --   --  1.18  --   --   --   --   --   --    < > = values in this interval not displayed.    Estimated Creatinine Clearance: 112.6 mL/min (by C-G formula based on SCr of 1.18 mg/dL).  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 Patient is s/p thrombectomy with overnight tPA infusion bilaterally. Fibrinogen level 73. The tPA has been stopped and patient to begin argatroban infusion. The patient had a decrease in platelets following Heparin infusion, HIT labs are pending.  7/20 12:41 aPTT 53, therapeutic x 1 7/20 14:58 aPTT 49, will increase 20% to 0.3 mcg/kg/min 7/20  1752 aPTT 47 7/20 2146 aPTT 44 7/21 0143 aPTT 84, therapeutic, CBC  stable  Goal of Therapy:  aPTT 50 - 90 seconds Monitor platelets by anticoagulation protocol: Yes   Plan:  7/21 @ 0143 aPTT 84. APTT is therapeutic. Will Continue argatroban at rate of 0.45 mcg/kg/min.  Will continue to check aPTT every 2 hours until stable. Daily CBC.  8/21, PharmD Clinical Pharmacist 06/10/2020 3:16 AM

## 2020-06-11 LAB — PROTEIN ELECTROPHORESIS, SERUM
A/G Ratio: 1 (ref 0.7–1.7)
Albumin ELP: 2.6 g/dL — ABNORMAL LOW (ref 2.9–4.4)
Alpha-1-Globulin: 0.5 g/dL — ABNORMAL HIGH (ref 0.0–0.4)
Alpha-2-Globulin: 0.7 g/dL (ref 0.4–1.0)
Beta Globulin: 1 g/dL (ref 0.7–1.3)
Gamma Globulin: 0.6 g/dL (ref 0.4–1.8)
Globulin, Total: 2.7 g/dL (ref 2.2–3.9)
Total Protein ELP: 5.3 g/dL — ABNORMAL LOW (ref 6.0–8.5)

## 2020-06-11 LAB — BASIC METABOLIC PANEL
Anion gap: 7 (ref 5–15)
BUN: 10 mg/dL (ref 6–20)
CO2: 25 mmol/L (ref 22–32)
Calcium: 7.7 mg/dL — ABNORMAL LOW (ref 8.9–10.3)
Chloride: 107 mmol/L (ref 98–111)
Creatinine, Ser: 1.12 mg/dL (ref 0.61–1.24)
GFR calc Af Amer: >60 mL/min (ref >60)
GFR calc non Af Amer: >60 mL/min (ref >60)
Glucose, Bld: 104 mg/dL — ABNORMAL HIGH (ref 70–99)
Potassium: 3.8 mmol/L (ref 3.5–5.1)
Sodium: 139 mmol/L (ref 135–145)

## 2020-06-11 LAB — CBC
HCT: 27.4 % — ABNORMAL LOW (ref 39.0–52.0)
Hemoglobin: 9.2 g/dL — ABNORMAL LOW (ref 13.0–17.0)
MCH: 29.6 pg (ref 26.0–34.0)
MCHC: 33.6 g/dL (ref 30.0–36.0)
MCV: 88.1 fL (ref 80.0–100.0)
Platelets: 128 10*3/uL — ABNORMAL LOW (ref 150–400)
RBC: 3.11 MIL/uL — ABNORMAL LOW (ref 4.22–5.81)
RDW: 11.6 % (ref 11.5–15.5)
WBC: 7.4 10*3/uL (ref 4.0–10.5)
nRBC: 0 % (ref 0.0–0.2)

## 2020-06-11 LAB — HEPARIN LEVEL (UNFRACTIONATED): Heparin Unfractionated: 0.21 IU/mL — ABNORMAL LOW (ref 0.30–0.70)

## 2020-06-11 LAB — MAGNESIUM: Magnesium: 2.1 mg/dL (ref 1.7–2.4)

## 2020-06-11 LAB — GLUCOSE, CAPILLARY
Glucose-Capillary: 108 mg/dL — ABNORMAL HIGH (ref 70–99)
Glucose-Capillary: 97 mg/dL (ref 70–99)
Glucose-Capillary: 87 mg/dL (ref 70–99)
Glucose-Capillary: 100 mg/dL — ABNORMAL HIGH (ref 70–99)
Glucose-Capillary: 91 mg/dL (ref 70–99)
Glucose-Capillary: 113 mg/dL — ABNORMAL HIGH (ref 70–99)

## 2020-06-11 LAB — HISTOPLASMA GAL'MANNAN AG SER: Histoplasma Gal'mannan Ag Ser: <0.5 (ref <0.5)

## 2020-06-11 LAB — HEPARIN INDUCED PLATELET AB (HIT ANTIBODY): Heparin Induced Plt Ab: 0.094 OD (ref 0.000–0.400)

## 2020-06-11 LAB — APTT: aPTT: 78 seconds — ABNORMAL HIGH (ref 24–36)

## 2020-06-11 MED ORDER — HEPARIN (PORCINE) 25000 UT/250ML-% IV SOLN
1700.0000 [IU]/h | INTRAVENOUS | Status: DC
  Administered 2020-06-11: 1600 [IU]/h via INTRAVENOUS
  Administered 2020-06-12 – 2020-06-16 (×7): 1850 [IU]/h via INTRAVENOUS
  Filled 2020-06-11 (×9): qty 250

## 2020-06-11 NOTE — Consult Note (Signed)
[facility]  CONSULT NOTE    Date: 06/11/2020                  Patient Name:  Austin Glover  MRN: [id number]  DOB: 06-Mar-1981  Age / Sex: 39 y.o., male         PCP: Patient, No Pcp Per                 Service Requesting Consult: Dr. Mal MistyNetty                 Reason for Consult: Acute renal failure            History of Present Illness: Mr. Austin Glover was [facility] on 7/15 with occlusion of bilateral popliteal arteries. Patient underwent angiogram by Dr. Wyn Quakerew. Found to have acute renal failure. Found to have right sided hydronephrosis consistent with retroperitoneal fibrosis. Then on 7/19 and 7/21, underwent bilateral thrombectomy by Dr. Wyn Quakerew.   Nephrology consulted. Patient states he is doing well this morning and has no complaints. Patient states his pain is well controlled and he is making urine.    Medications: Outpatient medications: No medications prior to admission.    Current medications: Current Facility-Administered Medications  Medication Dose Route Frequency Provider Last Rate Last Admin  . acetaminophen (TYLENOL) tablet 650 mg  650 mg Oral Q6H PRN Annice Needyew, Jason S, MD   650 mg at 06/08/20 0452  . Chlorhexidine Gluconate Cloth 2 % PADS 6 each  6 each Topical Daily Annice Needyew, Jason S, MD   6 each at 06/11/20 1515  . docusate sodium (COLACE) capsule 100 mg  100 mg Oral BID Annice Needyew, Jason S, MD   100 mg at 06/11/20 1026  . heparin ADULT infusion 100 units/mL (25000 units/23150mL sodium chloride 0.45%)  1,600 Units/hr Intravenous Continuous Narda BondsNettey, Ralph A, MD 16 mL/hr at 06/11/20 1402 1,600 Units/hr at 06/11/20 1402  . morphine 2 MG/ML injection 2-4 mg  2-4 mg Intravenous Q3H PRN Narda BondsNettey, Ralph A, MD   2 mg at 06/10/20 2007  . ondansetron (ZOFRAN) injection 4 mg  4 mg Intravenous Q6H PRN Annice Needyew, Jason S, MD   4 mg at 06/04/20 1608  . oxyCODONE (Oxy IR/ROXICODONE) immediate release tablet 5-10 mg  5-10 mg Oral Q4H PRN Narda BondsNettey, Ralph A, MD   10 mg at 06/11/20 1514  .  pantoprazole (PROTONIX) EC tablet 40 mg  40 mg Oral Q0600 Annice Needyew, Jason S, MD   40 mg at 06/11/20 [id number]  . simethicone (MYLICON) chewable tablet 80 mg  80 mg Oral QID PRN Annice Needyew, Jason S, MD   80 mg at 06/07/20 1800      Allergies: No Known Allergies    Past Medical History: History reviewed. No pertinent past medical history.   Past Surgical History: Past Surgical History:  Procedure Laterality Date  . LOWER EXTREMITY ANGIOGRAPHY Right 06/04/2020   Procedure: Lower Extremity Angiography;  Surgeon: Annice Needyew, Jason S, MD;  Location: [facility] INVASIVE CV LAB;  Service: Cardiovascular;  Laterality: Right;  . PERIPHERAL VASCULAR THROMBECTOMY Bilateral 06/08/2020   Procedure: PERIPHERAL VASCULAR THROMBECTOMY, BLE and IVC;  Surgeon: Annice Needyew, Jason S, MD;  Location: [facility] INVASIVE CV LAB;  Service: Cardiovascular;  Laterality: Bilateral;  . PERIPHERAL VASCULAR THROMBECTOMY Bilateral 06/10/2020   Procedure: PERIPHERAL VASCULAR THROMBECTOMY;  Surgeon: Annice Needyew, Jason S, MD;  Location: [facility] INVASIVE CV LAB;  Service: Cardiovascular;  Laterality: Bilateral;  . TONSILLECTOMY       Family History: Family History  Problem Relation  Age of Onset  . Diabetes Mellitus II Father   . Kidney Stones Father      Social History: Social History   Socioeconomic History  . Marital status: Married    Spouse name: Not on file  . Number of children: Not on file  . Years of education: Not on file  . Highest education level: Not on file  Occupational History  . Not on file  Tobacco Use  . Smoking status: Never Smoker  . Smokeless tobacco: Never Used  Vaping Use  . Vaping Use: Never used  Substance and Sexual Activity  . Alcohol use: Yes    Comment: occasional  . Drug use: Never  . Sexual activity: Yes  Other Topics Concern  . Not on file  Social History Narrative  . Not on file   Social Determinants of Health   Financial Resource Strain:   . Difficulty of Paying Living Expenses:   Food Insecurity:   . Worried  About Programme researcher, broadcasting/film/video in the Last Year:   . Barista in the Last Year:   Transportation Needs:   . Freight forwarder (Medical):   Marland Kitchen Lack of Transportation (Non-Medical):   Physical Activity:   . Days of Exercise per Week:   . Minutes of Exercise per Session:   Stress:   . Feeling of Stress :   Social Connections:   . Frequency of Communication with Friends and Family:   . Frequency of Social Gatherings with Friends and Family:   . Attends Religious Services:   . Active Member of Clubs or Organizations:   . Attends Banker Meetings:   Marland Kitchen Marital Status:   Intimate Partner Violence:   . Fear of Current or Ex-Partner:   . Emotionally Abused:   Marland Kitchen Physically Abused:   . Sexually Abused:      Review of Systems: Review of Systems  Constitutional: Negative.   HENT: Negative.   Eyes: Negative.   Respiratory: Negative.  Negative for cough, hemoptysis, sputum production, shortness of breath and wheezing.   Cardiovascular: Negative.  Negative for chest pain, palpitations, orthopnea, claudication, leg swelling and PND.  Gastrointestinal: Negative.   Genitourinary: Negative.  Negative for dysuria, flank pain, frequency, hematuria and urgency.  Musculoskeletal: Positive for myalgias. Negative for back pain, falls, joint pain and neck pain.  Skin: Negative.   Neurological: Negative.   Endo/Heme/Allergies: Negative.   Psychiatric/Behavioral: Negative.     Vital Signs: Blood pressure 114/82, pulse 99, temperature 99 F (37.2 C), temperature source Oral, resp. rate (!) 25, height 5\' 11"  (1.803 m), weight 114 kg, SpO2 96 %.  Weight trends: Filed Weights   06/10/20 0313 06/10/20 0720 06/11/20 0500  Weight: 119.4 kg 119.4 kg 114 kg    Physical Exam: General: NAD,   Head: Normocephalic, atraumatic. Moist oral mucosal membranes  Eyes: Anicteric, PERRL  Neck: Supple, trachea midline  Lungs:  Clear to auscultation  Heart: Regular rate and rhythm  Abdomen:   Soft, nontender,   Extremities:  trace peripheral edema.  Neurologic: Nonfocal, moving all four extremities  Skin: No lesions         Lab results: Basic Metabolic Panel: Recent Labs  Lab 06/05/20 0521 06/05/20 2339 06/06/20 0351 06/07/20 0439 06/08/20 0317 06/09/20 0121 06/11/20 0513  NA 137   < > 132*   < > 135 136 139  K 4.7   < > 4.0   < > 3.7 3.6 3.8  CL 109   < >  104   < > 106 107 107  CO2 19*   < > 22   < > 25 25 25   GLUCOSE 131*   < > 120*   < > 100* 106* 104*  BUN 21*   < > 17   < > 12 15 10   CREATININE 1.57*   < > 1.45*   < > 1.22 1.18 1.12  CALCIUM 8.4*   < > 8.5*   < > 7.9* 7.6* 7.7*  MG 1.6*  --  2.1  --   --   --  2.1   < > = values in this interval not displayed.    Liver Function Tests: Recent Labs  Lab 06/06/20 0351  AST 32  ALT 24  ALKPHOS 61  BILITOT 1.1  PROT 6.3*  ALBUMIN 3.4*   No results for input(s): LIPASE, AMYLASE in the last 168 hours. No results for input(s): AMMONIA in the last 168 hours.  CBC: Recent Labs  Lab 06/06/20 1311 06/07/20 0439 06/08/20 1931 06/08/20 1931 06/09/20 0121 06/09/20 0121 06/09/20 0732 06/10/20 0143 06/11/20 0513  WBC 9.3   < > 10.3   < > 7.6   < > 7.1 7.4 7.4  NEUTROABS 6.6  --   --   --   --   --   --   --   --   HGB 12.4*   < > 10.1*   < > 10.2*   < > 9.6* 9.8* 9.2*  HCT 35.2*   < > 29.7*   < > 29.0*   < > 27.8* 28.2* 27.4*  MCV 85.6   < > 88.4  --  87.1  --  88.3 87.3 88.1  PLT 84*   < > 97*   < > 94*   < > 91* 99* 128*   < > = values in this interval not displayed.    Cardiac Enzymes: Recent Labs  Lab 06/06/20 0351  CKTOTAL 801*    BNP: Invalid input(s): POCBNP  CBG: Recent Labs  Lab 06/10/20 1954 06/10/20 2351 06/11/20 0353 06/11/20 0806 06/11/20 1209  GLUCAP 137* 108* 97 87 100*    Microbiology: Results for orders placed or performed during the hospital encounter of 06/04/20  Blood culture (routine x 2)     Status: None   Collection Time: 06/04/20  9:50 AM   Specimen:  BLOOD  Result Value Ref Range Status   Specimen Description BLOOD LEFT [facility]  Final   Special Requests   Final    BOTTLES DRAWN AEROBIC AND ANAEROBIC Blood Culture adequate volume   Culture   Final    NO GROWTH 5 DAYS Performed at [facility], [address] [place], [address] [place]    Report Status 06/09/2020 FINAL  Final  Blood culture (routine x 2)     Status: None   Collection Time: 06/04/20  9:50 AM   Specimen: BLOOD  Result Value Ref Range Status   Specimen Description BLOOD RIGHT HAND  Final   Special Requests   Final    BOTTLES DRAWN AEROBIC AND ANAEROBIC Blood Culture adequate volume   Culture   Final    NO GROWTH 5 DAYS Performed at [facility], [address], [place], [address] [place]    Report Status 06/09/2020 FINAL  Final  SARS Coronavirus 2 by RT PCR (hospital order, performed in [facility] hospital lab) Nasopharyngeal Nasopharyngeal Swab     Status: None   Collection Time: 06/04/20 10:18 AM  Specimen: Nasopharyngeal Swab  Result Value Ref Range Status   SARS Coronavirus 2 NEGATIVE NEGATIVE Final    Comment: (NOTE) SARS-CoV-2 target nucleic acids are NOT DETECTED.  The SARS-CoV-2 RNA is generally detectable in upper and lower respiratory specimens during the acute phase of infection. The lowest concentration of SARS-CoV-2 viral copies this assay can detect is 250 copies / mL. A negative result does not preclude SARS-CoV-2 infection and should not be used as the sole basis for treatment or other patient management decisions.  A negative result may occur with improper specimen collection / handling, submission of specimen other than nasopharyngeal swab, presence of viral mutation(s) within the areas targeted by this assay, and inadequate number of viral copies (<250 copies / mL). A negative result must be combined with clinical observations, patient history, and epidemiological information.  Fact Sheet for Patients:    BoilerBrush.com.cy  Fact Sheet for Healthcare Providers: https://pope.com/  This test is not yet approved or  cleared by the [place] FDA and has been authorized for detection and/or diagnosis of SARS-CoV-2 by FDA under an Emergency Use Authorization (EUA).  This EUA will remain in effect (meaning this test can be used) for the duration of the COVID-19 declaration under Section 564(b)(1) of the Act, 21 U.S.C. section 360bbb-3(b)(1), unless the authorization is terminated or revoked sooner.  Performed at [facility], [address] [place], [place] [address]   MRSA PCR Screening     Status: None   Collection Time: 06/08/20  3:25 PM   Specimen: Nasopharyngeal  Result Value Ref Range Status   MRSA by PCR NEGATIVE NEGATIVE Final    Comment:        The GeneXpert MRSA Assay (FDA approved for NASAL specimens only), is one component of a comprehensive MRSA colonization surveillance program. It is not intended to diagnose MRSA infection nor to guide or monitor treatment for MRSA infections. Performed at [facility], [address] [place], [place] [address]     Coagulation Studies: No results for input(s): LABPROT, INR in the last 72 hours.  Urinalysis: No results for input(s): COLORURINE, LABSPEC, PHURINE, GLUCOSEU, HGBUR, BILIRUBINUR, KETONESUR, PROTEINUR, UROBILINOGEN, NITRITE, LEUKOCYTESUR in the last 72 hours.  Invalid input(s): APPERANCEUR    Imaging: PERIPHERAL VASCULAR CATHETERIZATION  Result Date: 06/10/2020 See op note     Assessment & Plan: Mr. Raidyn Wassink is a 39 y.o. Hispanic male with no significant past medical history, who was admitted to [facility] on 06/04/2020 for Critical lower limb ischemia [I99.8] Ischemia of extremity [I99.8] Leg pain [M79.606]  Acute renal failure on solitary kidney: status post IV contrast exposure. Right hydronephrosis seems chronic. Secondary to  retroperitoneal fibrosis which will require further work up. Denies history of biologics or dopaminergic agents.  Renal function and urinalysis within normal limits.  Please call with questions.  LOS: 6 Alexander Aument 7/22/20213:17 PM

## 2020-06-11 NOTE — Progress Notes (Signed)
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  No Known Allergies  Patient Measurements: Height: 5\' 11"  (180.3 cm) Weight: 114 kg (251 lb 5.2 oz) IBW/kg (Calculated) : 75.3 Heparin Dosing Weight:    Vital Signs: Temp: 99.3 F (37.4 C) (07/22 0800) Temp Source: Oral (07/22 0800) BP: 124/92 (07/22 1000) Pulse Rate: 111 (07/22 1000)  Labs: Recent Labs    06/08/20 1931 06/08/20 1931 06/09/20 0121 06/09/20 0121 06/09/20 0732 06/09/20 1241 06/10/20 0143 06/10/20 0411 06/10/20 1302 06/10/20 1459 06/11/20 0513  HGB 10.1*   < > 10.2*   < > 9.6*  --  9.8*  --   --   --  9.2*  HCT 29.7*   < > 29.0*   < > 27.8*  --  28.2*  --   --   --  27.4*  PLT 97*   < > 94*   < > 91*  --  99*  --   --   --  128*  APTT  --   --   --   --   --    < > 84*   < > 67* 77* 78*  HEPARINUNFRC <0.10*  --  <0.10*  --  <0.10*  --   --   --   --   --   --   CREATININE  --   --  1.18  --   --   --   --   --   --   --  1.12   < > = values in this interval not displayed.    Estimated Creatinine Clearance: 116 mL/min (by C-G formula based on SCr of 1.12 mg/dL).  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 Patient is s/p thrombectomy. The patient had a decrease in platelets following Heparin infusion, HIT ordered. Patient was switched to argatroban. HIT negative 7/17. Patient to switch back to heparin infusion.    Goal of Therapy:  HL 0.3 - 0.7 Monitor platelets by anticoagulation protocol: Yes   Plan:  Argatroban stopped approximately 0830. Order for heparin 1600 units/hr to start ~ 1000. Omit bolus given argatroban straight to heparin. HL at 1700. CBC daily while on heparin drip.  8/17, PharmD Clinical Pharmacist 06/11/2020 10:44 AM

## 2020-06-11 NOTE — Progress Notes (Signed)
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  No Known Allergies  Patient Measurements: Height: 5\' 11"  (180.3 cm) Weight: 114 kg (251 lb 5.2 oz) IBW/kg (Calculated) : 75.3    Vital Signs: Temp: 99 F (37.2 C) (07/22 1400) Temp Source: Oral (07/22 1400) BP: 125/89 (07/22 1400) Pulse Rate: 102 (07/22 1400)  Labs: Recent Labs    06/08/20 1931 06/08/20 1931 06/09/20 0121 06/09/20 0121 06/09/20 0732 06/09/20 1241 06/10/20 0143 06/10/20 0411 06/10/20 1302 06/10/20 1459 06/11/20 0513  HGB 10.1*   < > 10.2*   < > 9.6*  --  9.8*  --   --   --  9.2*  HCT 29.7*   < > 29.0*   < > 27.8*  --  28.2*  --   --   --  27.4*  PLT 97*   < > 94*   < > 91*  --  99*  --   --   --  128*  APTT  --   --   --   --   --    < > 84*   < > 67* 77* 78*  HEPARINUNFRC <0.10*  --  <0.10*  --  <0.10*  --   --   --   --   --   --   CREATININE  --   --  1.18  --   --   --   --   --   --   --  1.12   < > = values in this interval not displayed.    Estimated Creatinine Clearance: 116 mL/min (by C-G formula based on SCr of 1.12 mg/dL).  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 Patient is s/p thrombectomy. The patient had a decrease in platelets following Heparin infusion, HIT ordered. Patient was switched to argatroban. HIT negative 7/17. Patient to switch back to heparin infusion. Argatroban stopped approximately 0830 07/22, heparin infusion was started at approximately 1030am 07/22  Goal of Therapy:  HL 0.3 - 0.7  Monitor platelets by anticoagulation protocol: Yes   Plan:   Anti-Xa subtherapeutic: increase heparin infusion rate to 1850 units/hr  Re-check anti-Xa level 6 hours after rate change  CBC daily while on heparin drip: next am 7/23  8/23, PharmD Clinical Pharmacist 06/11/2020 2:44 PM

## 2020-06-11 NOTE — Progress Notes (Signed)
[facility]  Daily Progress Note   Subjective  -   Feeling much better today.  Still some abdominal and back pain but improved  Objective Vitals:   06/11/20 0400 06/11/20 0500 06/11/20 0600 06/11/20 0800  BP: 124/83 127/84 109/72 130/82  Pulse: 97 (!) 108 92 100  Resp: (!) 29 (!) 28 (!) 0 17  Temp:    99.3 F (37.4 C)  TempSrc:    Oral  SpO2: 92% 93% 92% 99%  Weight:  114 kg    Height:        Intake/Output Summary (Last 24 hours) at 06/11/2020 0848 Last data filed at 06/11/2020 0800 Gross per 24 hour  Intake 1445.24 ml  Output 9225 ml  Net -7779.76 ml    PULM  CTAB CV  RRR VASC  moderate back bruising.  Lower extremity edema is down to 1+.  Laboratory CBC    Component Value Date/Time   WBC 7.4 06/11/2020 0513   HGB 9.2 (L) 06/11/2020 0513   HCT 27.4 (L) 06/11/2020 0513   PLT 128 (L) 06/11/2020 0513    BMET    Component Value Date/Time   NA 139 06/11/2020 0513   K 3.8 06/11/2020 0513   CL 107 06/11/2020 0513   CO2 25 06/11/2020 0513   GLUCOSE 104 (H) 06/11/2020 0513   BUN 10 06/11/2020 0513   CREATININE 1.12 06/11/2020 0513   CALCIUM 7.7 (L) 06/11/2020 0513   GFRNONAA >60 06/11/2020 0513   GFRAA >60 06/11/2020 6222    Assessment/Planning: POD #1 s/p IVC stenting, thrombectomy   Legs are far less swollen.  Overall seems to be doing fairly well.  Heparin antibody came back negative.  Would be much easier to titrate and regulate heparin and not argatroban, particularly with upcoming procedures.  Back bruising is not surprising given extensive thrombectomy and angioplasty and stenting required in the iliac vessels and IVC.  General surgery to see for consideration for biopsy for retroperitoneal fibrosis, but transfer to a tertiary center is also being considered.  This is certainly reasonable given the complexity of this case.  We will follow along but at this point no further vascular surgery or intervention is planned.  If he  has recurrent thrombosis, reintervention could certainly be considered    Festus Barren  06/11/2020, 8:48 AM

## 2020-06-11 NOTE — Progress Notes (Signed)
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  No Known Allergies  Patient Measurements: Height: 5\' 11"  (180.3 cm) Weight: 114 kg (251 lb 5.2 oz) IBW/kg (Calculated) : 75.3 Heparin Dosing Weight:    Vital Signs: Temp: 98.8 F (37.1 C) (07/22 0200) Temp Source: Oral (07/22 0200) BP: 127/84 (07/22 0500) Pulse Rate: 108 (07/22 0500)  Labs: Recent Labs    06/08/20 1931 06/08/20 1931 06/09/20 0121 06/09/20 0121 06/09/20 0732 06/09/20 1241 06/10/20 0143 06/10/20 0411 06/10/20 1302 06/10/20 1459 06/11/20 0513  HGB 10.1*   < > 10.2*   < > 9.6*  --  9.8*  --   --   --  9.2*  HCT 29.7*   < > 29.0*   < > 27.8*  --  28.2*  --   --   --  27.4*  PLT 97*   < > 94*   < > 91*  --  99*  --   --   --  128*  APTT  --   --   --   --   --    < > 84*   < > 67* 77* 78*  HEPARINUNFRC <0.10*  --  <0.10*  --  <0.10*  --   --   --   --   --   --   CREATININE  --   --  1.18  --   --   --   --   --   --   --  1.12   < > = values in this interval not displayed.    Estimated Creatinine Clearance: 116 mL/min (by C-G formula based on SCr of 1.12 mg/dL).  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 Patient is s/p thrombectomy with overnight tPA infusion bilaterally. Fibrinogen level 73. The tPA has been stopped and patient to begin argatroban infusion. The patient had a decrease in platelets following Heparin infusion, HIT labs are pending.  7/20 12:41 aPTT 53, therapeutic x 1 7/20 14:58 aPTT 49, will increase 20% to 0.3 mcg/kg/min 7/20  1752 aPTT 47 7/20 2146 aPTT 44 7/21 0143 aPTT 84, therapeutic, CBC stable 7/21 0411 aPTT 84, therapeutic x 2 7/21 1302 aPTT 67, therapeutic 7/21 1459 aPTT 77, therapeutic x 2 7/22 0513 aPTT 78, therapeutic, CBC stable.  Goal of Therapy:  aPTT 50 - 90 seconds Monitor platelets by anticoagulation protocol: Yes   Plan:  7/22 0513  aPTT 78, therapeutic. Continue argatroban at 0.45 mcg/kg/min. aPTT and CBC with morning labs.  8/22, PharmD Clinical Pharmacist 06/11/2020 5:47 AM

## 2020-06-11 NOTE — Progress Notes (Signed)
Called [facility] to request transfer; denied secondary to patient's lack of insurance  Called [facility] to request transfer; denied secondary to lack of bed availability. No waiting list for intermediate unit patients.  Will attempt repeat request transfer on 7/23 with anticipation that patient will be transferred to a medical floor at that time.  Jacquelin Hawking, MD [facility] 06/11/2020, 5:44 PM

## 2020-06-11 NOTE — Consult Note (Addendum)
[facility] SURGICAL CONSULTATION NOTE (initial) - cpt: 36644   HISTORY OF PRESENT ILLNESS (HPI):  39 y.o. male presented initially to [facility] ED 07/15 for evaluation of back pain. Patient had reported around a 48 hour of back pain and lower extremity pain and swelling. He was also reporting decreased levels of urination and "tea" colored urine. Initial examination in the ED was concerning for lack of palpable distal pulses in his bilateral lower extremities and dopplers were absent as well. CTA was concerning for bilateral popliteal artery occlusion. Vascular surgery was emergently consulted an he underwent angiography emergent on 07/15. He was admitted to the medicine service. He continued to have peripheral edema and was subsequently found to have bilateral occlusive DVT from the common femorals to the calves. 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 He underwent further vascular intervention of his bilateral DVTs on 07/19 with Dr Wyn Quaker.   Surgery is consulted by hospitalist physician Dr. Jodean Lima, MD in this context for evaluation and management of retroperitoneal fibrosis, suspected based upon imaging. Patient has been worked up for Tb with negative Quantiferon-Gold. No signs or symptoms of malignancy (ex: fever, night sweats, weight loss, lymphadenopathy). IGG4 negative.  Fungal testing still pending. ANA positive.    PAST MEDICAL HISTORY (PMH):  History reviewed. No pertinent past medical history.   PAST SURGICAL HISTORY [facility]):  Past Surgical History:  Procedure Laterality Date   LOWER EXTREMITY ANGIOGRAPHY Right 06/04/2020   Procedure: Lower Extremity Angiography;  Surgeon: Annice Needy, MD;  Location: [facility] INVASIVE CV LAB;  Service: Cardiovascular;  Laterality: Right;   PERIPHERAL VASCULAR THROMBECTOMY Bilateral 06/08/2020    Procedure: PERIPHERAL VASCULAR THROMBECTOMY, BLE and IVC;  Surgeon: Annice Needy, MD;  Location: [facility] INVASIVE CV LAB;  Service: Cardiovascular;  Laterality: Bilateral;   PERIPHERAL VASCULAR THROMBECTOMY Bilateral 06/10/2020   Procedure: PERIPHERAL VASCULAR THROMBECTOMY;  Surgeon: Annice Needy, MD;  Location: [facility] INVASIVE CV LAB;  Service: Cardiovascular;  Laterality: Bilateral;   TONSILLECTOMY       MEDICATIONS:  Prior to Admission medications   Not on File     ALLERGIES:  No Known Allergies   SOCIAL HISTORY:  Social History   Socioeconomic History   Marital status: Married    Spouse name: Not on file   Number of children: Not on file   Years of education: Not on file   Highest education level: Not on file  Occupational History   Not on file  Tobacco Use   Smoking status: Never Smoker   Smokeless tobacco: Never Used  Vaping Use   Vaping Use: Never used  Substance and Sexual Activity   Alcohol use: Yes    Comment: occasional   Drug use: Never   Sexual activity: Yes  Other Topics Concern   Not on file  Social History Narrative   Not on file   Social Determinants of Health   Financial Resource Strain:    Difficulty of Paying Living Expenses:   Food Insecurity:    Worried About Programme researcher, broadcasting/film/video in the Last Year:    Barista in the Last Year:   Transportation Needs:    Freight forwarder (Medical):    Lack of Transportation (Non-Medical):   Physical Activity:    Days of Exercise per Week:    Minutes of Exercise per Session:   Stress:  Feeling of Stress :   Social Connections:    Frequency of Communication with Friends and Family:    Frequency of Social Gatherings with Friends and Family:    Attends Religious Services:    Active Member of Clubs or Organizations:    Attends Engineer, structural:    Marital Status:   Intimate Partner Violence:    Fear of Current or Ex-Partner:    Emotionally Abused:    Physically Abused:     Sexually Abused:      FAMILY HISTORY:  Family History  Problem Relation Age of Onset   Diabetes Mellitus II Father    Kidney Stones Father       REVIEW OF SYSTEMS:  Review of Systems  Constitutional: Negative for chills, diaphoresis, fever and weight loss.  HENT: Negative for congestion and sore throat.   Respiratory: Negative for cough and shortness of breath.   Cardiovascular: Negative for chest pain and palpitations.  Gastrointestinal: Negative for abdominal pain, nausea and vomiting.  Genitourinary: Negative for dysuria and urgency.  Musculoskeletal: Positive for back pain.       + leg swelling  Neurological: Negative for dizziness and headaches.  All other systems reviewed and are negative.   VITAL SIGNS:  Temp:  [98.8 F (37.1 C)-98.9 F (37.2 C)] 98.8 F (37.1 C) (07/22 0200) Pulse Rate:  [91-112] 92 (07/22 0600) Resp:  [0-30] 0 (07/22 0600) BP: (109-156)/(72-96) 109/72 (07/22 0600) SpO2:  [90 %-100 %] 92 % (07/22 0600) Weight:  [664 kg] 114 kg (07/22 0500)     Height: 5\' 11"  (180.3 cm) Weight: 114 kg BMI (Calculated): 35.07   INTAKE/OUTPUT:  07/21 0701 - 07/22 0700 In: 1245.1 [I.V.:1245.1] Out: 8600 [Urine:8600]  PHYSICAL EXAM:  Physical Exam Vitals and nursing note reviewed. Exam conducted with a chaperone present.  Constitutional:      General: He is not in acute distress.    Appearance: Normal appearance. He is obese. He is not ill-appearing.  HENT:     Head: Normocephalic and atraumatic.     Mouth/Throat:     Mouth: Mucous membranes are moist.     Pharynx: Oropharynx is clear.  Eyes:     General: No scleral icterus.    Conjunctiva/sclera: Conjunctivae normal.     Pupils: Pupils are equal, round, and reactive to light.  Cardiovascular:     Rate and Rhythm: Regular rhythm. Tachycardia present.     Pulses: Normal pulses.     Heart sounds: No murmur heard.   Pulmonary:     Effort: Pulmonary effort is normal. No respiratory distress.  Abdominal:      General: Abdomen is flat. There is no distension.     Palpations: Abdomen is soft.     Tenderness: There is no abdominal tenderness. There is no guarding or rebound.  Genitourinary:    Comments: Deferred Musculoskeletal:     Right lower leg: Edema present.     Left lower leg: Edema present.  Skin:    General: Skin is warm and dry.     Coloration: Skin is not pale.     Findings: No erythema.  Neurological:     General: No focal deficit present.     Mental Status: He is alert and oriented to person, place, and time.  Psychiatric:        Mood and Affect: Mood normal.        Behavior: Behavior normal.      Labs:  CBC Latest Ref Rng & Units  06/11/2020 06/10/2020 06/09/2020  WBC 4.0 - 10.5 K/uL 7.4 7.4 7.1  Hemoglobin 13.0 - 17.0 g/dL 1.6(X) 0.9(U) 0.4(V)  Hematocrit 39 - 52 % 27.4(L) 28.2(L) 27.8(L)  Platelets 150 - 400 K/uL 128(L) 99(L) 91(L)   CMP Latest Ref Rng & Units 06/11/2020 06/09/2020 06/08/2020  Glucose 70 - 99 mg/dL 409(W) 119(J) 478(G)  BUN 6 - 20 mg/dL 10 15 12   Creatinine 0.61 - 1.24 mg/dL 9.56 2.13  Sodium 135 - 145 mmol/L 139 136 135  Potassium 3.5 - 5.1 mmol/L 3.8 3.6 3.7  Chloride 98 - 111 mmol/L 107 107 106  CO2 22 - 32 mmol/L 25 25 25   Calcium 8.9 - 10.3 mg/dL 7.7(L) 7.6(L) 7.9(L)  Total Protein 6.5 - 8.1 g/dL - - -  Total Bilirubin 0.3 - 1.2 mg/dL - - -  Alkaline Phos 38 - 126 U/L - - -  AST 15 - 41 U/L - - -  ALT 0 - 44 U/L - - -     Imaging studies:   MRI Abdomen (06/07/2020) personally reviewed and agree with radiologist report below:  IMPRESSION: The MRI confirms complete bilateral common iliac vein and external iliac vein thrombosis/DVT. There is also at least partial thrombosis of the internal iliac veins as well as the primary collateral venous drainage along the left lumbar drainage pathway, in the setting of chronic infrarenal IVC stenosis/occlusion.   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 As was previously suggested on CT and the MRI preliminary, the leading differential diagnosis of the etiology is retroperitoneal fibrosis, either idiopathic or related to mycobacterial infection. Given the absence of focal soft tissue lesion, atypical carcinoid or other mesenchymal tumor is not favored.   Complete atrophy of the right kidney parenchyma with advanced hydronephrosis/pelvicaliectasis and dilation of the proximal ureter secondary to ureteral occlusion.   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 Infectious etiology (mycobacterium, fungal) and malignant etiologies seem much less likely, and autoimmune etiology is possible but work up pending.    - As tuberculosis, infectious, and malignant etiologies, such as lymphoma, have been more or less ruled out there is a limited role for biopsy, if at all, based upon recent literature. Instead, it may be in his best interest to begin empiric treatment with the assumption that this is idiopathic retroperitoneal fibrosis, while his remaining work up is in process. The imaging findings are consistent with idiopathic RPF.  - Additional to the above, we agree with many of the other consulting services that transfer to a tertiary medical center should be pursued as these facilities would have access to more resources and providers more versed in management of this complex and rare disease process.   - Nothing further to add from general surgery standpoint  All of the above findings and recommendations were discussed with the patient and his wife via the assistance of a Spanish language interpreter and all of their questions were answered to their expressed satisfaction.  Thank you for the opportunity to participate in this patient's care.   -- 06/09/2020, PA-C [facility] 06/11/2020, 7:42 AM [phone] M-F: 7am - 4pm  I saw and evaluated the patient.  I agree with the above documentation, exam, and plan, which I have edited where appropriate. 06/13/2020  1:18 PM

## 2020-06-11 NOTE — Progress Notes (Addendum)
PROGRESS NOTE    Austin PerlaMarco Glover  QIO:[id number] DOB: 1981/10/14 DOA: 06/04/2020 PCP: Patient, No Pcp Per   Brief Narrative: Austin PerlaMarco Glover is a 39 y.o. male with no medical history presented secondary to leg pain and found to have significant LE VTE bilateral extending up to IVC in setting of retroperitoneal fibrosis.    Assessment & Plan:   Principal Problem:   Leg DVT (deep venous thromboembolism), acute, bilateral (HCC) Active Problems:   Leg pain   AKI (acute kidney injury) (HCC)   RPF (retroperitoneal fibrosis)   Hydronephrosis of right kidney   Acute bilateral DVT Patient is s/p mechanical thrombectomy/thrombolysis with angioplasty of common iliac, external iliac, common femoral, superficial femoral veins bilaterally on 7/19 and recurrent thrombectomy including the IVC, bilateral common femoral, external/common iliac veins; stent placed to bilateral iliac veins and inferior vena cava. Procoagulation workup is negative so far although ANA is positive; Factor V leiden, prothrombin gen mutation, lupus anticoagulation pending. Kappa free light chains are elevated and SPEP pending. UPEP unremarkable. 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 General surgery consulted for biopsy but concern is patient may need to be transferred to a tertiary center for management, including biopsy. Biopsy complicated for patient need to be on anticoagulation secondary to above. Discussed with oncology and unlikely malignancy per their assessment. Quantiferon gold negative. -General surgery recommendations for consideration of biopsy  AKI No baseline. Initial creatinine of 1.89 which has now improved. In setting of hydronephrosis but secondary to chronic nature, unlikely etiology. Possibly prerenal in etiology. Resolved.  Right  hydronephrosis Right renal atrophy Secondary to above. Does not appear right kidney is likely functioning. Urology consulted and no utility in stent placement secondary so kidney parenchymal loss/atrophy -Nephrology consult  Thrombocytopenia In setting of heparin drip. Concern for HIT. Heparin discontinued and patient started on argatroban IV. Heparin induced platelet ab wnl so unlikely related to HIT. Platelets remain low but stable.  -Daily CBC  Possible limb ischemia Rule out.   DVT prophylaxis: Argatroban IV Code Status:   Code Status: Full Code Family Communication: None at bedside Disposition Plan: Discharge pending continued recommendations from specialists but will likely require transfer to tertiary center   Consultants:   Vascular surgery  General surgery  Infectious disease  Medical oncology  Urology  Neprhology  Procedures:   VASCULAR SURGERY OP (06/08/2020) PROCEDURE: 1. US guidance for vascular access to bilateral popliteal vein 2. Catheter placement into bilateral common iliac veins and IVC from bilateral popliteal approach 3. IVC gram and bilateral lower extremity venogram 4.   Catheter directed thrombolysis with 6 mg of TPA to the common iliac veins, external iliac veins, common femoral veins, and superficial femoral veins bilaterally 5. Mechanical thrombectomy to the IVC, bilateral common iliac veins, bilateral external iliac veins, bilateral common femoral veins, and bilateral superficial femoral veins with the penumbra CAT 12 device 6. PTA of left common and external iliac veins with 10 mm balloon 7.   PTA of the right common femoral vein, external and common iliac veins with 10 mm balloons 8.   PTA of the IVC with 10 and 14 mm balloons 9.   Placement of catheters for continuous thrombolytic therapy with a 90 cm total length 20 cm working length catheter in the left iliac veins and a 90 cm total length 50 cm working length catheter in the IVC,  left common and external  iliac veins, and left common and superficial femoral veins  VASCULAR SURGERY OP (06/10/2020) PROCEDURE: 1. Catheter placement into IVC from bilateral popliteal approach 2. IVC gram and bilateral lower extremity venogram 3. Mechanical thrombectomy to bilateral common femoral veins, external and common iliac veins, and IVC with the penumbra CAT 12 device 4.   Kissing balloon stent placements to bilateral common iliac veins and inferior vena cava with 14 mm diameter by 8 cm length stents postdilated with a 12 mm balloons proximally and 14 mm balloon distally  Antimicrobials:  Vancomycin  Flagyl  Cefpime  Cefazolin    Subjective: Pain is improved from yesterday. Says his wife is concerned that his diagnosis from anticipated biopsy will be a bad diagnosis. Otherwise, he feels that he is doing better day by day.  Interpreter: Marcial  Objective: Vitals:   06/11/20 0400 06/11/20 0500 06/11/20 0600 06/11/20 0800  BP: 124/83 127/84 109/72 130/82  Pulse: 97 (!) 108 92 100  Resp: (!) 29 (!) 28 (!) 0 17  Temp:    99.3 F (37.4 C)  TempSrc:    Oral  SpO2: 92% 93% 92% 99%  Weight:  114 kg    Height:        Intake/Output Summary (Last 24 hours) at 06/11/2020 0832 Last data filed at 06/11/2020 0800 Gross per 24 hour  Intake 1445.24 ml  Output 9225 ml  Net -7779.76 ml   Filed Weights   06/10/20 0313 06/10/20 0720 06/11/20 0500  Weight: 119.4 kg 119.4 kg 114 kg    Examination:  General exam: Appears calm and comfortable Respiratory system: Clear to auscultation. Respiratory effort normal. Cardiovascular system: S1 & S2 heard, RRR. No murmurs, rubs, gallops or clicks. Gastrointestinal system: Abdomen is nondistended, soft and nontender. No organomegaly or masses felt. Normal bowel sounds heard. Central nervous system: Alert and oriented. No focal neurological deficits. Musculoskeletal: BLE edema. Skin: No cyanosis. Ecchymosis of left flank without  tenderness Psychiatry: Judgement and insight appear normal. Mood & affect appropriate.      Data Reviewed: I have personally reviewed following labs and imaging studies  CBC Lab Results  Component Value Date   WBC 7.4 06/11/2020   RBC 3.11 (L) 06/11/2020   HGB 9.2 (L) 06/11/2020   HCT 27.4 (L) 06/11/2020   MCV 88.1 06/11/2020   MCH 29.6 06/11/2020   PLT 128 (L) 06/11/2020   MCHC 33.6 06/11/2020   RDW 11.6 06/11/2020   LYMPHSABS 1.6 06/06/2020   MONOABS 1.0 06/06/2020   EOSABS 0.1 06/06/2020   BASOSABS 0.0 06/06/2020     Last metabolic panel Lab Results  Component Value Date   NA 139 06/11/2020   K 3.8 06/11/2020   CL 107 06/11/2020   CO2 25 06/11/2020   BUN 10 06/11/2020   CREATININE 1.12 06/11/2020   GLUCOSE 104 (H) 06/11/2020   GFRNONAA >60 06/11/2020   GFRAA >60 06/11/2020   CALCIUM 7.7 (L) 06/11/2020   PROT 6.3 (L) 06/06/2020   ALBUMIN 3.4 (L) 06/06/2020   BILITOT 1.1 06/06/2020   ALKPHOS 61 06/06/2020   AST 32 06/06/2020   ALT 24 06/06/2020   ANIONGAP 7 06/11/2020    CBG (last 3)  Recent Labs    06/10/20 2351 06/11/20 0353 06/11/20 0806  GLUCAP 108* 97 87     GFR: Estimated Creatinine Clearance: 116 mL/min (by C-G formula based on SCr of 1.12 mg/dL).  Coagulation Profile: Recent Labs  Lab 06/04/20 1018 06/08/20 0317  INR 1.2 2.2*    Recent Results (  from the past 240 hour(s))  Blood culture (routine x 2)     Status: None   Collection Time: 06/04/20  9:50 AM   Specimen: BLOOD  Result Value Ref Range Status   Specimen Description BLOOD LEFT [facility]  Final   Special Requests   Final    BOTTLES DRAWN AEROBIC AND ANAEROBIC Blood Culture adequate volume   Culture   Final    NO GROWTH 5 DAYS Performed at [facility], [address], [place], [place] [address]    Report Status 06/09/2020 FINAL  Final  Blood culture (routine x 2)     Status: None   Collection Time: 06/04/20  9:50 AM   Specimen: BLOOD  Result Value Ref Range Status     Specimen Description BLOOD RIGHT HAND  Final   Special Requests   Final    BOTTLES DRAWN AEROBIC AND ANAEROBIC Blood Culture adequate volume   Culture   Final    NO GROWTH 5 DAYS Performed at [facility], [address], [place], [place] [address]    Report Status 06/09/2020 FINAL  Final  SARS Coronavirus 2 by RT PCR (hospital order, performed in [facility] hospital lab) Nasopharyngeal Nasopharyngeal Swab     Status: None   Collection Time: 06/04/20 10:18 AM   Specimen: Nasopharyngeal Swab  Result Value Ref Range Status   SARS Coronavirus 2 NEGATIVE NEGATIVE Final    Comment: (NOTE) SARS-CoV-2 target nucleic acids are NOT DETECTED.  The SARS-CoV-2 RNA is generally detectable in upper and lower respiratory specimens during the acute phase of infection. The lowest concentration of SARS-CoV-2 viral copies this assay can detect is 250 copies / mL. A negative result does not preclude SARS-CoV-2 infection and should not be used as the sole basis for treatment or other patient management decisions.  A negative result may occur with improper specimen collection / handling, submission of specimen other than nasopharyngeal swab, presence of viral mutation(s) within the areas targeted by this assay, and inadequate number of viral copies (<250 copies / mL). A negative result must be combined with clinical observations, patient history, and epidemiological information.  Fact Sheet for Patients:   BoilerBrush.com.cy  Fact Sheet for Healthcare Providers: https://pope.com/  This test is not yet approved or  cleared by the [place] FDA and has been authorized for detection and/or diagnosis of SARS-CoV-2 by FDA under an Emergency Use Authorization (EUA).  This EUA will remain in effect (meaning this test can be used) for the duration of the COVID-19 declaration under Section 564(b)(1) of the Act, 21 U.S.C. section 360bbb-3(b)(1),  unless the authorization is terminated or revoked sooner.  Performed at [facility], [address] [place], [place] [address]   MRSA PCR Screening     Status: None   Collection Time: 06/08/20  3:25 PM   Specimen: Nasopharyngeal  Result Value Ref Range Status   MRSA by PCR NEGATIVE NEGATIVE Final    Comment:        The GeneXpert MRSA Assay (FDA approved for NASAL specimens only), is one component of a comprehensive MRSA colonization surveillance program. It is not intended to diagnose MRSA infection nor to guide or monitor treatment for MRSA infections. Performed at [facility], [address], [place], [place] [address]         Radiology Studies: PERIPHERAL VASCULAR CATHETERIZATION  Result Date: 06/10/2020 See op note       Scheduled Meds: . Chlorhexidine Gluconate Cloth  6 each Topical Daily  .  docusate sodium  100 mg Oral BID  . pantoprazole  40 mg Oral Q0600   Continuous Infusions: . sodium chloride 100 mL/hr at 06/11/20 0800  . heparin       LOS: 6 days     Jacquelin Hawking, MD [facility] 06/11/2020, 8:32 AM  If 7PM-7AM, please contact night-coverage [email or web address]

## 2020-06-11 NOTE — Progress Notes (Signed)
OT Cancellation Note  Patient Details Name: Austin Glover MRN: [id number] DOB: 12-May-1981   Cancelled Treatment:    Reason Eval/Treat Not Completed: Medical issues which prohibited therapy   Thank you for OT consult.  Chart reviewed, and pt started on heparin at approximately 10:30am today.  Pt is still within window in which he is not medically appropriate for therapy.  Will follow up at next opportunity as pt is medically appropriate. Austin Glover, OTR/L 06/11/20, 3:09 PM

## 2020-06-12 LAB — CBC
HCT: 28 % — ABNORMAL LOW (ref 39.0–52.0)
Hemoglobin: 9.4 g/dL — ABNORMAL LOW (ref 13.0–17.0)
MCH: 29.6 pg (ref 26.0–34.0)
MCHC: 33.6 g/dL (ref 30.0–36.0)
MCV: 88.1 fL (ref 80.0–100.0)
Platelets: 150 10*3/uL (ref 150–400)
RBC: 3.18 MIL/uL — ABNORMAL LOW (ref 4.22–5.81)
RDW: 11.6 % (ref 11.5–15.5)
WBC: 7.6 10*3/uL (ref 4.0–10.5)
nRBC: 0.3 % — ABNORMAL HIGH (ref 0.0–0.2)

## 2020-06-12 LAB — ANA COMPREHENSIVE PANEL
Anti JO-1: <0.2 AI (ref 0.0–0.9)
Centromere Ab Screen: <0.2 AI (ref 0.0–0.9)
Chromatin Ab SerPl-aCnc: <0.2 AI (ref 0.0–0.9)
ENA SM Ab Ser-aCnc: <0.2 AI (ref 0.0–0.9)
Ribonucleic Protein: <0.2 AI (ref 0.0–0.9)
SSA (Ro) (ENA) Antibody, IgG: <0.2 AI (ref 0.0–0.9)
SSB (La) (ENA) Antibody, IgG: <0.2 AI (ref 0.0–0.9)
Scleroderma (Scl-70) (ENA) Antibody, IgG: <0.2 AI (ref 0.0–0.9)
ds DNA Ab: 17 IU/mL — ABNORMAL HIGH (ref 0–9)

## 2020-06-12 LAB — LUPUS ANTICOAGULANT PANEL
DRVVT: 105.5 s — ABNORMAL HIGH (ref 0.0–47.0)
PTT Lupus Anticoagulant: 97.6 s — ABNORMAL HIGH (ref 0.0–51.9)

## 2020-06-12 LAB — PTT-LA MIX: PTT-LA Mix: 90.6 s — ABNORMAL HIGH (ref 0.0–48.9)

## 2020-06-12 LAB — DRVVT MIX: dRVVT Mix: 69 s — ABNORMAL HIGH (ref 0.0–40.4)

## 2020-06-12 LAB — HEPARIN LEVEL (UNFRACTIONATED)
Heparin Unfractionated: 0.54 IU/mL (ref 0.30–0.70)
Heparin Unfractionated: 0.61 IU/mL (ref 0.30–0.70)

## 2020-06-12 LAB — PROTEIN C, TOTAL: Protein C, Total: 94 % (ref 60–150)

## 2020-06-12 LAB — HISTOPLASMA ANTIGEN, URINE: Histoplasma Antigen, urine: <0.5 (ref <0.5)

## 2020-06-12 LAB — HEXAGONAL PHASE PHOSPHOLIPID: Hexagonal Phase Phospholipid: 5 s (ref 0–11)

## 2020-06-12 LAB — GLUCOSE, CAPILLARY
Glucose-Capillary: 91 mg/dL (ref 70–99)
Glucose-Capillary: 97 mg/dL (ref 70–99)

## 2020-06-12 LAB — DRVVT CONFIRM: dRVVT Confirm: 1.1 ratio (ref 0.8–1.2)

## 2020-06-12 LAB — RPR: RPR Ser Ql: NONREACTIVE

## 2020-06-12 LAB — PROTHROMBIN GENE MUTATION

## 2020-06-12 NOTE — Progress Notes (Addendum)
PROGRESS NOTE    Austin Glover  GEX:[id number] DOB: 1981-03-14 DOA: 06/04/2020 PCP: Patient, No Pcp Per   Brief Narrative: Austin Glover is a 39 y.o. male with no medical history presented secondary to leg pain and found to have significant LE VTE bilateral extending up to IVC in setting of retroperitoneal fibrosis.    Assessment & Plan:   Principal Problem:   Leg DVT (deep venous thromboembolism), acute, bilateral (HCC) Active Problems:   Leg pain   AKI (acute kidney injury) (HCC)   RPF (retroperitoneal fibrosis)   Hydronephrosis of right kidney   Acute bilateral DVT Patient is s/p mechanical thrombectomy/thrombolysis with angioplasty of common iliac, external iliac, common femoral, superficial femoral veins bilaterally on 7/19 and recurrent thrombectomy including the IVC, bilateral common femoral, external/common iliac veins; stent placed to bilateral iliac veins and inferior vena cava. Procoagulation workup is negative so far although ANA is positive; Factor V leiden, prothrombin gen mutation, lupus anticoagulation pending. Kappa free light chains are elevated and SPEP without evidence of monoclonal protein. UPEP unremarkable. -Vascular surgery recommendations -Continue heparin drip, transition to oral anticoagulation per Vascular surgery recommendations -Ambulation recommendations per vascular surgery  Retroperitoneal fibrosis Unknown etiology. IgG4 negative. ANA positive. Possibly idiopathic but not definitive per imaging. Discussed with on-call rheumatology on 7/22 who recommended not to empirically treat for primary RPF without first obtaining biopsy as diagnosis is extremely rare (in this area 1 every 10 years in his experience). [facility] contacted on 7/22 for transfer but declined. General surgery declines to perform biopsy. IR previously consulted and unable to perform biopsy. Patient may end up needing to have this managed after discharge from this  hospitalization.  AKI No baseline. Initial creatinine of 1.89 which has now improved. In setting of hydronephrosis but secondary to chronic nature, unlikely etiology. Possibly prerenal in etiology. Resolved.  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 Does not appear right kidney is likely functioning. Urology consulted and no utility in stent placement secondary so kidney parenchymal loss/atrophy. Nephrology consulted with no new recommendations.  Thrombocytopenia In setting of heparin drip. 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 Heparin discontinued and patient started on argatroban IV. Heparin induced platelet ab wnl so unlikely related to HIT. Platelets improved. Heparin restarted on 7/22 -Daily CBC  Possible limb ischemia Ruled out.   DVT prophylaxis: Heparin IV Code Status:   Code Status: Full Code Family Communication: Wife at bedside Disposition Plan: Transfer to medical floor. Attempting transfer to [facility] center. Will need PT evaluation and transition to oral anticoagulation prior to discharge. Anticipate several days before stable for discharge.   Consultants:   Vascular surgery  General surgery  Infectious disease  Medical oncology  Urology  Nephrology  Rheumatology (curbside)  Procedures:   VASCULAR SURGERY OP (06/08/2020) PROCEDURE: 1. US guidance for vascular access to bilateral popliteal vein 2. Catheter placement into bilateral common iliac veins and IVC from bilateral popliteal approach 3. IVC gram and bilateral lower extremity venogram 4.   Catheter directed thrombolysis with 6 mg of TPA to the common iliac veins, external iliac veins, common femoral veins, and superficial femoral veins bilaterally 5. Mechanical thrombectomy to the IVC, bilateral common iliac veins, bilateral external iliac veins, bilateral common femoral veins, and bilateral superficial femoral veins with the penumbra CAT 12 device 6. PTA of left  common and external iliac veins with 10 mm balloon 7.   PTA of the right common femoral vein, external and common iliac veins with 10 mm balloons  8.   PTA of the IVC with 10 and 14 mm balloons 9.   Placement of catheters for continuous thrombolytic therapy with a 90 cm total length 20 cm working length catheter in the left iliac veins and a 90 cm total length 50 cm working length catheter in the IVC, left common and external iliac veins, and left common and superficial femoral veins  VASCULAR SURGERY OP (06/10/2020) PROCEDURE: 1. Catheter placement into IVC from bilateral popliteal approach 2. IVC gram and bilateral lower extremity venogram 3. Mechanical thrombectomy to bilateral common femoral veins, external and common iliac veins, and IVC with the penumbra CAT 12 device 4.   Kissing balloon stent placements to bilateral common iliac veins and inferior vena cava with 14 mm diameter by 8 cm length stents postdilated with a 12 mm balloons proximally and 14 mm balloon distally  Antimicrobials:  Vancomycin  Flagyl  Cefpime  Cefazolin    Subjective: Some mild back pain from surgical site, otherwise no issues  Interpreter: Rocio #[id number]  Objective: Vitals:   06/12/20 0500 06/12/20 0600 06/12/20 0700 06/12/20 0800  BP: 121/85 122/76 123/80 (!) 129/88  Pulse: 90 93 97 96  Resp: (!) 28 (!) 30 (!) 27 (!) 27  Temp:      TempSrc:      SpO2: 93% 93% 91% 94%  Weight: (!) 109.5 kg     Height:        Intake/Output Summary (Last 24 hours) at 06/12/2020 [id number] Last data filed at 06/12/2020 0800 Gross per 24 hour  Intake 605.03 ml  Output 6125 ml  Net -5519.97 ml   Filed Weights   06/10/20 0720 06/11/20 0500 06/12/20 0500  Weight: 119.4 kg 114 kg (!) 109.5 kg    Examination:  General exam: Appears calm and comfortable Respiratory system: Clear to auscultation. Respiratory effort normal. Cardiovascular system: S1 & S2 heard, Tachycardia, normal rhythm. No murmurs, rubs,  gallops or clicks. Gastrointestinal system: Abdomen is nondistended, soft and nontender. No organomegaly or masses felt. Normal bowel sounds heard. Central nervous system: Alert and oriented. No focal neurological deficits. Musculoskeletal: No edema. No calf tenderness Skin: No cyanosis. Left flank ecchymosis is unchanged. Psychiatry: Judgement and insight appear normal. Mood & affect appropriate.       Data Reviewed: I have personally reviewed following labs and imaging studies  CBC Lab Results  Component Value Date   WBC 7.6 06/12/2020   RBC 3.18 (L) 06/12/2020   HGB 9.4 (L) 06/12/2020   HCT 28.0 (L) 06/12/2020   MCV 88.1 06/12/2020   MCH 29.6 06/12/2020   PLT 150 06/12/2020   MCHC 33.6 06/12/2020   RDW 11.6 06/12/2020   LYMPHSABS 1.6 06/06/2020   MONOABS 1.0 06/06/2020   EOSABS 0.1 06/06/2020   BASOSABS 0.0 06/06/2020     Last metabolic panel Lab Results  Component Value Date   NA 139 06/11/2020   K 3.8 06/11/2020   CL 107 06/11/2020   CO2 25 06/11/2020   BUN 10 06/11/2020   CREATININE 1.12 06/11/2020   GLUCOSE 104 (H) 06/11/2020   GFRNONAA >60 06/11/2020   GFRAA >60 06/11/2020   CALCIUM 7.7 (L) 06/11/2020   PROT 6.3 (L) 06/06/2020   ALBUMIN 3.4 (L) 06/06/2020   LABGLOB 2.7 06/09/2020   AGRATIO 1.0 06/09/2020   BILITOT 1.1 06/06/2020   ALKPHOS 61 06/06/2020   AST 32 06/06/2020   ALT 24 06/06/2020   ANIONGAP 7 06/11/2020    CBG (last 3)  Recent Labs  06/11/20 1608 06/11/20 1948 06/12/20 0000  GLUCAP 91 113* 97     GFR: Estimated Creatinine Clearance: 113.7 mL/min (by C-G formula based on SCr of 1.12 mg/dL).  Coagulation Profile: Recent Labs  Lab 06/08/20 0317  INR 2.2*    Recent Results (from the past 240 hour(s))  Blood culture (routine x 2)     Status: None   Collection Time: 06/04/20  9:50 AM   Specimen: BLOOD  Result Value Ref Range Status   Specimen Description BLOOD LEFT [facility]  Final   Special Requests   Final    BOTTLES DRAWN  AEROBIC AND ANAEROBIC Blood Culture adequate volume   Culture   Final    NO GROWTH 5 DAYS Performed at [facility], [address], [place], [place] [address]    Report Status 06/09/2020 FINAL  Final  Blood culture (routine x 2)     Status: None   Collection Time: 06/04/20  9:50 AM   Specimen: BLOOD  Result Value Ref Range Status   Specimen Description BLOOD RIGHT HAND  Final   Special Requests   Final    BOTTLES DRAWN AEROBIC AND ANAEROBIC Blood Culture adequate volume   Culture   Final    NO GROWTH 5 DAYS Performed at [facility], [address] [place], [place] [address]    Report Status 06/09/2020 FINAL  Final  SARS Coronavirus 2 by RT PCR (hospital order, performed in [facility] hospital lab) Nasopharyngeal Nasopharyngeal Swab     Status: None   Collection Time: 06/04/20 10:18 AM   Specimen: Nasopharyngeal Swab  Result Value Ref Range Status   SARS Coronavirus 2 NEGATIVE NEGATIVE Final    Comment: (NOTE) SARS-CoV-2 target nucleic acids are NOT DETECTED.  The SARS-CoV-2 RNA is generally detectable in upper and lower respiratory specimens during the acute phase of infection. The lowest concentration of SARS-CoV-2 viral copies this assay can detect is 250 copies / mL. A negative result does not preclude SARS-CoV-2 infection and should not be used as the sole basis for treatment or other patient management decisions.  A negative result may occur with improper specimen collection / handling, submission of specimen other than nasopharyngeal swab, presence of viral mutation(s) within the areas targeted by this assay, and inadequate number of viral copies (<250 copies / mL). A negative result must be combined with clinical observations, patient history, and epidemiological information.  Fact Sheet for Patients:   BoilerBrush.com.cy  Fact Sheet for Healthcare Providers: https://pope.com/  This test is not  yet approved or  cleared by the [place] FDA and has been authorized for detection and/or diagnosis of SARS-CoV-2 by FDA under an Emergency Use Authorization (EUA).  This EUA will remain in effect (meaning this test can be used) for the duration of the COVID-19 declaration under Section 564(b)(1) of the Act, 21 U.S.C. section 360bbb-3(b)(1), unless the authorization is terminated or revoked sooner.  Performed at [facility], [address] [place], [place] [address]   MRSA PCR Screening     Status: None   Collection Time: 06/08/20  3:25 PM   Specimen: Nasopharyngeal  Result Value Ref Range Status   MRSA by PCR NEGATIVE NEGATIVE Final    Comment:        The GeneXpert MRSA Assay (FDA approved for NASAL specimens only), is one component of a comprehensive MRSA colonization surveillance program. It is not intended to diagnose MRSA infection nor to guide or monitor treatment for MRSA infections. Performed at [facility], [address], [place], [place] [address]         Radiology Studies: No results found.      Scheduled Meds:  Chlorhexidine Gluconate Cloth  6 each Topical Daily   docusate sodium  100 mg Oral BID   pantoprazole  40 mg Oral Q0600   Continuous Infusions:  heparin 1,850 Units/hr (06/12/20 0800)     LOS: 7 days     Jacquelin Hawking, MD [facility] 06/12/2020, 8:22 AM  If 7PM-7AM, please contact night-coverage [email or web address]

## 2020-06-12 NOTE — Progress Notes (Signed)
Patient remains AOx4.  He is post-op thrombectomy of the IVC and Bilateral Iliac and Femoral veins.  His HR is NSR and his B/P WDL. Breath sounds are clear. He is consuming a regular diet.  His wife is at bedside.  He will be transferred to med -surg when a bed is available.

## 2020-06-12 NOTE — Progress Notes (Signed)
[facility] Daily Progress Note  Subjective: Patient without complaint this afternoon.  Notes continued improvement to swelling of the bilateral legs.  No issues overnight.  Objective: Vitals:   06/12/20 0600 06/12/20 0700 06/12/20 0800 06/12/20 0900  BP: 122/76 123/80 (!) 129/88 (!) 129/91  Pulse: 93 97 96 104  Resp: (!) 30 (!) 27 (!) 27 15  Temp:   99 F (37.2 C)   TempSrc:   Oral   SpO2: 93% 91% 94% 98%  Weight:      Height:        Intake/Output Summary (Last 24 hours) at 06/12/2020 1405 Last data filed at 06/12/2020 1350 Gross per 24 hour  Intake 757.77 ml  Output 7050 ml  Net -6292.23 ml   Physical Exam: A&Ox3, NAD CV: RRR Pulmonary: CTA Bilaterally Abdomen: Soft, Nontender, Nondistended Vascular:  Bilateral lower extremity: Thigh soft.  Calf soft.  Mild edema.  Warm.  No acute vascular compromise noted at this time.   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 2) Heparin antibody negative.  Transition back to heparin.  Would continue heparin until no further procedures/biopsies are planned then transition to Eliquis 10 mg twice daily x7 days followed by 5 mg twice daily 3) elevate legs 4) ambulation, out of bed, PT/OT 5) no further interventions planned at this time  Discussed with Dr. Wallis Mart  Jayren Cease PA-C 06/12/2020 2:05 PM

## 2020-06-12 NOTE — Plan of Care (Signed)
Patient remains Austin Glover.  His wife remains at his bedside and assists him with his dialy cares.  Davante walks to the toilet form his bed while pushing the IV pole.  He is still receiving 1850units/hr Heparin.  He had a large B/M today.  He has consumed all of his meals.  Delivered oxy 10mg  for pain and Morphine 3mg  for pain 7/10.  Patient has orders for the floor.  He was evaluated by PT today with success.

## 2020-06-12 NOTE — Progress Notes (Signed)
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  No Known Allergies  Patient Measurements: Height: 5\' 11"  (180.3 cm) Weight: (!) 109.5 kg (241 lb 6.5 oz) IBW/kg (Calculated) : 75.3    Vital Signs: Temp: 98.8 F (37.1 C) (07/23 0000) BP: 122/76 (07/23 0600) Pulse Rate: 93 (07/23 0600)  Labs: Recent Labs    06/09/20 0732 06/09/20 1241 06/10/20 0143 06/10/20 0143 06/10/20 0411 06/10/20 1302 06/10/20 1459 06/11/20 0513 06/11/20 1656 06/12/20 0403  HGB 9.6*  --  9.8*   < >  --   --   --  9.2*  --  9.4*  HCT 27.8*  --  28.2*  --   --   --   --  27.4*  --  28.0*  PLT 91*  --  99*  --   --   --   --  128*  --  150  APTT  --    < > 84*  --    < > 67* 77* 78*  --   --   HEPARINUNFRC <0.10*  --   --   --   --   --   --   --  0.21* 0.54  CREATININE  --   --   --   --   --   --   --  1.12  --   --    < > = values in this interval not displayed.    Estimated Creatinine Clearance: 113.7 mL/min (by C-G formula based on SCr of 1.12 mg/dL).  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 Patient is s/p thrombectomy. The patient had a decrease in platelets following Heparin infusion, HIT ordered. Patient was switched to argatroban. HIT negative 7/17. Patient to switch back to heparin infusion. Argatroban stopped approximately 0830 07/22, heparin infusion was started at approximately 1030am 07/22  0723 0403 HL 0.54, therapeutic x 1, CBC stable  Goal of Therapy:  HL 0.3 - 0.7  Monitor platelets by anticoagulation protocol: Yes   Plan:   Anti-Xa therapeutic: continue heparin infusion rate at 1850 units/hr  Re-check anti-Xa level 6 hours to confirm  CBC daily while on heparin drip:  8/22, PharmD Clinical Pharmacist 06/12/2020 6:38 AM

## 2020-06-12 NOTE — Evaluation (Signed)
Occupational Therapy Evaluation Patient Details Name: Austin Glover MRN: [id number] DOB: June 11, 1981 Today's Date: 06/12/2020    History of Present Illness Austin Glover is a 39 y.o. male presents to the ED via EMS with complaint of low back and bilateral leg pain that started yesterday. He was admitted with critical lower limb ischemia, with PAD with CT scan suggesting occlusion of both popliteal arteries and no flow seen distally. CT angio most notable for possible acute embolic etiology to the bilateral legs however during angiogram patient was found to have normal atherosclerotic/embolic disease during LE and aortic angogram (through L fem artery with star closure).  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  Procedure was performed on 06/08/20 and he was run on continuous thrombolytic therapy for 24 hours.  Patient has marked leg swelling and pain.  Venous intervention was performed to reduce the symtpoms and avoid long term postphlebitic symptoms.   Clinical Impression   Austin Glover was seen for OT evaluation this date. [employer] Video Interpreter services utilized t/o session (Interpreter ID [phone]). Pt reports that prior to hospital admission he was active and independent, caring for his 37 y.o. and 4 y.o. children, ambulating without a device, and has had no falls in the past year. He lives with his wife and 2 children in a 1 level house with 1 STE. Pt presents to OT at or near baseline level of functional independence. He continues to endorse bilateral LE pain, but demonstrates good strength, safety awareness, and coordination this date. He is independent for bed and functional mobility. Pt also endorses getting up independently to use his room commode. Pt and caregiver educated on basic falls prevention for home and hospital as well as importance of frequent mobility during hospital admission. Both return verbalize understanding of education provided. No further  skilled OT needs identified. Will sign off at this time. Please re-consult if additional OT needs arise during this admission. Do not anticipate need for f/u OT services upon hospital admission.       Follow Up Recommendations  No OT follow up    Equipment Recommendations  None recommended by OT    Recommendations for Other Services       Precautions / Restrictions Precautions Precautions: Fall Precaution Comments: low fall Restrictions Weight Bearing Restrictions: No      Mobility Bed Mobility Overal bed mobility: Modified Independent             General bed mobility comments: HOB elevated.  Transfers Overall transfer level: Independent Equipment used: None             General transfer comment: Pt performs STS from EOB w/o assist. Able to walk 2-3 feet (within limits of lines/leads) at EOB, w/o difficulty. Pt does endorse increased pain in BLE and low back with mobility.    Balance Overall balance assessment: No apparent balance deficits (not formally assessed)                                         ADL either performed or assessed with clinical judgement   ADL Overall ADL's : At baseline                                       General ADL Comments: Pt presents  at or near baseline level of functional independence for ADL management. He performs bed and functional mobility independently. Strength WNL. No sensory or cognitive deficits appreciated with assessment.     Vision Baseline Vision/History: No visual deficits Patient Visual Report: No change from baseline       Perception     Praxis      Pertinent Vitals/Pain Pain Assessment: Faces Faces Pain Scale: Hurts even more Pain Location: BLE with mobility Pain Descriptors / Indicators: Jabbing ("feels like needles in my feet") Pain Intervention(s): Limited activity within patient's tolerance;Monitored during session;Repositioned;Patient requesting pain meds-RN  notified     Hand Dominance Left   Extremity/Trunk Assessment Upper Extremity Assessment Upper Extremity Assessment: Overall WFL for tasks assessed   Lower Extremity Assessment Lower Extremity Assessment: Overall WFL for tasks assessed   Cervical / Trunk Assessment Cervical / Trunk Assessment: Normal   Communication Communication Communication: Interpreter utilized (Spanish)   Cognition Arousal/Alertness: Awake/alert Behavior During Therapy: WFL for tasks assessed/performed Overall Cognitive Status: Within Functional Limits for tasks assessed                                     General Comments       Exercises Other Exercises Other Exercises: Pt and caregiver (wife at bedside) educated on role of OT in acute setting, safe transfer techniques, and falls prevention strategies for home and hospital.   Shoulder Instructions      Home Living Family/patient expects to be discharged to:: Private residence Living Arrangements: Spouse/significant other;Children (2 young children 4 y.o. & 2 y.o.) Available Help at Discharge: Family;Available PRN/intermittently Type of Home: House Home Access: Stairs to enter [employer] of Steps: 1 Entrance Stairs-Rails: None Home Layout: One level     Bathroom Shower/Tub: Producer, television/film/video: Standard     Home Equipment: None          Prior Functioning/Environment Level of Independence: Independent        Comments: Pt active and independent PTA, walking without AD, caring for his two young children.        OT Problem List: Pain;Decreased activity tolerance;Decreased safety awareness;Impaired balance (sitting and/or standing)      OT Treatment/Interventions:      OT Goals(Current goals can be found in the care plan section) Acute Rehab OT Goals Patient Stated Goal: To go home to my children. OT Goal Formulation: All assessment and education complete, DC therapy Time For Goal  Achievement: 06/12/20 Potential to Achieve Goals: Good  OT Frequency:     Barriers to D/C:            Co-evaluation              AM-PAC OT "6 Clicks" Daily Activity     Outcome Measure Help from another person eating meals?: None Help from another person taking care of personal grooming?: None Help from another person toileting, which includes using toliet, bedpan, or urinal?: None Help from another person bathing (including washing, rinsing, drying)?: None Help from another person to put on and taking off regular upper body clothing?: None Help from another person to put on and taking off regular lower body clothing?: None 6 Click Score: 24   End of Session Equipment Utilized During Treatment: Gait belt Nurse Communication: Patient requests pain meds  Activity Tolerance: Patient tolerated treatment well Patient left: in bed;with call bell/phone within reach;with family/visitor present  OT  Visit Diagnosis: Other abnormalities of gait and mobility (R26.89);Pain Pain - Right/Left:  (Both) Pain - part of body: Leg;Ankle and joints of foot                Time: 1346-1405 OT Time Calculation (min): 19 min Charges:  OT General Charges $OT Visit: 1 Visit OT Evaluation $OT Eval Moderate Complexity: 1 Mod OT Treatments $Self Care/Home Management : 8-22 mins  Rockney Ghee, M.S., OTR/L Ascom: [phone] 06/12/20, 3:22 PM

## 2020-06-12 NOTE — Progress Notes (Signed)
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  No Known Allergies  Patient Measurements: Height: 5\' 11"  (180.3 cm) Weight: (!) 109.5 kg (241 lb 6.5 oz) IBW/kg (Calculated) : 75.3    Vital Signs: Temp: 99 F (37.2 C) (07/23 0800) Temp Source: Oral (07/23 0800) BP: 129/91 (07/23 0900) Pulse Rate: 104 (07/23 0900)  Labs: Recent Labs    06/10/20 0143 06/10/20 0143 06/10/20 0411 06/10/20 1302 06/10/20 1459 06/11/20 0513 06/11/20 1656 06/12/20 0403 06/12/20 1015  HGB 9.8*   < >  --   --   --  9.2*  --  9.4*  --   HCT 28.2*  --   --   --   --  27.4*  --  28.0*  --   PLT 99*  --   --   --   --  128*  --  150  --   APTT 84*  --    < > 67* 77* 78*  --   --   --   HEPARINUNFRC  --   --   --   --   --   --  0.21* 0.54 0.61  CREATININE  --   --   --   --   --  1.12  --   --   --    < > = values in this interval not displayed.    Estimated Creatinine Clearance: 113.7 mL/min (by C-G formula based on SCr of 1.12 mg/dL).  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 Patient is s/p thrombectomy. The patient had a decrease in platelets following Heparin infusion, HIT ordered. Patient was switched to argatroban. HIT negative 7/17. Patient to switch back to heparin infusion. Argatroban stopped approximately 0830 07/22, heparin infusion was started at approximately 1030am 07/22  0723 0403 HL 0.54, therapeutic x 1, CBC stable 0723 1015 HL 0.61, therapeutic x 2  Goal of Therapy:  HL 0.3 - 0.7  Monitor platelets by anticoagulation protocol: Yes   Plan:  HL therapeutic x 2. Continue heparin drip at 1850 units/hr. HL and CBC with morning labs.  8/22, PharmD Clinical Pharmacist 06/12/2020 10:49 AM

## 2020-06-12 NOTE — Progress Notes (Signed)
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 The RPF is not amenable to IR biopsy.  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 The question is whether steroids are needed and timing of initiation- Hospitalist  checking with urology regarding open biopsy ID will follow peripherally this weekend

## 2020-06-12 NOTE — Evaluation (Signed)
Physical Therapy Evaluation Patient Details Name: Austin Glover MRN: [id number] DOB: 04-05-1981 Today's Date: 06/12/2020   History of Present Illness  Mr. Austin Glover is a 39 y.o. male presents to the ED via EMS on 7/19 with c/o low back and bilateral leg pain that started yesterday. He was admitted with critical lower limb ischemia, with PAD with CT scan suggesting occlusion of both popliteal arteries and no flow seen distally. CT angio most notable for possible acute embolic etiology to the bilateral legs however during angiogram patient was found to have normal atherosclerotic/embolic disease during LE and aortic angogram (through L fem artery with star closure).  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  Procedure was performed on 06/08/20 and he was run on continuous thrombolytic therapy for 24 hours.  Patient has marked leg swelling and pain.  Venous intervention was performed to reduce the symptoms and avoid long term postphlebitic symptoms.  Clinical Impression  Pt lying in bed with sig other at bedside. Pt mod I for bed mobility and supervision for transfers. Pt noted to reach out for IV pole during standing to steady himself. Pt with reports of tingling in L heel, pain in LLE and R testicle with ambulation. Pt ambulated 60 feet using IV pole and intermittently without with CGA. Pt with slight LOB when turning 180 degrees and ambulates with decreased gait speed. Pt with decreased endurance, strength, and steadying at this time and would benefit from PT this acute stay to maximize functional mobility and return to full independence of PLOF and care for his children. Spanish interpreter Austin Glover utilized throughout session. Pt may benefit from HHPT after acute stay and RW at discharge depending on progress and pain levels - will update as needed.    Follow Up Recommendations Home health PT    Equipment Recommendations  None recommended by PT     Recommendations for Other Services       Precautions / Restrictions Precautions Precautions: Fall Precaution Comments: low fall Restrictions Weight Bearing Restrictions: No      Mobility  Bed Mobility Overal bed mobility: Modified Independent             General bed mobility comments: HOB elevated.  Transfers Overall transfer level: Needs assistance Equipment used: None Transfers: Sit to/from Stand Sit to Stand: Supervision         General transfer comment: SBA for sit <> stand transfer as pt reaching out for IV pole for steadying to stand  Ambulation/Gait Ambulation/Gait assistance: Min guard Gait Distance (Feet): 60 Feet Assistive device: IV Pole;None Gait Pattern/deviations: Step-through pattern Gait velocity: decreased   General Gait Details: Pt ambulated with use of IV pole for steadying and able to intermittently ambulation without AD, with CGA for steadying; pt reports dizziness and noted slight LOB with turning 180 degree turns  Stairs            Wheelchair Mobility    Modified Rankin (Stroke Patients Only)       Balance Overall balance assessment: Needs assistance Sitting-balance support: Feet supported;No upper extremity supported Sitting balance-Leahy Scale: Normal Sitting balance - Comments: no overt LOB noted seated EOB   Standing balance support: Single extremity supported Standing balance-Leahy Scale: Good Standing balance comment: pt with LUE on IV pole for steadying; no overt LOB                             Pertinent  Vitals/Pain Pain Assessment: Faces Faces Pain Scale: Hurts even more Pain Location: L heel and R testicle with mobility Pain Descriptors / Indicators: Tingling;Sharp Pain Intervention(s): Monitored during session;Repositioned    Home Living Family/patient expects to be discharged to:: Private residence Living Arrangements: Spouse/significant other;Children Available Help at Discharge:  Family;Available PRN/intermittently Type of Home: House Home Access: Stairs to enter Entrance Stairs-Rails: None Entrance Stairs-Number of Steps: 1 Home Layout: One level Home Equipment: None      Prior Function Level of Independence: Independent         Comments: Pt active and independent prior to admission, walking without AD, caring for his two young children.     Hand Dominance   Dominant Hand: Left    Extremity/Trunk Assessment   Upper Extremity Assessment Upper Extremity Assessment: Overall WFL for tasks assessed (bilat 4+/5 grossly   )    Lower Extremity Assessment Lower Extremity Assessment: Overall WFL for tasks assessed;Generalized weakness (grossly 4/5 bilaterally)    Cervical / Trunk Assessment Cervical / Trunk Assessment: Normal  Communication   Communication: Interpreter utilized (spanish)  Cognition Arousal/Alertness: Awake/alert Behavior During Therapy: WFL for tasks assessed/performed Overall Cognitive Status: Within Functional Limits for tasks assessed                                        General Comments      Exercises Other Exercises Other Exercises: Pt and caregiver (wife at bedside) educated on role of OT in acute setting, safe transfer techniques, and falls prevention strategies for home and hospital.   Assessment/Plan    PT Assessment Patient needs continued PT services  PT Problem List Decreased strength;Decreased activity tolerance;Decreased balance;Decreased mobility;Pain       PT Treatment Interventions DME instruction;Gait training;Stair training;Functional mobility training;Therapeutic activities;Therapeutic exercise;Balance training;Patient/family education    PT Goals (Current goals can be found in the Care Plan section)  Acute Rehab PT Goals Patient Stated Goal: to get stronger/have more energy PT Goal Formulation: With patient Time For Goal Achievement: 06/26/20 Potential to Achieve Goals: Good     Frequency Min 2X/week   Barriers to discharge        Co-evaluation               AM-PAC PT "6 Clicks" Mobility  Outcome Measure Help needed turning from your back to your side while in a flat bed without using bedrails?: None Help needed moving from lying on your back to sitting on the side of a flat bed without using bedrails?: None Help needed moving to and from a bed to a chair (including a wheelchair)?: A Little Help needed standing up from a chair using your arms (e.g., wheelchair or bedside chair)?: A Little Help needed to walk in hospital room?: A Little Help needed climbing 3-5 steps with a railing? : A Little 6 Click Score: 20    End of Session Equipment Utilized During Treatment: Gait belt Activity Tolerance: Patient limited by fatigue;Patient limited by pain;Patient tolerated treatment well Patient left: in bed;with call bell/phone within reach;with family/visitor present Nurse Communication: Mobility status PT Visit Diagnosis: Unsteadiness on feet (R26.81);Muscle weakness (generalized) (M62.81);Dizziness and giddiness (R42);Pain Pain - Right/Left: Left Pain - part of body: Leg    Time: 6378-5885 PT Time Calculation (min) (ACUTE ONLY): 24 min   Charges:              Frederich Chick, SPT  Edson Snowball  Kanon Colunga 06/12/2020, 4:11 PM

## 2020-06-13 LAB — CBC
HCT: 31.1 % — ABNORMAL LOW (ref 39.0–52.0)
Hemoglobin: 10.3 g/dL — ABNORMAL LOW (ref 13.0–17.0)
MCH: 29.2 pg (ref 26.0–34.0)
MCHC: 33.1 g/dL (ref 30.0–36.0)
MCV: 88.1 fL (ref 80.0–100.0)
Platelets: 198 10*3/uL (ref 150–400)
RBC: 3.53 MIL/uL — ABNORMAL LOW (ref 4.22–5.81)
RDW: 11.9 % (ref 11.5–15.5)
WBC: 7.6 10*3/uL (ref 4.0–10.5)
nRBC: 0.4 % — ABNORMAL HIGH (ref 0.0–0.2)

## 2020-06-13 LAB — GLUCOSE, CAPILLARY
Glucose-Capillary: 93 mg/dL (ref 70–99)
Glucose-Capillary: 88 mg/dL (ref 70–99)
Glucose-Capillary: 97 mg/dL (ref 70–99)
Glucose-Capillary: 95 mg/dL (ref 70–99)

## 2020-06-13 LAB — HEPARIN LEVEL (UNFRACTIONATED): Heparin Unfractionated: 0.64 IU/mL (ref 0.30–0.70)

## 2020-06-13 LAB — FUNGITELL, SERUM: Fungitell Result: 54 pg/mL (ref <80)

## 2020-06-13 NOTE — Plan of Care (Incomplete)
Pt is still waiting for floor bed, VSS, Prn pain medication given x2times, pt had uneventful night, bilat leg warm to touch, color WNL and Pedal pulses is equal and strong and palpable.

## 2020-06-13 NOTE — Progress Notes (Signed)
PROGRESS NOTE    Claire Bridge  ZHG:[id number] DOB: 01/03/1981 DOA: 06/04/2020 PCP: Patient, No Pcp Per   Brief Narrative: Caldwell Kronenberger is a 39 y.o. male with no medical history presented secondary to leg pain and found to have significant LE VTE bilateral extending up to IVC in setting of retroperitoneal fibrosis.    Assessment & Plan:   Principal Problem:   Leg DVT (deep venous thromboembolism), acute, bilateral (HCC) Active Problems:   Leg pain   AKI (acute kidney injury) (HCC)   RPF (retroperitoneal fibrosis)   Hydronephrosis of right kidney  Acute bilateral DVT Patient is s/p mechanical thrombectomy/thrombolysis with angioplasty of common iliac, external iliac, common femoral, superficial femoral veins bilaterally on 7/19 and recurrent thrombectomy including the IVC, bilateral common femoral, external/common iliac veins; stent placed to bilateral iliac veins and inferior vena cava. Procoagulation workup is negative so far although ANA is positive; Factor V leiden, prothrombin gen mutation, lupus anticoagulation pending. Kappa free light chains are elevated and SPEP without evidence of monoclonal protein. UPEP unremarkable. Ambulated well with PT -Continue heparin drip, transition to oral anticoagulation per Vascular surgery recommendations  Retroperitoneal fibrosis Unknown etiology. IgG4 negative. ANA positive. Possibly idiopathic but not definitive per imaging. Discussed with on-call rheumatology on 7/22 who recommended not to empirically treat for primary RPF without first obtaining biopsy as diagnosis is extremely rare (in this area 1 every 10 years in his experience). [facility] contacted on 7/22 for transfer but declined. General surgery declines to perform biopsy. IR previously consulted and unable to perform biopsy. Patient may end up needing to have this managed after discharge from this hospitalization. -General surgery reconsulted for second opinion; if still  declining biopsy, will contact general surgery at [facility] for consideration.  AKI No baseline. Initial creatinine of 1.89 which has now improved. In setting of hydronephrosis but secondary to chronic nature, unlikely etiology. Possibly prerenal in etiology. Resolved.  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 Does not appear right kidney is likely functioning. Urology consulted and no utility in stent placement secondary so kidney parenchymal loss/atrophy. Nephrology consulted with no new recommendations.  Thrombocytopenia In setting of heparin drip. 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 Heparin discontinued and patient started on argatroban IV. Heparin induced platelet ab wnl so unlikely related to HIT. Platelets improved. Heparin restarted on 7/22 -Daily CBC  Possible limb ischemia Ruled out.   DVT prophylaxis: Heparin IV Code Status:   Code Status: Full Code Family Communication: Wife at bedside Disposition Plan: Med-surg. Attempting transfer to [facility] center. Discharge in several days pending transition to oral anticoagulation and possible biopsy   Consultants:   Vascular surgery  General surgery x2  Infectious disease  Medical oncology  Urology  Nephrology  Rheumatology (curbside)  Procedures:   VASCULAR SURGERY OP (06/08/2020) PROCEDURE: 1. US guidance for vascular access to bilateral popliteal vein 2. Catheter placement into bilateral common iliac veins and IVC from bilateral popliteal approach 3. IVC gram and bilateral lower extremity venogram 4.   Catheter directed thrombolysis with 6 mg of TPA to the common iliac veins, external iliac veins, common femoral veins, and superficial femoral veins bilaterally 5. Mechanical thrombectomy to the IVC, bilateral common iliac veins, bilateral external iliac veins, bilateral common femoral veins, and bilateral superficial femoral veins with the penumbra CAT 12 device 6. PTA of left common and  external iliac veins with 10 mm balloon 7.   PTA of the right common femoral vein, external and common iliac veins  with 10 mm balloons 8.   PTA of the IVC with 10 and 14 mm balloons 9.   Placement of catheters for continuous thrombolytic therapy with a 90 cm total length 20 cm working length catheter in the left iliac veins and a 90 cm total length 50 cm working length catheter in the IVC, left common and external iliac veins, and left common and superficial femoral veins  VASCULAR SURGERY OP (06/10/2020) PROCEDURE: 1. Catheter placement into IVC from bilateral popliteal approach 2. IVC gram and bilateral lower extremity venogram 3. Mechanical thrombectomy to bilateral common femoral veins, external and common iliac veins, and IVC with the penumbra CAT 12 device 4.   Kissing balloon stent placements to bilateral common iliac veins and inferior vena cava with 14 mm diameter by 8 cm length stents postdilated with a 12 mm balloons proximally and 14 mm balloon distally  Antimicrobials:  Vancomycin  Flagyl  Cefpime  Cefazolin    Subjective: Some swelling of left side of scrotum while ambulating. No other issues.  Interpreter: Felipe # [id number]  Objective: Vitals:   06/12/20 1951 06/12/20 2100 06/13/20 0042 06/13/20 0439  BP: 124/80  125/84 122/82  Pulse:  103 95 78  Resp:  (!) 28 18 19   Temp: 100 F (37.8 C)  99.2 F (37.3 C) 98.3 F (36.8 C)  TempSrc: Oral  Oral Oral  SpO2:  94% 97% 97%  Weight:      Height:        Intake/Output Summary (Last 24 hours) at 06/13/2020 0735 Last data filed at 06/13/2020 0442 Gross per 24 hour  Intake 1119.14 ml  Output 6705 ml  Net -5585.86 ml   Filed Weights   06/10/20 0720 06/11/20 0500 06/12/20 0500  Weight: 119.4 kg 114 kg (!) 109.5 kg    Examination:  General exam: Appears calm and comfortable Respiratory system: Clear to auscultation. Respiratory effort normal. Cardiovascular system: S1 & S2 heard, RRR. No murmurs, rubs,  gallops or clicks. Gastrointestinal system: Abdomen is nondistended, soft and nontender. No organomegaly or masses felt. Normal bowel sounds heard. Central nervous system: Alert and oriented. No focal neurological deficits. Musculoskeletal: No edema. No calf tenderness Skin: No cyanosis. No rashes Psychiatry: Judgement and insight appear normal. Mood & affect appropriate.       Data Reviewed: I have personally reviewed following labs and imaging studies  CBC Lab Results  Component Value Date   WBC 7.6 06/13/2020   RBC 3.53 (L) 06/13/2020   HGB 10.3 (L) 06/13/2020   HCT 31.1 (L) 06/13/2020   MCV 88.1 06/13/2020   MCH 29.2 06/13/2020   PLT 198 06/13/2020   MCHC 33.1 06/13/2020   RDW 11.9 06/13/2020   LYMPHSABS 1.6 06/06/2020   MONOABS 1.0 06/06/2020   EOSABS 0.1 06/06/2020   BASOSABS 0.0 06/06/2020     Last metabolic panel Lab Results  Component Value Date   NA 139 06/11/2020   K 3.8 06/11/2020   CL 107 06/11/2020   CO2 25 06/11/2020   BUN 10 06/11/2020   CREATININE 1.12 06/11/2020   GLUCOSE 104 (H) 06/11/2020   GFRNONAA >60 06/11/2020   GFRAA >60 06/11/2020   CALCIUM 7.7 (L) 06/11/2020   PROT 6.3 (L) 06/06/2020   ALBUMIN 3.4 (L) 06/06/2020   LABGLOB 2.7 06/09/2020   AGRATIO 1.0 06/09/2020   BILITOT 1.1 06/06/2020   ALKPHOS 61 06/06/2020   AST 32 06/06/2020   ALT 24 06/06/2020   ANIONGAP 7 06/11/2020    CBG (last 3)  Recent Labs    06/11/20 1948 06/12/20 0000 06/12/20 1655  GLUCAP 113* 97 91     GFR: Estimated Creatinine Clearance: 113.7 mL/min (by C-G formula based on SCr of 1.12 mg/dL).  Coagulation Profile: Recent Labs  Lab 06/08/20 0317  INR 2.2*    Recent Results (from the past 240 hour(s))  Blood culture (routine x 2)     Status: None   Collection Time: 06/04/20  9:50 AM   Specimen: BLOOD  Result Value Ref Range Status   Specimen Description BLOOD LEFT [facility]  Final   Special Requests   Final    BOTTLES DRAWN AEROBIC AND ANAEROBIC Blood  Culture adequate volume   Culture   Final    NO GROWTH 5 DAYS Performed at [facility], [address], [place], [place] [address]    Report Status 06/09/2020 FINAL  Final  Blood culture (routine x 2)     Status: None   Collection Time: 06/04/20  9:50 AM   Specimen: BLOOD  Result Value Ref Range Status   Specimen Description BLOOD RIGHT HAND  Final   Special Requests   Final    BOTTLES DRAWN AEROBIC AND ANAEROBIC Blood Culture adequate volume   Culture   Final    NO GROWTH 5 DAYS Performed at [facility], [address] [place], [place] [address]    Report Status 06/09/2020 FINAL  Final  SARS Coronavirus 2 by RT PCR (hospital order, performed in [facility] hospital lab) Nasopharyngeal Nasopharyngeal Swab     Status: None   Collection Time: 06/04/20 10:18 AM   Specimen: Nasopharyngeal Swab  Result Value Ref Range Status   SARS Coronavirus 2 NEGATIVE NEGATIVE Final    Comment: (NOTE) SARS-CoV-2 target nucleic acids are NOT DETECTED.  The SARS-CoV-2 RNA is generally detectable in upper and lower respiratory specimens during the acute phase of infection. The lowest concentration of SARS-CoV-2 viral copies this assay can detect is 250 copies / mL. A negative result does not preclude SARS-CoV-2 infection and should not be used as the sole basis for treatment or other patient management decisions.  A negative result may occur with improper specimen collection / handling, submission of specimen other than nasopharyngeal swab, presence of viral mutation(s) within the areas targeted by this assay, and inadequate number of viral copies (<250 copies / mL). A negative result must be combined with clinical observations, patient history, and epidemiological information.  Fact Sheet for Patients:   BoilerBrush.com.cy  Fact Sheet for Healthcare Providers: https://pope.com/  This test is not yet approved or  cleared by  the [place] FDA and has been authorized for detection and/or diagnosis of SARS-CoV-2 by FDA under an Emergency Use Authorization (EUA).  This EUA will remain in effect (meaning this test can be used) for the duration of the COVID-19 declaration under Section 564(b)(1) of the Act, 21 U.S.C. section 360bbb-3(b)(1), unless the authorization is terminated or revoked sooner.  Performed at [facility], [address] [place], [place] [address]   MRSA PCR Screening     Status: None   Collection Time: 06/08/20  3:25 PM   Specimen: Nasopharyngeal  Result Value Ref Range Status   MRSA by PCR NEGATIVE NEGATIVE Final    Comment:        The GeneXpert MRSA Assay (FDA approved for NASAL specimens only), is one component of a comprehensive MRSA colonization surveillance program. It is not intended to diagnose MRSA infection nor to guide or monitor treatment for  MRSA infections. Performed at [facility], [address] [place], [place] [id number]         Radiology Studies: No results found.      Scheduled Meds: . Chlorhexidine Gluconate Cloth  6 each Topical Daily  . docusate sodium  100 mg Oral BID  . pantoprazole  40 mg Oral Q0600   Continuous Infusions: . heparin 1,850 Units/hr (06/13/20 0442)     LOS: 8 days     Jacquelin Hawkingalph Lilybeth Vien, MD [facility] 06/13/2020, 7:35 AM  If 7PM-7AM, please contact night-coverage [email or web address]

## 2020-06-13 NOTE — Progress Notes (Signed)
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  No Known Allergies  Patient Measurements: Height: 5\' 11"  (180.3 cm) Weight: (!) 109.5 kg (241 lb 6.5 oz) IBW/kg (Calculated) : 75.3    Vital Signs: Temp: 98.3 F (36.8 C) (07/24 0439) Temp Source: Oral (07/24 0439) BP: 122/82 (07/24 0439) Pulse Rate: 78 (07/24 0439)  Labs: Recent Labs     0000 06/10/20 1302 06/10/20 1459 06/11/20 0513 06/11/20 1656 06/12/20 0403 06/12/20 1015 06/13/20 0448  HGB   < >  --   --  9.2*  --  9.4*  --  10.3*  HCT  --   --   --  27.4*  --  28.0*  --  31.1*  PLT  --   --   --  128*  --  150  --  198  APTT  --  67* 77* 78*  --   --   --   --   HEPARINUNFRC  --   --   --   --    < > 0.54 0.61 0.64  CREATININE  --   --   --  1.12  --   --   --   --    < > = values in this interval not displayed.    Estimated Creatinine Clearance: 113.7 mL/min (by C-G formula based on SCr of 1.12 mg/dL).  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 Patient is s/p thrombectomy. The patient had a decrease in platelets following Heparin infusion, HIT ordered. Patient was switched to argatroban. HIT negative 7/17. Patient to switch back to heparin infusion. Argatroban stopped approximately 0830 07/22, heparin infusion was started at approximately 1030am 07/22  0723 0403 HL 0.54, therapeutic x 1, CBC stable 0723 1015 HL 0.61, therapeutic x 2 0724 0448 HL 0.64, therapeutic x 3  Goal of Therapy:  HL 0.3 - 0.7  Monitor platelets by anticoagulation protocol: Yes   Plan:  HL therapeutic x 3. Continue heparin drip at 1850 units/hr. HL and CBC with morning labs.  8/22, PharmD Clinical Pharmacist 06/13/2020 6:28 AM

## 2020-06-13 NOTE — Consult Note (Signed)
SURGICAL CONSULTATION NOTE   HISTORY OF PRESENT ILLNESS (HPI):  39 y.o. male admitted to [facility] due to bilateral PE and multiple DVTs.  Patient s/p pulmonary thrombectomies and vena cava and bilateral iliac vein thrombectomy and stent placement.  This is a very complex case of a patient with multiple DVTs of unknown etiology.  Upon multiple images the only significant finding is a completely hydronephrosis of the right kidney with atrophy and retroperitoneal fibrosis.  I was consulted by Dr. Caleb Popp for evaluation of possible biopsy of the retroperitoneum.  The moment patient reports pain has been controlled.  Pain radiation.  No alleviating or aggravating factor.  PAST MEDICAL HISTORY (PMH):  History reviewed. No pertinent past medical history.   PAST SURGICAL HISTORY (PSH):  Past Surgical History:  Procedure Laterality Date  . LOWER EXTREMITY ANGIOGRAPHY Right 06/04/2020   Procedure: Lower Extremity Angiography;  Surgeon: Annice Needy, MD;  Location: [facility] INVASIVE CV LAB;  Service: Cardiovascular;  Laterality: Right;  . PERIPHERAL VASCULAR THROMBECTOMY Bilateral 06/08/2020   Procedure: PERIPHERAL VASCULAR THROMBECTOMY, BLE and IVC;  Surgeon: Annice Needy, MD;  Location: [facility] INVASIVE CV LAB;  Service: Cardiovascular;  Laterality: Bilateral;  . PERIPHERAL VASCULAR THROMBECTOMY Bilateral 06/10/2020   Procedure: PERIPHERAL VASCULAR THROMBECTOMY;  Surgeon: Annice Needy, MD;  Location: [facility] INVASIVE CV LAB;  Service: Cardiovascular;  Laterality: Bilateral;  . TONSILLECTOMY       MEDICATIONS:  Prior to Admission medications   Not on File     ALLERGIES:  No Known Allergies   SOCIAL HISTORY:  Social History   Socioeconomic History  . Marital status: Married    Spouse name: Not on file  . Number of children: Not on file  . Years of education: Not on file  . Highest education level: Not on file  Occupational History  . Not on file  Tobacco Use  . Smoking status: Never Smoker  .  Smokeless tobacco: Never Used  Vaping Use  . Vaping Use: Never used  Substance and Sexual Activity  . Alcohol use: Yes    Comment: occasional  . Drug use: Never  . Sexual activity: Yes  Other Topics Concern  . Not on file  Social History Narrative  . Not on file   Social Determinants of Health   Financial Resource Strain:   . Difficulty of Paying Living Expenses:   Food Insecurity:   . Worried About Programme researcher, broadcasting/film/video in the Last Year:   . Barista in the Last Year:   Transportation Needs:   . Freight forwarder (Medical):   Marland Kitchen Lack of Transportation (Non-Medical):   Physical Activity:   . Days of Exercise per Week:   . Minutes of Exercise per Session:   Stress:   . Feeling of Stress :   Social Connections:   . Frequency of Communication with Friends and Family:   . Frequency of Social Gatherings with Friends and Family:   . Attends Religious Services:   . Active Member of Clubs or Organizations:   . Attends Banker Meetings:   Marland Kitchen Marital Status:   Intimate Partner Violence:   . Fear of Current or Ex-Partner:   . Emotionally Abused:   Marland Kitchen Physically Abused:   . Sexually Abused:       FAMILY HISTORY:  Family History  Problem Relation Age of Onset  . Diabetes Mellitus II Father   . Kidney Stones Father      REVIEW OF SYSTEMS:  Constitutional: denies weight loss, fever, chills, or sweats  Eyes: denies any other vision changes, history of eye injury  ENT: denies sore throat, hearing problems  Respiratory: denies shortness of breath, wheezing  Cardiovascular: denies chest pain, palpitations  Gastrointestinal: abdominal pain, nausea and vomiting Genitourinary: denies burning with urination or urinary frequency Musculoskeletal: denies any other joint pains or cramps.  Positive for leg swelling Skin: denies any other rashes or skin discolorations  Neurological: denies any other headache, dizziness, weakness  Psychiatric: denies any other  depression, anxiety   All other review of systems were negative   VITAL SIGNS:  Temp:  [98.3 F (36.8 C)-100 F (37.8 C)] 98.9 F (37.2 C) (07/24 1007) Pulse Rate:  [78-120] 101 (07/24 1007) Resp:  [18-30] 19 (07/24 0439) BP: (119-131)/(77-85) 131/85 (07/24 1007) SpO2:  [88 %-99 %] 96 % (07/24 1007)     Height: 5\' 11"  (180.3 cm) Weight: (!) 109.5 kg BMI (Calculated): 33.68   INTAKE/OUTPUT:  This shift: Total I/O In: 18.5 [I.V.:18.5] Out: -   Last 2 shifts: @IOLAST2SHIFTS @   PHYSICAL EXAM:  Constitutional:  -- Normal body habitus  -- Awake, alert, and oriented x3  Eyes:  -- Pupils equally round and reactive to light  -- No scleral icterus  Ear, nose, and throat:  -- No jugular venous distension  Pulmonary:  -- No crackles  -- Equal breath sounds bilaterally -- Breathing non-labored at rest Cardiovascular:  -- S1, S2 present  -- No pericardial rubs Gastrointestinal:  -- Abdomen soft, nontender, non-distended, no guarding or rebound tenderness -- No abdominal masses appreciated, pulsatile or otherwise  Musculoskeletal and Integumentary:  -- Wounds: None appreciated -- Extremities: B/L UE and LE FROM, hands and feet warm, bilateral lower extremity swelling Neurologic:  -- Motor function: intact and symmetric -- Sensation: intact and symmetric   Labs:  CBC Latest Ref Rng & Units 06/13/2020 06/12/2020 06/11/2020  WBC 4.0 - 10.5 K/uL 7.6 7.6 7.4  Hemoglobin 13.0 - 17.0 g/dL 10.3(L) 9.4(L) 9.2(L)  Hematocrit 39 - 52 % 31.1(L) 28.0(L) 27.4(L)  Platelets 150 - 400 K/uL 198 150 128(L)   CMP Latest Ref Rng & Units 06/11/2020 06/09/2020 06/08/2020  Glucose 70 - 99 mg/dL 06/11/2020) 06/10/2020) 546(E)  BUN 6 - 20 mg/dL 10 15 12   Creatinine 0.61 - 1.24 mg/dL 703(J 009(F  Sodium 135 - 145 mmol/L 139 136 135  Potassium 3.5 - 5.1 mmol/L 3.8 3.6 3.7  Chloride 98 - 111 mmol/L 107 107 106  CO2 22 - 32 mmol/L 25 25 25   Calcium 8.9 - 10.3 mg/dL 7.7(L) 7.6(L) 7.9(L)  Total Protein 6.5 - 8.1  g/dL - - -  Total Bilirubin 0.3 - 1.2 mg/dL - - -  Alkaline Phos 38 - 126 U/L - - -  AST 15 - 41 U/L - - -  ALT 0 - 44 U/L - - -    Imaging studies:          Assessment/Plan:  39 y.o. male with extensive DVT and bilateral PE status post thrombectomy.  This is a very complex case of the patient with multiple extensive DVTs and bilateral PE.  This issue has been adequately treated by vascular service with thrombectomies and stent placements.  I was consulted for evaluation of possible biopsy of retroperitoneal fibrosis.  I personally evaluated the images.  The patient has a large hydronephrotic atrophied right kidney.  This is most likely from chronic obstruction due to these fibrosis.  The etiology of the fibrosis is  unknown at this moment.  And biopsied was recommended by hematology/oncology for further decision-making regarding his therapy.  I discussed this case and images with vascular service and body imaging radiologist.  There is no specific area on his retroperitoneum that looks ideal for biopsy for final diagnosis of the etiology of his fibrosis.  The large mass on the right retroperitoneum is a hydronephrosis of the right kidney.  There is no specific mass and the area of fibrosis is very low yield for any diagnosis.  I even asked the body imaging radiologist if this can be accessed percutaneously and he strongly feels that any tissue taken there percutaneously will not be diagnostic.  He also agreed that an open or laparoscopic surgery of retroperitoneal exploration for biopsy has a very high morbidity risk and a very low yield of any pathology.  I honestly cannot give any good recommendation regarding on how to get a biopsy of what ever is causing this patient issue.  Another alternative will be to discuss this case in a multidisciplinary form (which I have been doing individually with different specialties) versus another opinion in a tertiary center.  Gae Gallop, MD

## 2020-06-13 NOTE — Progress Notes (Signed)
Patient remains AOx4.  His wife accompanies him at the bedside.  He has not asked for pain medicine.  0/10.  He consumed a good portion of his breakfast.  He is still awaiting a consult for the biopsy that is needed d/t pathology of the renal organ.  Neurovascular check is good. Will continue to monitor.

## 2020-06-13 NOTE — Progress Notes (Signed)
Patient remains AOx4.  He will be transferred to room 148.  His UOP is very good.  He is making yellow urine clear.  His neurovascular checks are strong.  No events to note.

## 2020-06-14 LAB — GLUCOSE, CAPILLARY
Glucose-Capillary: 93 mg/dL (ref 70–99)
Glucose-Capillary: 155 mg/dL — ABNORMAL HIGH (ref 70–99)
Glucose-Capillary: 96 mg/dL (ref 70–99)
Glucose-Capillary: 90 mg/dL (ref 70–99)

## 2020-06-14 LAB — CBC
HCT: 31.2 % — ABNORMAL LOW (ref 39.0–52.0)
Hemoglobin: 10.3 g/dL — ABNORMAL LOW (ref 13.0–17.0)
MCH: 29.3 pg (ref 26.0–34.0)
MCHC: 33 g/dL (ref 30.0–36.0)
MCV: 88.6 fL (ref 80.0–100.0)
Platelets: 234 10*3/uL (ref 150–400)
RBC: 3.52 MIL/uL — ABNORMAL LOW (ref 4.22–5.81)
RDW: 11.8 % (ref 11.5–15.5)
WBC: 8 10*3/uL (ref 4.0–10.5)
nRBC: 0.5 % — ABNORMAL HIGH (ref 0.0–0.2)

## 2020-06-14 LAB — HEPARIN LEVEL (UNFRACTIONATED): Heparin Unfractionated: 0.66 IU/mL (ref 0.30–0.70)

## 2020-06-14 LAB — MISC LABCORP TEST (SEND OUT): Labcorp test code: 164284

## 2020-06-14 NOTE — Progress Notes (Signed)
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  No Known Allergies  Patient Measurements: Height: 5\' 11"  (180.3 cm) Weight: (!) 102.4 kg (225 lb 11.2 oz) IBW/kg (Calculated) : 75.3    Vital Signs: Temp: 97.9 F (36.6 C) (07/25 0511) Temp Source: Oral (07/25 0511) BP: 130/85 (07/25 0511) Pulse Rate: 78 (07/25 0511)  Labs: Recent Labs    06/12/20 0403 06/12/20 0403 06/12/20 1015 06/13/20 0448 06/14/20 0443  HGB 9.4*   < >  --  10.3* 10.3*  HCT 28.0*  --   --  31.1* 31.2*  PLT 150  --   --  198 234  HEPARINUNFRC 0.54   < > 0.61 0.64 0.66   < > = values in this interval not displayed.    Estimated Creatinine Clearance: 110 mL/min (by C-G formula based on SCr of 1.12 mg/dL).  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 Patient is s/p thrombectomy. The patient had a decrease in platelets following Heparin infusion, HIT ordered. Patient was switched to argatroban. HIT negative 7/17. Patient to switch back to heparin infusion. Argatroban stopped approximately 0830 07/22, heparin infusion was started at approximately 1030am 07/22  0723 0403 HL 0.54, therapeutic x 1, CBC stable 0723 1015 HL 0.61, therapeutic x 2 0724 0448 HL 0.64, therapeutic x 3 0725 0443 HL 0.66, therapeutic x 4.  CBC stable  Goal of Therapy:  HL 0.3 - 0.7  Monitor platelets by anticoagulation protocol: Yes   Plan:  HL therapeutic x 4. Continue heparin drip at 1850 units/hr. HL and CBC with morning labs.  8/22, PharmD Clinical Pharmacist 06/14/2020 6:30 AM

## 2020-06-14 NOTE — Progress Notes (Signed)
PROGRESS NOTE    Zoe Nordin  UKG:[id number] DOB: Dec 15, 1980 DOA: 06/04/2020 PCP: Patient, No Pcp Per   Brief Narrative: Austin Glover is a 39 y.o. male with no medical history presented secondary to leg pain and found to have significant LE VTE bilateral extending up to IVC in setting of retroperitoneal fibrosis.    Assessment & Plan:   Principal Problem:   Leg DVT (deep venous thromboembolism), acute, bilateral (HCC) Active Problems:   Leg pain   AKI (acute kidney injury) (HCC)   RPF (retroperitoneal fibrosis)   Hydronephrosis of right kidney  Acute bilateral DVT Patient is s/p mechanical thrombectomy/thrombolysis with angioplasty of common iliac, external iliac, common femoral, superficial femoral veins bilaterally on 7/19 and recurrent thrombectomy including the IVC, bilateral common femoral, external/common iliac veins; stent placed to bilateral iliac veins and inferior vena cava. Procoagulation workup is negative so far although ANA is positive; Factor V leiden, prothrombin gen mutation, lupus anticoagulation pending. Kappa free light chains are elevated and SPEP without evidence of monoclonal protein. UPEP unremarkable. Ambulated well with PT -Continue heparin drip, transition to oral anticoagulation per Vascular surgery recommendations if unable to transfer patient and no plan for biopsy  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 Possibly idiopathic but not definitive per imaging. Discussed with on-call rheumatology on 7/22 who recommended not to empirically treat for primary RPF without first obtaining biopsy as diagnosis is extremely rare (in this area 1 every 10 years in his experience). [facility] contacted on 7/22 for transfer but declined. General surgery declines to perform biopsy. IR previously consulted and unable to perform biopsy. Patient may end up needing to have this managed after discharge from this  hospitalization. Unable to safely attempt biopsy in this hospital. Discussed case with [facility] on 7/24 who will review with specialist services and decide on possible transfer -Will call [facility] back on 7/26  Positive ANA/dsDNA Patient without significant history correlating with lupus. He did have transient thrombocytopenia in setting of acute DVTs. He reports some photosensitivity but says only when he is on the beach and has no problems in other settings. -Will discuss positive dsDNA with rheumatology on 7/26  AKI No baseline. Initial creatinine of 1.89 which has now improved. In setting of hydronephrosis but secondary to chronic nature, unlikely etiology. Possibly prerenal in etiology. Resolved.  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 Does not appear right kidney is likely functioning. Urology consulted and no utility in stent placement secondary so kidney parenchymal loss/atrophy. Nephrology consulted with no new recommendations.  Thrombocytopenia In setting of heparin drip. 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 Heparin discontinued and patient started on argatroban IV. Heparin induced platelet ab wnl so unlikely related to HIT. Heparin restarted on 7/22. Thrombocytopenia resolved. -Daily CBC  Possible limb ischemia Ruled out.   DVT prophylaxis: Heparin IV Code Status:   Code Status: Full Code Family Communication: Wife at bedside Disposition Plan: Med-surg. Attempting transfer to [facility] center. Discharge in several days pending transition to oral anticoagulation and possible biopsy   Consultants:   Vascular surgery  General surgery x2  Infectious disease  Medical oncology  Urology  Nephrology  Rheumatology (curbside)  Procedures:   VASCULAR SURGERY OP (06/08/2020) PROCEDURE: 1. US guidance for vascular access to bilateral popliteal vein 2. Catheter placement into bilateral common iliac veins and IVC from bilateral popliteal approach 3.  IVC gram and bilateral lower extremity venogram 4.   Catheter directed thrombolysis with 6 mg of TPA  to the common iliac veins, external iliac veins, common femoral veins, and superficial femoral veins bilaterally 5. Mechanical thrombectomy to the IVC, bilateral common iliac veins, bilateral external iliac veins, bilateral common femoral veins, and bilateral superficial femoral veins with the penumbra CAT 12 device 6. PTA of left common and external iliac veins with 10 mm balloon 7.   PTA of the right common femoral vein, external and common iliac veins with 10 mm balloons 8.   PTA of the IVC with 10 and 14 mm balloons 9.   Placement of catheters for continuous thrombolytic therapy with a 90 cm total length 20 cm working length catheter in the left iliac veins and a 90 cm total length 50 cm working length catheter in the IVC, left common and external iliac veins, and left common and superficial femoral veins  VASCULAR SURGERY OP (06/10/2020) PROCEDURE: 1. Catheter placement into IVC from bilateral popliteal approach 2. IVC gram and bilateral lower extremity venogram 3. Mechanical thrombectomy to bilateral common femoral veins, external and common iliac veins, and IVC with the penumbra CAT 12 device 4.   Kissing balloon stent placements to bilateral common iliac veins and inferior vena cava with 14 mm diameter by 8 cm length stents postdilated with a 12 mm balloons proximally and 14 mm balloon distally  Antimicrobials:  Vancomycin  Flagyl  Cefpime  Cefazolin    Subjective: Feeling better everyday. Right testicle pain is improved from previous.  InterpreterHuntley Dec # [id number]  Objective: Vitals:   06/13/20 2105 06/14/20 0107 06/14/20 0511 06/14/20 0747  BP: 118/79 (!) 131/85 (!) 130/85 125/83  Pulse: 95 90 78 86  Resp: 18 18 15 15   Temp: 98.6 F (37 C) 98.5 F (36.9 C) 97.9 F (36.6 C) 98.9 F (37.2 C)  TempSrc: Oral Oral Oral Oral  SpO2: 97% 96% 99% 96%  Weight:        Height:        Intake/Output Summary (Last 24 hours) at 06/14/2020 1007 Last data filed at 06/14/2020 0519 Gross per 24 hour  Intake 110.74 ml  Output 2375 ml  Net -2264.26 ml   Filed Weights   06/11/20 0500 06/12/20 0500 06/13/20 1800  Weight: 114 kg (!) 109.5 kg (!) 102.4 kg    Examination:  General exam: Appears calm and comfortable Respiratory system: Clear to auscultation. Respiratory effort normal. Cardiovascular system: S1 & S2 heard, RRR. No murmurs, rubs, gallops or clicks. Gastrointestinal system: Abdomen is nondistended, soft and nontender. No organomegaly or masses felt. Normal bowel sounds heard. Central nervous system: Alert and oriented. No focal neurological deficits. Musculoskeletal: No edema. No calf tenderness Skin: No cyanosis. No rashes Psychiatry: Judgement and insight appear normal. Mood & affect appropriate.       Data Reviewed: I have personally reviewed following labs and imaging studies  CBC Lab Results  Component Value Date   WBC 8.0 06/14/2020   RBC 3.52 (L) 06/14/2020   HGB 10.3 (L) 06/14/2020   HCT 31.2 (L) 06/14/2020   MCV 88.6 06/14/2020   MCH 29.3 06/14/2020   PLT 234 06/14/2020   MCHC 33.0 06/14/2020   RDW 11.8 06/14/2020   LYMPHSABS 1.6 06/06/2020   MONOABS 1.0 06/06/2020   EOSABS 0.1 06/06/2020   BASOSABS 0.0 06/06/2020     Last metabolic panel Lab Results  Component Value Date   NA 139 06/11/2020   K 3.8 06/11/2020   CL 107 06/11/2020   CO2 25 06/11/2020   BUN 10 06/11/2020   CREATININE 1.12  06/11/2020   GLUCOSE 104 (H) 06/11/2020   GFRNONAA >60 06/11/2020   GFRAA >60 06/11/2020   CALCIUM 7.7 (L) 06/11/2020   PROT 6.3 (L) 06/06/2020   ALBUMIN 3.4 (L) 06/06/2020   LABGLOB 2.7 06/09/2020   AGRATIO 1.0 06/09/2020   BILITOT 1.1 06/06/2020   ALKPHOS 61 06/06/2020   AST 32 06/06/2020   ALT 24 06/06/2020   ANIONGAP 7 06/11/2020    CBG (last 3)  Recent Labs    06/13/20 1552 06/13/20 2150 06/14/20 0748  GLUCAP  97 95 93     GFR: Estimated Creatinine Clearance: 110 mL/min (by C-G formula based on SCr of 1.12 mg/dL).  Coagulation Profile: Recent Labs  Lab 06/08/20 0317  INR 2.2*    Recent Results (from the past 240 hour(s))  SARS Coronavirus 2 by RT PCR (hospital order, performed in [facility] hospital lab) Nasopharyngeal Nasopharyngeal Swab     Status: None   Collection Time: 06/04/20 10:18 AM   Specimen: Nasopharyngeal Swab  Result Value Ref Range Status   SARS Coronavirus 2 NEGATIVE NEGATIVE Final    Comment: (NOTE) SARS-CoV-2 target nucleic acids are NOT DETECTED.  The SARS-CoV-2 RNA is generally detectable in upper and lower respiratory specimens during the acute phase of infection. The lowest concentration of SARS-CoV-2 viral copies this assay can detect is 250 copies / mL. A negative result does not preclude SARS-CoV-2 infection and should not be used as the sole basis for treatment or other patient management decisions.  A negative result may occur with improper specimen collection / handling, submission of specimen other than nasopharyngeal swab, presence of viral mutation(s) within the areas targeted by this assay, and inadequate number of viral copies (<250 copies / mL). A negative result must be combined with clinical observations, patient history, and epidemiological information.  Fact Sheet for Patients:   BoilerBrush.com.cy  Fact Sheet for Healthcare Providers: https://pope.com/  This test is not yet approved or  cleared by the [place] FDA and has been authorized for detection and/or diagnosis of SARS-CoV-2 by FDA under an Emergency Use Authorization (EUA).  This EUA will remain in effect (meaning this test can be used) for the duration of the COVID-19 declaration under Section 564(b)(1) of the Act, 21 U.S.C. section 360bbb-3(b)(1), unless the authorization is terminated or revoked sooner.  Performed at  [facility], [address] [place], [place] [address]   MRSA PCR Screening     Status: None   Collection Time: 06/08/20  3:25 PM   Specimen: Nasopharyngeal  Result Value Ref Range Status   MRSA by PCR NEGATIVE NEGATIVE Final    Comment:        The GeneXpert MRSA Assay (FDA approved for NASAL specimens only), is one component of a comprehensive MRSA colonization surveillance program. It is not intended to diagnose MRSA infection nor to guide or monitor treatment for MRSA infections. Performed at [facility], [address], [place], [place] [address]         Radiology Studies: No results found.      Scheduled Meds: . Chlorhexidine Gluconate Cloth  6 each Topical Daily  . docusate sodium  100 mg Oral BID  . pantoprazole  40 mg Oral Q0600   Continuous Infusions: . heparin 1,850 Units/hr (06/13/20 1823)     LOS: 9 days     Jacquelin Hawking, MD [facility] 06/14/2020, 10:07 AM  If 7PM-7AM, please contact night-coverage [email or web address]

## 2020-06-14 NOTE — TOC Initial Note (Signed)
Transition of Care [facility]) - Initial/Assessment Note    Patient Details  Name: Austin Glover MRN: [id number] Date of Birth: [birth year]  Transition of Care [facility]) CM/SW Contact:    Henlawson Cellar, RN Phone Number: 06/14/2020, 12:34 PM  Clinical Narrative:                 Will set patient up with [facility] for PCP needs and medication management at discharge however will need to confirm with Med Mgmt if Xarelto/Eliquis covered. If not will seek other resources.         Patient Goals and CMS Choice        Expected Discharge Plan and Services                                                Prior Living Arrangements/Services                       Activities of Daily Living Home Assistive Devices/Equipment: None ADL Screening (condition at time of admission) Patient's cognitive ability adequate to safely complete daily activities?: Yes Is the patient deaf or have difficulty hearing?: No Does the patient have difficulty seeing, even when wearing glasses/contacts?: No Does the patient have difficulty concentrating, remembering, or making decisions?: No Patient able to express need for assistance with ADLs?: No Does the patient have difficulty dressing or bathing?: No Independently performs ADLs?: Yes (appropriate for developmental age) Does the patient have difficulty walking or climbing stairs?: No Weakness of Legs: None Weakness of Arms/Hands: None  Permission Sought/Granted                  Emotional Assessment              Admission diagnosis:  Critical lower limb ischemia [I99.8] Ischemia of extremity [I99.8] Leg pain [M79.606] Patient Active Problem List   Diagnosis Date Noted  . AKI (acute kidney injury) (HCC) 06/09/2020  . Leg DVT (deep venous thromboembolism), acute, bilateral (HCC) 06/09/2020  . 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 Hydronephrosis of right kidney 06/09/2020  . Leg pain 06/05/2020   PCP:  Patient, No  Pcp Per Pharmacy:  No Pharmacies Listed    Social Determinants of Health (SDOH) Interventions    Readmission Risk Interventions No flowsheet data found.

## 2020-06-14 NOTE — Plan of Care (Signed)
  Problem: Education: Goal: Knowledge of General Education information will improve Description: Including pain rating scale, medication(s)/side effects and non-pharmacologic comfort measures Outcome: Progressing   Problem: Clinical Measurements: Goal: Ability to maintain clinical measurements within normal limits will improve Outcome: Progressing Goal: Will remain free from infection Outcome: Progressing Goal: Diagnostic test results will improve Outcome: Progressing Goal: Respiratory complications will improve Outcome: Progressing Goal: Cardiovascular complication will be avoided Outcome: Progressing   Problem: Activity: Goal: Risk for activity intolerance will decrease Outcome: Progressing   Problem: Nutrition: Goal: Adequate nutrition will be maintained Outcome: Progressing   Problem: Pain Managment: Goal: General experience of comfort will improve Outcome: Progressing   Problem: Safety: Goal: Ability to remain free from injury will improve Outcome: Progressing   Problem: Skin Integrity: Goal: Risk for impaired skin integrity will decrease Outcome: Progressing   

## 2020-06-14 NOTE — Plan of Care (Signed)
  Problem: Education: Goal: Knowledge of General Education information will improve Description Including pain rating scale, medication(s)/side effects and non-pharmacologic comfort measures Outcome: Progressing   

## 2020-06-15 LAB — MULTIPLE MYELOMA PANEL, SERUM
Albumin SerPl Elph-Mcnc: 2.5 g/dL — ABNORMAL LOW (ref 2.9–4.4)
Albumin/Glob SerPl: 0.9 (ref 0.7–1.7)
Alpha 1: 0.5 g/dL — ABNORMAL HIGH (ref 0.0–0.4)
Alpha2 Glob SerPl Elph-Mcnc: 0.7 g/dL (ref 0.4–1.0)
B-Globulin SerPl Elph-Mcnc: 0.9 g/dL (ref 0.7–1.3)
Gamma Glob SerPl Elph-Mcnc: 0.7 g/dL (ref 0.4–1.8)
Globulin, Total: 2.8 g/dL (ref 2.2–3.9)
IgA: 133 mg/dL (ref 90–386)
IgG (Immunoglobin G), Serum: 740 mg/dL (ref 603–1613)
IgM (Immunoglobulin M), Srm: 56 mg/dL (ref 20–172)
Total Protein ELP: 5.3 g/dL — ABNORMAL LOW (ref 6.0–8.5)

## 2020-06-15 LAB — CBC
HCT: 32.1 % — ABNORMAL LOW (ref 39.0–52.0)
Hemoglobin: 10.6 g/dL — ABNORMAL LOW (ref 13.0–17.0)
MCH: 29.4 pg (ref 26.0–34.0)
MCHC: 33 g/dL (ref 30.0–36.0)
MCV: 88.9 fL (ref 80.0–100.0)
Platelets: 260 10*3/uL (ref 150–400)
RBC: 3.61 MIL/uL — ABNORMAL LOW (ref 4.22–5.81)
RDW: 11.7 % (ref 11.5–15.5)
WBC: 7.8 10*3/uL (ref 4.0–10.5)
nRBC: 0 % (ref 0.0–0.2)

## 2020-06-15 LAB — GLUCOSE, CAPILLARY
Glucose-Capillary: 92 mg/dL (ref 70–99)
Glucose-Capillary: 88 mg/dL (ref 70–99)
Glucose-Capillary: 98 mg/dL (ref 70–99)
Glucose-Capillary: 102 mg/dL — ABNORMAL HIGH (ref 70–99)

## 2020-06-15 LAB — HEPARIN LEVEL (UNFRACTIONATED): Heparin Unfractionated: 0.55 IU/mL (ref 0.30–0.70)

## 2020-06-15 MED ORDER — ENSURE MAX PROTEIN PO LIQD
11.0000 [oz_av] | Freq: Two times a day (BID) | ORAL | Status: DC
  Administered 2020-06-15 – 2020-06-16 (×2): 11 [oz_av] via ORAL
  Filled 2020-06-15: qty 330

## 2020-06-15 NOTE — TOC Progression Note (Signed)
Transition of Care [facility]) - Progression Note    Patient Details  Name: Austin Glover MRN: [id number] Date of Birth: 04-29-1981  Transition of Care [facility]) CM/SW Contact  Barrie Dunker, RN Phone Number: 06/15/2020, 12:23 PM  Clinical Narrative:   Verified with [facility] that they do have Eliquis and the patient will be able to get it from them,         Expected Discharge Plan and Services                                                 Social Determinants of Health (SDOH) Interventions    Readmission Risk Interventions No flowsheet data found.

## 2020-06-15 NOTE — Progress Notes (Signed)
PROGRESS NOTE    Austin Glover  DGU:[id number] DOB: 11/26/1980 DOA: 06/04/2020 PCP: Patient, No Pcp Per   Brief Narrative: Austin Glover is a 39 y.o. male with no medical history presented secondary to leg pain and found to have significant LE VTE bilateral extending up to IVC in setting of retroperitoneal fibrosis.    Assessment & Plan:   Principal Problem:   Leg DVT (deep venous thromboembolism), acute, bilateral (HCC) Active Problems:   Leg pain   AKI (acute kidney injury) (HCC)   RPF (retroperitoneal fibrosis)   Hydronephrosis of right kidney  Acute bilateral DVT Patient is s/p mechanical thrombectomy/thrombolysis with angioplasty of common iliac, external iliac, common femoral, superficial femoral veins bilaterally on 7/19 and recurrent thrombectomy including the IVC, bilateral common femoral, external/common iliac veins; stent placed to bilateral iliac veins and inferior vena cava. Procoagulation workup is negative so far although ANA is positive; Factor V leiden, prothrombin gen mutation, lupus anticoagulation pending. Kappa free light chains are elevated and SPEP without evidence of monoclonal protein. UPEP unremarkable. Ambulated well with PT -Continue heparin drip, transition to Eliquis depending on disposition  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 Possibly idiopathic but not definitive per imaging. Discussed with on-call rheumatology on 7/22 who recommended not to empirically treat for primary RPF without first obtaining biopsy as diagnosis is extremely rare (in this area 1 every 10 years in his experience). [facility] and [facility] contacted on 7/22 for transfer but declined. General surgery declines to perform biopsy. IR previously consulted and unable to perform biopsy. Patient may end up needing to have this managed after discharge from this hospitalization. Unable to safely attempt biopsy in this hospital. Discussed case with  [facility] on 7/24 who will review with specialist services and decide on possible transfer -Called [facility] back today and awaiting call back to discuss transfer  Positive ANA/dsDNA Patient without significant history correlating with lupus. He did have transient thrombocytopenia in setting of acute DVTs. He reports some photosensitivity but says only when he is on the beach and has no problems in other settings. -Will discuss positive dsDNA with rheumatology  AKI No baseline. Initial creatinine of 1.89 which has now improved. In setting of hydronephrosis but secondary to chronic nature, unlikely etiology. Possibly prerenal in etiology. Resolved.  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 Does not appear right kidney is likely functioning. Urology consulted and no utility in stent placement secondary so kidney parenchymal loss/atrophy. Nephrology consulted with no new recommendations.  Thrombocytopenia In setting of heparin drip. 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 Heparin discontinued and patient started on argatroban IV. Heparin induced platelet ab wnl so unlikely related to HIT. Heparin restarted on 7/22. Thrombocytopenia resolved. -Daily CBC  Possible limb ischemia Ruled out.   DVT prophylaxis: Heparin IV Code Status:   Code Status: Full Code Family Communication: Wife at bedside Disposition Plan: Med-surg. Attempting transfer to [facility] center. Discharge in 24-48 hours pending transfer attempt   Consultants:   Vascular surgery  General surgery x2  Infectious disease  Medical oncology  Urology  Nephrology  Rheumatology (curbside)  Procedures:   VASCULAR SURGERY OP (06/08/2020) PROCEDURE: 1. US guidance for vascular access to bilateral popliteal vein 2. Catheter placement into bilateral common iliac veins and IVC from bilateral popliteal approach 3. IVC gram and bilateral lower extremity venogram 4.   Catheter directed thrombolysis with 6 mg  of TPA to the common iliac veins, external iliac veins, common femoral veins, and superficial femoral  veins bilaterally 5. Mechanical thrombectomy to the IVC, bilateral common iliac veins, bilateral external iliac veins, bilateral common femoral veins, and bilateral superficial femoral veins with the penumbra CAT 12 device 6. PTA of left common and external iliac veins with 10 mm balloon 7.   PTA of the right common femoral vein, external and common iliac veins with 10 mm balloons 8.   PTA of the IVC with 10 and 14 mm balloons 9.   Placement of catheters for continuous thrombolytic therapy with a 90 cm total length 20 cm working length catheter in the left iliac veins and a 90 cm total length 50 cm working length catheter in the IVC, left common and external iliac veins, and left common and superficial femoral veins  VASCULAR SURGERY OP (06/10/2020) PROCEDURE: 1. Catheter placement into IVC from bilateral popliteal approach 2. IVC gram and bilateral lower extremity venogram 3. Mechanical thrombectomy to bilateral common femoral veins, external and common iliac veins, and IVC with the penumbra CAT 12 device 4.   Kissing balloon stent placements to bilateral common iliac veins and inferior vena cava with 14 mm diameter by 8 cm length stents postdilated with a 12 mm balloons proximally and 14 mm balloon distally  Antimicrobials:  Vancomycin  Flagyl  Cefpime  Cefazolin    Subjective: No issues overnight  Interpreter: Daniel # [id number]  Objective: Vitals:   06/14/20 0747 06/14/20 1811 06/14/20 2359 06/15/20 0723  BP: 125/83 (!) 132/77 113/82 120/84  Pulse: 86 97 83 81  Resp: 15  20 16   Temp: 98.9 F (37.2 C) 98.4 F (36.9 C) 98.6 F (37 C) 98.7 F (37.1 C)  TempSrc: Oral Oral Oral Oral  SpO2: 96% 99% 96% 96%  Weight:      Height:        Intake/Output Summary (Last 24 hours) at 06/15/2020 1224 Last data filed at 06/15/2020 0559 Gross per 24 hour  Intake 0 ml  Output  550 ml  Net -550 ml   Filed Weights   06/11/20 0500 06/12/20 0500 06/13/20 1800  Weight: 114 kg (!) 109.5 kg (!) 102.4 kg    Examination:  General exam: Appears calm and comfortable Respiratory system: Clear to auscultation. Respiratory effort normal. Cardiovascular system: S1 & S2 heard, RRR. No murmurs, rubs, gallops or clicks. Gastrointestinal system: Abdomen is nondistended, soft and nontender. No organomegaly or masses felt. Normal bowel sounds heard. Central nervous system: Alert and oriented. No focal neurological deficits. Musculoskeletal: No edema. No calf tenderness Skin: No cyanosis. No rashes Psychiatry: Judgement and insight appear normal. Mood & affect appropriate.        Data Reviewed: I have personally reviewed following labs and imaging studies  CBC Lab Results  Component Value Date   WBC 7.8 06/15/2020   RBC 3.61 (L) 06/15/2020   HGB 10.6 (L) 06/15/2020   HCT 32.1 (L) 06/15/2020   MCV 88.9 06/15/2020   MCH 29.4 06/15/2020   PLT 260 06/15/2020   MCHC 33.0 06/15/2020   RDW 11.7 06/15/2020   LYMPHSABS 1.6 06/06/2020   MONOABS 1.0 06/06/2020   EOSABS 0.1 06/06/2020   BASOSABS 0.0 06/06/2020     Last metabolic panel Lab Results  Component Value Date   NA 139 06/11/2020   K 3.8 06/11/2020   CL 107 06/11/2020   CO2 25 06/11/2020   BUN 10 06/11/2020   CREATININE 1.12 06/11/2020   GLUCOSE 104 (H) 06/11/2020   GFRNONAA >60 06/11/2020   GFRAA >60 06/11/2020   CALCIUM 7.7 (L)  06/11/2020   PROT 6.3 (L) 06/06/2020   ALBUMIN 3.4 (L) 06/06/2020   LABGLOB 2.7 06/09/2020   AGRATIO 1.0 06/09/2020   BILITOT 1.1 06/06/2020   ALKPHOS 61 06/06/2020   AST 32 06/06/2020   ALT 24 06/06/2020   ANIONGAP 7 06/11/2020    CBG (last 3)  Recent Labs    06/14/20 2202 06/15/20 0725 06/15/20 1138  GLUCAP 90 92 88     GFR: Estimated Creatinine Clearance: 110 mL/min (by C-G formula based on SCr of 1.12 mg/dL).  Coagulation Profile: No results for input(s):  INR, PROTIME in the last 168 hours.  Recent Results (from the past 240 hour(s))  MRSA PCR Screening     Status: None   Collection Time: 06/08/20  3:25 PM   Specimen: Nasopharyngeal  Result Value Ref Range Status   MRSA by PCR NEGATIVE NEGATIVE Final    Comment:        The GeneXpert MRSA Assay (FDA approved for NASAL specimens only), is one component of a comprehensive MRSA colonization surveillance program. It is not intended to diagnose MRSA infection nor to guide or monitor treatment for MRSA infections. Performed at [facility], [address] [place], [place] [address]         Radiology Studies: No results found.      Scheduled Meds: . Chlorhexidine Gluconate Cloth  6 each Topical Daily  . docusate sodium  100 mg Oral BID  . pantoprazole  40 mg Oral Q0600   Continuous Infusions: . heparin 1,850 Units/hr (06/15/20 1045)     LOS: 10 days     Jacquelin Hawking, MD [facility] 06/15/2020, 12:24 PM  If 7PM-7AM, please contact night-coverage [email or web address]

## 2020-06-15 NOTE — Progress Notes (Signed)
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    Estimated Creatinine Clearance: 110 mL/min (by C-G formula based on SCr of 1.12 mg/dL).  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 Patient is s/p thrombectomy. The patient had a decrease in platelets following Heparin infusion, HIT ordered. Patient was switched to argatroban. HIT negative 7/17. Patient to switch back to heparin infusion. Argatroban stopped approximately 0830 07/22, heparin infusion was started at approximately 1030am 07/22  0723 0403 HL 0.54, therapeutic x 1, CBC stable 0723 1015 HL 0.61, therapeutic x 2 0724 0448 HL 0.64, therapeutic x 3 0725 0443 HL 0.66, therapeutic x 4.  CBC stable 0726 0500 HL 0.55, therapeutic x 5.  CBC stable.    Goal of Therapy:  HL 0.3 - 0.7  Monitor platelets by anticoagulation protocol: Yes   Plan:  HL therapeutic x 5. Continue heparin drip at 1850 units/hr. HL and CBC with morning labs.  8/22, PharmD Clinical Pharmacist 06/15/2020 6:24 AM

## 2020-06-15 NOTE — Progress Notes (Signed)
Initial Nutrition Assessment  DOCUMENTATION CODES:   Not applicable  INTERVENTION:   Ensure Max protein supplement BID, each supplement provides 150kcal and 30g of protein.  NUTRITION DIAGNOSIS:   Inadequate oral intake related to acute illness as evidenced by per patient/family report.  GOAL:   Patient will meet greater than or equal to 90% of their needs  MONITOR:   PO intake, Supplement acceptance, Labs, Skin, I & O's, Weight trends  REASON FOR ASSESSMENT:   Malnutrition Screening Tool    ASSESSMENT:   39 y.o. male admitted to [facility] due to bilateral PE and multiple DVTs now s/p pulmonary thrombectomies and vena cava and bilateral iliac vein thrombectomy and stent placement 7/21   Met with pt in room today. Pt reports decreased appetite and oral intake during the beginning of his admit but reports that his appetite is improving. Pt is not eating much hospital food but reports that he is eating well foods brought from home. Pt did not touch his lunch tray today but reports that his mom is bringing him food later. RD discussed with pt the importance of adequate protein needed to preserve lean muscle. Pt is willing to drink Ensure supplements. Per chart, pt down 28lbs since admit but reports that he is back to his UBW of ~230lbs.   Medications reviewed and include: colace, protonix, heparin  Labs reviewed:   NUTRITION - FOCUSED PHYSICAL EXAM:    Most Recent Value  Orbital Region No depletion  Upper Arm Region No depletion  Thoracic and Lumbar Region No depletion  Buccal Region No depletion  Temple Region No depletion  Clavicle Bone Region No depletion  Clavicle and Acromion Bone Region No depletion  Scapular Bone Region No depletion  Dorsal Hand No depletion  Patellar Region No depletion  Anterior Thigh Region No depletion  Posterior Calf Region No depletion  Edema (RD Assessment) None  Hair Reviewed  Eyes Reviewed  Mouth Reviewed  Skin Reviewed  Nails Reviewed      Diet Order:   Diet Order            Diet regular Room service appropriate? Yes; Fluid consistency: Thin  Diet effective now                EDUCATION NEEDS:   Education needs have been addressed  Skin:  Skin Assessment: Reviewed RN Assessment (ecchymosis)  Last BM:  7/23  Height:   Ht Readings from Last 1 Encounters:  06/13/20 _0  (1.803 m)    Weight:   Wt Readings from Last 1 Encounters:  06/13/20 (!) 102.4 kg    Ideal Body Weight:  78.1 kg  BMI:  Body mass index is 31.48 kg/m.  Estimated Nutritional Needs:   Kcal:  2300-2600kcal/day  Protein:  115-130g/day  Fluid:  >2.3L/day  Koleen Distance MS, RD, LDN Please refer to [facility] for RD and/or RD on-call/weekend/after hours pager

## 2020-06-15 NOTE — TOC Progression Note (Addendum)
Transition of Care [facility]) - Progression Note    Patient Details  Name: Austin Glover MRN: [id number] Date of Birth: 05/04/1981  Transition of Care [facility]) CM/SW [place], RN Phone Number: 06/15/2020, 1:08 PM  Clinical Narrative:   Met with the patient and his family at the bedside, he does not have a PCP, he has no insurance, I provided him with the [facility] application in [place], I explained he needed to call the number to get an appointment for an application to be filled out,  I explained the same information will be used for [facility].  I emailed a referral to [facility], He plans to obtain his medication from [facility] at [place].  He states that he does not have additional needs         Expected Discharge Plan and Services                                                 Social Determinants of Health (SDOH) Interventions    Readmission Risk Interventions No flowsheet data found.

## 2020-06-16 LAB — CBC
HCT: 32.5 % — ABNORMAL LOW (ref 39.0–52.0)
Hemoglobin: 11.2 g/dL — ABNORMAL LOW (ref 13.0–17.0)
MCH: 29.8 pg (ref 26.0–34.0)
MCHC: 34.5 g/dL (ref 30.0–36.0)
MCV: 86.4 fL (ref 80.0–100.0)
Platelets: 286 10*3/uL (ref 150–400)
RBC: 3.76 MIL/uL — ABNORMAL LOW (ref 4.22–5.81)
RDW: 11.9 % (ref 11.5–15.5)
WBC: 8.7 10*3/uL (ref 4.0–10.5)
nRBC: 0.2 % (ref 0.0–0.2)

## 2020-06-16 LAB — CREATININE, SERUM
Creatinine, Ser: 1.23 mg/dL (ref 0.61–1.24)
GFR calc Af Amer: >60 mL/min (ref >60)
GFR calc non Af Amer: >60 mL/min (ref >60)

## 2020-06-16 LAB — FACTOR 5 LEIDEN

## 2020-06-16 LAB — HEPARIN LEVEL (UNFRACTIONATED): Heparin Unfractionated: 0.92 IU/mL — ABNORMAL HIGH (ref 0.30–0.70)

## 2020-06-16 LAB — GLUCOSE, CAPILLARY
Glucose-Capillary: 97 mg/dL (ref 70–99)
Glucose-Capillary: 111 mg/dL — ABNORMAL HIGH (ref 70–99)

## 2020-06-16 MED ORDER — APIXABAN (ELIQUIS) VTE STARTER PACK (10MG AND 5MG)
ORAL_TABLET | ORAL | 0 refills | Status: DC
Start: 2020-06-16 — End: 2021-06-25

## 2020-06-16 MED ORDER — PREDNISONE 50 MG PO TABS: 60.0000 mg | ORAL_TABLET | Freq: Every day | ORAL | Status: DC

## 2020-06-16 MED ORDER — PREDNISONE 20 MG PO TABS: 60.0000 mg | ORAL_TABLET | Freq: Every day | ORAL | 0 refills | Status: AC

## 2020-06-16 MED ORDER — APIXABAN 5 MG PO TABS
10.0000 mg | ORAL_TABLET | Freq: Two times a day (BID) | ORAL | Status: DC
  Administered 2020-06-16: 10 mg via ORAL
  Filled 2020-06-16: qty 2

## 2020-06-16 MED ORDER — APIXABAN 5 MG PO TABS: 5.0000 mg | ORAL_TABLET | Freq: Two times a day (BID) | ORAL | Status: DC

## 2020-06-16 NOTE — Discharge Instructions (Signed)
Austin Glover,  You were in the hospital because of blood clots in the veins in your leg and abdomen. This was treated by the vascular surgeon. 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 The Rheumatologist has recommended Prednisone since we were not successful in arranging a biopsy for you. Please follow-up with the Rheumatologist, Vascular surgeon and nephrologist.

## 2020-06-16 NOTE — Discharge Summary (Signed)
Physician Discharge Summary  Austin Glover ZOX:[id number] DOB: 12-03-1980 DOA: 06/04/2020  PCP: Patient, No Pcp Per  Admit date: 06/04/2020 Discharge date: 06/16/2020  Admitted From: Home Disposition: Home  Recommendations for Outpatient Follow-up:  1. Follow up with PCP in 1 week 2. Follow up with rheumatology 3. Please follow up on the following pending results: None  Home Health: None Equipment/Devices: None  Discharge Condition: Stable CODE STATUS: Full code Diet recommendation: Regular diet   Brief/Interim Summary:  Austin Glover is a 39 y.o. male with no medical history presented secondary to leg pain and found to have significant LE VTE bilateral extending up to IVC in setting of retroperitoneal fibrosis. Patient underwent multiple thrombectomies in addition to tPA and stent placement. Patient transition to Eliquis for DVT treatment and started on steroids for empiric treatment of RPF.  Hospital course:  Acute bilateral DVT Patient is s/p mechanical thrombectomy/thrombolysis with angioplasty of common iliac, external iliac, common femoral, superficial femoral veins bilaterally on 7/19 and recurrent thrombectomy including the IVC, bilateral common femoral, external/common iliac veins; stent placed to bilateral iliac veins and inferior vena cava. Procoagulation workup is negative so far although ANA is positive; Factor V leiden, prothrombin gen mutation, lupus anticoagulation pending. Kappa free light chains are elevated and SPEP without evidence of monoclonal protein. UPEP unremarkable. Ambulated well with PT. Patient transitioned to Eliquis prior to discharge.  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 Possibly idiopathic but not definitive per imaging. Discussed with on-call rheumatology on 7/22 who recommended not to empirically treat for primary RPF without first obtaining biopsy as diagnosis is extremely rare (in this area 1 every 10  years in his experience). [facility] and [facility] contacted on 7/22 for transfer but declined. General surgery declines to perform biopsy. IR previously consulted and unable to perform biopsy. Patient may end up needing to have this managed after discharge from this hospitalization. Unable to safely attempt biopsy in this hospital. Discussed case with [facility] on 7/24 who reviewed with specialist services and ultimately declined transfer for biopsy as they did not think they would be able to perform a successful biopsy. Discussed again with rheumatology who recommended starting prednisone 60 mg daily since unable to obtain biopsy. Outpatient rheumatology follow-up.  Positive ANA/dsDNA Patient without significant history correlating with lupus. He did have transient thrombocytopenia in setting of acute DVTs. He reports some photosensitivity but says only when he is on the beach and has no problems in other settings. Discussed with rheumatology and unlikely significant. Outpatient follow-up.  AKI No baseline. Initial creatinine of 1.89 which has now improved. In setting of hydronephrosis but secondary to chronic nature, unlikely etiology. Possibly prerenal in etiology. Resolved.  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 Does not appear right kidney is likely functioning. Urology consulted and no utility in stent placement secondary so kidney parenchymal loss/atrophy. Nephrology consulted with no new recommendations.  Thrombocytopenia In setting of heparin drip. 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 Heparin discontinued and patient started on argatroban IV. Heparin induced platelet ab wnl so unlikely related to HIT. Heparin restarted on 7/22. Thrombocytopenia resolved.  Possible limb ischemia Ruled out.  Discharge Diagnoses:  Principal Problem:   Leg DVT (deep venous thromboembolism), acute, bilateral (HCC) Active Problems:   Leg pain   AKI (acute kidney injury)  (HCC)   RPF (retroperitoneal fibrosis)   Hydronephrosis of right kidney    Discharge Instructions  Discharge Instructions    Call MD for:  redness,  tenderness, or signs of infection (pain, swelling, redness, odor or green/yellow discharge around incision site)   Complete by: As directed    Increase activity slowly   Complete by: As directed      Allergies as of 06/16/2020   No Known Allergies     Medication List    TAKE these medications   Apixaban Starter Pack (  and ) Commonly known as: ELIQUIS STARTER PACK Take as directed on package: start with two-5mg  tablets twice daily for 7 days. On day 8, switch to one-5mg  tablet twice daily.   predniSONE 20 MG tablet Commonly known as: DELTASONE Take 3 tablets (60 mg total) by mouth daily with breakfast. Start taking on: June 17, 2020       Follow-up Information    Dew, Marlow Baars, MD Follow up in 2 week(s).   Specialties: Vascular Surgery, Radiology, Interventional Cardiology Why: To see Dew. Will need IVC and bilateral lower extremity DVT study. Contact information: [address] [place] [address] [phone]        Kandyce Rud., MD Follow up.   Specialty: Rheumatology Why: Retroperitoneal fibrosis Contact information: [address] [facility] [place] [address] [phone]        Lamont Dowdy, MD Follow up.   Specialty: Nephrology Why: Solitary kidney Contact information: [address] [place] [address] [phone]              No Known Allergies  Consultations:  Vascular surgery  Infectious disease  General surgery  Nephrology  Medical oncology  Rheumatology  [facility]  [facility] [place]  [facility]   Procedures/Studies: [place] Chest 2 View  Result Date: 06/06/2020 CLINICAL DATA:  Bilateral leg pain EXAM: CHEST - 2 VIEW COMPARISON:  None. FINDINGS: The heart size and mediastinal contours  are within normal limits. Both lungs are clear. The visualized skeletal structures are unremarkable. IMPRESSION: No active cardiopulmonary disease. Electronically Signed   By: Jonna Clark M.D.   On: 06/06/2020 15:59   CT ANGIO CHEST PE W OR WO CONTRAST  Result Date: 06/07/2020 CLINICAL DATA:  Known bilateral DVT's.  Question pulmonary embolus. EXAM: CT ANGIOGRAPHY CHEST WITH CONTRAST TECHNIQUE: Multidetector CT imaging of the chest was performed using the standard protocol during bolus administration of intravenous contrast. Multiplanar CT image reconstructions and MIPs were obtained to evaluate the vascular anatomy. CONTRAST:  75 mL OMNIPAQUE IOHEXOL 350 MG/ML SOLN COMPARISON:  None. FINDINGS: Cardiovascular: Satisfactory opacification of the pulmonary arteries to the segmental level. No evidence of pulmonary embolism. Normal heart size. No pericardial effusion. Mediastinum/Nodes: No enlarged mediastinal, hilar, or axillary lymph nodes. Thyroid gland, trachea, and esophagus demonstrate no significant findings. Lungs/Pleura: No pleural effusion.  Mild dependent atelectasis. Upper Abdomen: Negative. Musculoskeletal: Negative. Review of the MIP images confirms the above findings. IMPRESSION: Negative for pulmonary embolus.  Negative chest CT. Electronically Signed   By: Drusilla Kanner M.D.   On: 06/07/2020 14:51   MR ABDOMEN W WO CONTRAST  Result Date: 06/07/2020 CLINICAL DATA:  39 year old male with a history of abdominal mass, new lower extremity swelling EXAM: MRI ABDOMEN WITHOUT AND WITH CONTRAST TECHNIQUE: Multiplanar multisequence MR imaging of the abdomen was performed both before and after the administration of intravenous contrast. CONTRAST:  10mL GADAVIST GADOBUTROL 1 MMOL/ML IV SOLN COMPARISON:  CT angiogram 06/04/2020, prior duplex 06/06/2020 FINDINGS: Lower chest: Unremarkable Hepatobiliary: Uniform signal throughout the visualized liver. Small hypointense focus on the anti dependent  gallbladder wall  within the fundus, not visualized on prior CT. This measures 4 mm. No inflammatory changes. Pancreas:  Unremarkable Spleen:  Unremarkable Adrenals/Urinary Tract: - Right adrenal gland:  Unremarkable - Left adrenal gland: Unremarkable. - Right kidney: Advanced hydronephrosis/pelvicaliectasis of the right kidney, with complete atrophy of right renal parenchyma. Dilated and tortuous proximal right ureter which terminates at the adjacent to the right iliac vasculature/IVC, similar to prior CT. No inflammatory changes adjacent to the right collecting system. No focal lesion. - Left Kidney: No hydronephrosis, nephrolithiasis, inflammation, or ureteral dilation. No focal lesion. Stomach/Bowel: Visualized portions within the abdomen are unremarkable. Vascular: Unremarkable course caliber and contour of the visualized aorta. Unremarkable course caliber and contour of the visualized iliac arteries. Celiac artery, superior mesenteric artery, inferior mesenteric artery remain patent. Single left renal artery which remains patent. Small right renal artery. Portal vasculature unremarkable. Hepatic venous vasculature unremarkable. Loss of the normal flow signal of the bilateral external iliac veins, bilateral common iliac veins. The flow signal absence extends to the confluence of the bilateral iliac veins, or a small caliber IVC is identified. As was seen on the comparison CT, the infrarenal IVC is small caliber. IVC more normal diameter at the level of the left renal vein inflow, 22 mm. Loss of flow signal into enlarged left lumbar collateral venous drainage, which was present on the prior CT. No significant enlarged superficial body wall collaterals. Other:  No adenopathy. Hypointense signal adjacent to the lower IVC and in the distribution of the lymphatics of the right common iliac pathway, corresponding to the multifocal calcium on the prior CT. Musculoskeletal: Unremarkable signal of the visualized skeletal  elements. IMPRESSION: The MRI confirms complete bilateral common iliac vein and external iliac vein thrombosis/DVT. There is also at least partial thrombosis of the internal iliac veins as well as the primary collateral venous drainage along the left lumbar drainage pathway, in the setting of chronic infrarenal IVC stenosis/occlusion. 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 As was previously suggested on CT and the MRI preliminary, the leading differential diagnosis of the etiology is retroperitoneal fibrosis, either idiopathic or related to mycobacterial infection. Given the absence of focal soft tissue lesion, atypical carcinoid or other mesenchymal tumor is not favored. Complete atrophy of the right kidney parenchyma with advanced hydronephrosis/pelvicaliectasis and dilation of the proximal ureter secondary to ureteral occlusion. Signed, Yvone Neu. Reyne Dumas, RPVI [facility] Radiology Electronically Signed   By: Gilmer Mor D.O.   On: 06/07/2020 13:40   CT Angio Aortobifemoral W and/or Wo Contrast  Result Date: 06/04/2020 CLINICAL DATA:  Acute back pain, bilateral leg pain, mottled lower extremity EXAM: CT ANGIOGRAPHY OF ABDOMINAL AORTA WITH ILIOFEMORAL RUNOFF TECHNIQUE: Multidetector CT imaging of the abdomen, pelvis and lower extremities was performed using the standard protocol during bolus administration of intravenous contrast. Multiplanar CT image reconstructions and MIPs were obtained to evaluate the vascular anatomy. CONTRAST:  OMNIPAQUE IOHEXOL 350 MG/ML SOLN COMPARISON:  None. FINDINGS: VASCULAR Aorta: Normal caliber aorta without aneurysm, dissection, vasculitis or significant stenosis. Celiac: Patent without evidence of aneurysm, dissection, vasculitis or significant stenosis. SMA: Patent without evidence of aneurysm,  dissection, vasculitis or significant stenosis. Renals: Single left renal artery, unremarkable. Single right renal artery, diffusely diminutive in size. IMA: Patent without evidence of aneurysm, dissection, vasculitis or significant stenosis. RIGHT Lower Extremity Inflow: Common, internal and external iliac arteries are patent without evidence of aneurysm,  dissection, vasculitis or significant stenosis. Outflow: Common, superficial and profunda femoral arteries and the proximal popliteal artery are patent without evidence of aneurysm, dissection, vasculitis or significant stenosis. There is asymmetric SFA opacification, left greater than right. There is incomplete opacification of the distal right popliteal artery. Runoff: No significant contrast opacification. LEFT Lower Extremity Inflow: Common, internal and external iliac arteries are patent without evidence of aneurysm, dissection, vasculitis or significant stenosis. Outflow: Common, superficial and profunda femoral arteries and the popliteal artery are patent without evidence of aneurysm, dissection, vasculitis or significant stenosis. Runoff: Patent 3 vessel runoff to the mid calf, incomplete opacification distally presumably related to scan timing. Veins: Dilated right left common iliac vein and iliocaval confluence up to 3.2 cm diameter, with tapered narrowing of the IVC to 1.4 cm in its infrarenal segment. Due to arterial scan timing, no venous opacification to assess for thrombus. Review of the MIP images confirms the above findings. NON-VASCULAR Lower chest: No pleural or pericardial effusion. Visualized lung bases clear. Hepatobiliary: No focal liver abnormality is seen. No gallstones, gallbladder wall thickening, or biliary dilatation. Pancreas: Unremarkable. No pancreatic ductal dilatation or surrounding inflammatory changes. Spleen: Normal in size.  Single small calcified granuloma. Adrenals/Urinary Tract: Adrenal glands unremarkable. Left kidney  normal. There is massive right hydronephrosis and proximal ureterectasis with advanced atrophy of the renal parenchyma, only a thin rim of tissue evident. The right ureterectasis terminates at the level of a right retroperitoneal soft tissue mass containing coarse calcifications process abutting the right common iliac vein and distal IVC. Distal right ureter decompressed. Urinary bladder nondistended, mildly thick-walled. Thick-walled urachal remnant with soft tissue nodularity containing 2 foci of calcification. Stomach/Bowel: Stomach is partially distended by ingested fluid. The small bowel is nondistended. normal appendix. The colon is decompressed, unremarkable. Lymphatic: Multiple prominent subcentimeter right external iliac, bilateral common iliac, left para-aortic and aortocaval lymph nodes. No mesenteric adenopathy. Reproductive: Prostate is unremarkable. Other: No ascites.  No free air. Musculoskeletal: Soft tissue mass containing coarse calcifications extending over a craniocaudal length of 7.2 cm, abutting right common iliac vein and ilio caval confluence, in corresponding to transition point of proximal right ureterectasis. Left knee medial compartment DJD. No fracture or worrisome bone lesion. IMPRESSION: 1. No aortic dissection, occlusion or stenosis. 2. Asymmetric flow to lower extremities left greater than right, limiting evaluation of distal right popliteal artery and runoff such that acute embolus cannot be confidently excluded. 3. Dilated left common iliac vein and iliocaval confluence up to 3.2 cm diameter, with tapered narrowing of the IVC to 1.4 cm in its infrarenal segment. Given the clinical history, bilateral lower extremity venous ultrasound may be useful for further evaluation. 4. Right retroperitoneal soft tissue mass containing coarse calcifications, abutting the right common iliac vein and distal IVC. There is associated chronic obstructive right hydronephrosis and proximal  ureterectasis with advanced right renal atrophy, suggesting chronic process. MR may be useful for better soft tissue characterization. 5. Thick-walled urachal remnant with soft tissue nodularity containing 2 foci of calcification. Recommend urologic consultation. Electronically Signed   By: Corlis Leak M.D.   On: 06/04/2020 10:35   PERIPHERAL VASCULAR CATHETERIZATION  Result Date: 06/10/2020 See op note  PERIPHERAL VASCULAR CATHETERIZATION  Result Date: 06/08/2020 See op note  PERIPHERAL VASCULAR CATHETERIZATION  Result Date: 06/04/2020 See op note  US Venous Img Lower Bilateral (DVT)  Result Date: 06/06/2020 CLINICAL DATA:  Left calf tenderness for 4 days. EXAM: BILATERAL LOWER EXTREMITY VENOUS DOPPLER ULTRASOUND TECHNIQUE: Gray-scale sonography with graded compression,  as well as color Doppler and duplex ultrasound were performed to evaluate the lower extremity deep venous systems from the level of the common femoral vein and including the common femoral, femoral, profunda femoral, popliteal and calf veins including the posterior tibial, peroneal and gastrocnemius veins when visible. The superficial great saphenous vein was also interrogated. Spectral Doppler was utilized to evaluate flow at rest and with distal augmentation maneuvers in the common femoral, femoral and popliteal veins. COMPARISON:  None. FINDINGS: RIGHT LOWER EXTREMITY There is occlusive thrombus involving the right common femoral vein, saphenofemoral junction, profundus femoral vein, femoral vein throughout its course, popliteal vein, as well as visualized calf veins. There is lack of normal compressibility and response to augmentation. LEFT LOWER EXTREMITY Similar to the right there is occlusive thrombus involving the right common femoral vein, saphenofemoral junction, profundus femoral vein, femoral vein throughout its course, popliteal vein, as well as visualized calf veins. There is lack of normal compressibility and response  to augmentation. IMPRESSION: Bilateral occlusive deep vein thrombus extending from the common femoral veins through the visualized calf veins. These results will be called to the ordering clinician or representative by the Radiologist Assistant, and communication documented in the PACS or [employer]. Electronically Signed   By: Emmaline Kluver M.D.   On: 06/06/2020 14:21   DG C-Arm 1-60 Min-No Report  Result Date: 06/04/2020 Fluoroscopy was utilized by the requesting physician.  No radiographic interpretation.   ECHOCARDIOGRAM COMPLETE  Result Date: 06/05/2020    ECHOCARDIOGRAM REPORT   Patient Name:   Deetta Perla Date of Exam: 06/05/2020 Medical Rec #:  [id number]    Height:       71.0 in Accession #:    [id number]   Weight:       230.0 lb Date of Birth:  1981-10-02    BSA:          2.238 m Patient Age:    39 years     BP:           134/95 mmHg Patient Gender: M            HR:           110 bpm. Exam Location:  [facility] Procedure: 2D Echo, Cardiac Doppler and Color Doppler Indications:     CHF- acute systolic 428.21  History:         Patient has no prior history of Echocardiogram examinations. No                  medical history on file.  Sonographer:     Cristela Blue RDCS (AE) Referring Phys:  [id number] Tonette Lederer Diagnosing Phys: Julien Nordmann MD IMPRESSIONS  1. Left ventricular ejection fraction, by estimation, is 60 to 65%. The left ventricle has normal function. The left ventricle has no regional wall motion abnormalities. There is mild left ventricular hypertrophy. Left ventricular diastolic parameters are consistent with Grade I diastolic dysfunction (impaired relaxation).  2. Right ventricular systolic function is normal. The right ventricular size is normal. FINDINGS  Left Ventricle: Left ventricular ejection fraction, by estimation, is 60 to 65%. The left ventricle has normal function. The left ventricle has no regional wall motion abnormalities. The left ventricular internal cavity size  was normal in size. There is  mild left ventricular hypertrophy. Left ventricular diastolic parameters are consistent with Grade I diastolic dysfunction (impaired relaxation). Right Ventricle: The right ventricular size is normal. No increase in right ventricular wall thickness. Right ventricular systolic function is  normal. Left Atrium: Left atrial size was normal in size. Right Atrium: Right atrial size was normal in size. Pericardium: There is no evidence of pericardial effusion. Mitral Valve: The mitral valve is normal in structure. Normal mobility of the mitral valve leaflets. No evidence of mitral valve regurgitation. No evidence of mitral valve stenosis. Tricuspid Valve: The tricuspid valve is normal in structure. Tricuspid valve regurgitation is not demonstrated. No evidence of tricuspid stenosis. Aortic Valve: The aortic valve is normal in structure. Aortic valve regurgitation is not visualized. No aortic stenosis is present. Aortic valve mean gradient measures 8.0 mmHg. Aortic valve peak gradient measures 14.4 mmHg. Aortic valve area, by VTI measures 2.40 cm. Pulmonic Valve: The pulmonic valve was normal in structure. Pulmonic valve regurgitation is not visualized. No evidence of pulmonic stenosis. Aorta: The aortic root is normal in size and structure. Venous: The inferior vena cava is normal in size with greater than 50% respiratory variability, suggesting right atrial pressure of 3 mmHg. IAS/Shunts: No atrial level shunt detected by color flow Doppler.  LEFT VENTRICLE PLAX 2D LVIDd:         3.21 cm  Diastology LVIDs:         2.02 cm  LV e' lateral:   8.49 cm/s LV PW:         1.31 cm  LV E/e' lateral: 6.1 LV IVS:        1.40 cm  LV e' medial:    8.16 cm/s LVOT diam:     2.00 cm  LV E/e' medial:  6.3 LV SV:         77 LV SV Index:   34 LVOT Area:     3.14 cm  RIGHT VENTRICLE RV S prime:     23.70 cm/s TAPSE (M-mode): 2.6 cm LEFT ATRIUM             Index       RIGHT ATRIUM           Index LA diam:         2.80 cm 1.25 cm/m  RA Area:     13.70 cm LA Vol (A2C):   34.2 ml 15.26 ml/m RA Volume:   29.60 ml  13.23 ml/m LA Vol (A4C):   53.2 ml 23.78 ml/m LA Biplane Vol: 47.0 ml 21.00 ml/m  AORTIC VALVE                    PULMONIC VALVE AV Area (Vmax):    2.14 cm     PV Vmax:        1.11 m/s AV Area (Vmean):   2.31 cm     PV Peak grad:   4.9 mmHg AV Area (VTI):     2.40 cm     RVOT Peak grad: 7 mmHg AV Vmax:           189.50 cm/s AV Vmean:          130.500 cm/s AV VTI:            0.320 m AV Peak Grad:      14.4 mmHg AV Mean Grad:      8.0 mmHg LVOT Vmax:         129.00 cm/s LVOT Vmean:        96.100 cm/s LVOT VTI:          0.245 m LVOT/AV VTI ratio: 0.76  AORTA Ao Root diam: 3.10 cm MITRAL VALVE MV Area (PHT): 4.10 cm  SHUNTS MV Decel Time: 185 msec    Systemic VTI:  0.24 m MV E velocity: 51.40 cm/s  Systemic Diam: 2.00 cm MV A velocity: 82.00 cm/s MV E/A ratio:  0.63 Julien Nordmann MD Electronically signed by Julien Nordmann MD Signature Date/Time: 06/05/2020/6:38:49 PM    Final      Subjective: No issues overnight.  Interpreter: Sue Lush #[id number]  Discharge Exam: Vitals:   06/16/20 0024 06/16/20 0809  BP: 101/72 (!) 129/89  Pulse: 88 84  Resp: 18 16  Temp: 98.1 F (36.7 C) 98.5 F (36.9 C)  SpO2: 97% 96%   Vitals:   06/15/20 0723 06/15/20 1602 06/16/20 0024 06/16/20 0809  BP: 120/84 126/84 101/72 (!) 129/89  Pulse: 81 83 88 84  Resp: 16 16 18 16   Temp: 98.7 F (37.1 C) 98.5 F (36.9 C) 98.1 F (36.7 C) 98.5 F (36.9 C)  TempSrc: Oral Oral Oral Oral  SpO2: 96% 98% 97% 96%  Weight:      Height:        General: Pt is alert, awake, not in acute distress Cardiovascular: RRR, S1/S2 +, no rubs, no gallops Respiratory: CTA bilaterally, no wheezing, no rhonchi Abdominal: Soft, NT, ND, bowel sounds + Extremities: no edema, no cyanosis    The results of significant diagnostics from this hospitalization (including imaging, microbiology, ancillary and laboratory) are listed below for  reference.     Microbiology: Recent Results (from the past 240 hour(s))  MRSA PCR Screening     Status: None   Collection Time: 06/08/20  3:25 PM   Specimen: Nasopharyngeal  Result Value Ref Range Status   MRSA by PCR NEGATIVE NEGATIVE Final    Comment:        The GeneXpert MRSA Assay (FDA approved for NASAL specimens only), is one component of a comprehensive MRSA colonization surveillance program. It is not intended to diagnose MRSA infection nor to guide or monitor treatment for MRSA infections. Performed at [facility], [address] [place], [place] [address]      Labs: BNP (last 3 results) No results for input(s): BNP in the last 8760 hours. Basic Metabolic Panel: Recent Labs  Lab 06/11/20 0513 06/16/20 0512  NA 139  --   K 3.8  --   CL 107  --   CO2 25  --   GLUCOSE 104*  --   BUN 10  --   CREATININE 1.12 1.23  CALCIUM 7.7*  --   MG 2.1  --    Liver Function Tests: No results for input(s): AST, ALT, ALKPHOS, BILITOT, PROT, ALBUMIN in the last 168 hours. No results for input(s): LIPASE, AMYLASE in the last 168 hours. No results for input(s): AMMONIA in the last 168 hours. CBC: Recent Labs  Lab 06/12/20 0403 06/13/20 0448 06/14/20 0443 06/15/20 0500 06/16/20 0512  WBC 7.6 7.6 8.0 7.8 8.7  HGB 9.4* 10.3* 10.3* 10.6* 11.2*  HCT 28.0* 31.1* 31.2* 32.1* 32.5*  MCV 88.1 88.1 88.6 88.9 86.4  PLT 150 198 234 260 286   Cardiac Enzymes: No results for input(s): CKTOTAL, CKMB, CKMBINDEX, TROPONINI in the last 168 hours. BNP: Invalid input(s): POCBNP CBG: Recent Labs  Lab 06/15/20 1138 06/15/20 1637 06/15/20 2113 06/16/20 0812 06/16/20 1149  GLUCAP 88 98 102* 97 111*   D-Dimer No results for input(s): DDIMER in the last 72 hours. Hgb A1c No results for input(s): HGBA1C in the last 72 hours. Lipid Profile No results for input(s): CHOL, HDL, LDLCALC, TRIG, CHOLHDL, LDLDIRECT in  the last 72 hours. Thyroid function studies No results  for input(s): TSH, T4TOTAL, T3FREE, THYROIDAB in the last 72 hours.  Invalid input(s): FREET3 Anemia work up No results for input(s): VITAMINB12, FOLATE, FERRITIN, TIBC, IRON, RETICCTPCT in the last 72 hours. Urinalysis    Component Value Date/Time   COLORURINE STRAW (A) 06/06/2020 1638   APPEARANCEUR CLEAR (A) 06/06/2020 1638   LABSPEC 1.004 (L) 06/06/2020 1638   PHURINE 6.0 06/06/2020 1638   GLUCOSEU NEGATIVE 06/06/2020 1638   HGBUR NEGATIVE 06/06/2020 1638   BILIRUBINUR NEGATIVE 06/06/2020 1638   KETONESUR NEGATIVE 06/06/2020 1638   PROTEINUR NEGATIVE 06/06/2020 1638   NITRITE NEGATIVE 06/06/2020 1638   LEUKOCYTESUR NEGATIVE 06/06/2020 1638   Sepsis Labs Invalid input(s): PROCALCITONIN,  WBC,  LACTICIDVEN Microbiology Recent Results (from the past 240 hour(s))  MRSA PCR Screening     Status: None   Collection Time: 06/08/20  3:25 PM   Specimen: Nasopharyngeal  Result Value Ref Range Status   MRSA by PCR NEGATIVE NEGATIVE Final    Comment:        The GeneXpert MRSA Assay (FDA approved for NASAL specimens only), is one component of a comprehensive MRSA colonization surveillance program. It is not intended to diagnose MRSA infection nor to guide or monitor treatment for MRSA infections. Performed at [facility], [address] [place], [place] [address]      Time coordinating discharge: 35 minutes  SIGNED:   Jacquelin Hawking, MD [facility] 06/16/2020, 1:08 PM

## 2020-06-16 NOTE — Progress Notes (Signed)
ANTICOAGULATION CONSULT NOTE - Initial Consult  Pharmacy Consult for Apixaban Indication: ischemic limbs due to significant bilateral occlusive DVTs  No Known Allergies  Patient Measurements: Height: 5\' 11"  (180.3 cm) Weight: (!) 102.4 kg (225 lb 11.2 oz) IBW/kg (Calculated) : 75.3 Heparin Dosing Weight:    Vital Signs: Temp: 98.5 F (36.9 C) (07/27 0809) Temp Source: Oral (07/27 0809) BP: 129/89 (07/27 0809) Pulse Rate: 84 (07/27 0809)  Labs: Recent Labs    06/14/20 0443 06/14/20 0443 06/15/20 0500 06/16/20 0512  HGB 10.3*   < > 10.6* 11.2*  HCT 31.2*  --  32.1* 32.5*  PLT 234  --  260 286  HEPARINUNFRC 0.66  --  0.55 0.92*   < > = values in this interval not displayed.    Estimated Creatinine Clearance: 110 mL/min (by C-G formula based on SCr of 1.12 mg/dL).   Medical History: History reviewed. No pertinent past medical history.  Medications:  Scheduled:  . apixaban  10 mg Oral BID   Followed by  . [START ON 06/23/2020] apixaban  5 mg Oral BID  . Chlorhexidine Gluconate Cloth  6 each Topical Daily  . docusate sodium  100 mg Oral BID  . pantoprazole  40 mg Oral Q0600  . Ensure Max Protein  11 oz Oral BID   Infusions:    Assessment: [name] admitted for limb ischemia due to bilateral occlusive DVTs. Patient is s/p thrombectomy. The patient had a decrease in platelets following Heparin infusion, HIT ordered. Patient was switched to argatroban. HIT negative 7/17. Patient to switch back to heparin infusion. Argatroban stopped approximately 0830 07/22, heparin infusion was started at approximately 1030am 07/22 -7/27 to transition to apixaban   Goal of Therapy:  Monitor platelets by anticoagulation protocol: Yes   Plan:  Will transition from Heparin drip to Apixaban 10 mg PO BID x 7 days then 5 mg PO BID for extensive bilateral occlusive DVTs.   Austin Glover A 06/16/2020,8:30 AM

## 2020-06-16 NOTE — Progress Notes (Signed)
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  No Known Allergies  Patient Measurements: Height: 5\' 11"  (180.3 cm) Weight: (!) 102.4 kg (225 lb 11.2 oz) IBW/kg (Calculated) : 75.3    Vital Signs: Temp: 98.1 F (36.7 C) (07/27 0024) Temp Source: Oral (07/27 0024) BP: 101/72 (07/27 0024) Pulse Rate: 88 (07/27 0024)  Labs: Recent Labs    06/14/20 0443 06/14/20 0443 06/15/20 0500 06/16/20 0512  HGB 10.3*   < > 10.6* 11.2*  HCT 31.2*  --  32.1* 32.5*  PLT 234  --  260 286  HEPARINUNFRC 0.66  --  0.55 0.92*   < > = values in this interval not displayed.    Estimated Creatinine Clearance: 110 mL/min (by C-G formula based on SCr of 1.12 mg/dL).  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 Patient is s/p thrombectomy. The patient had a decrease in platelets following Heparin infusion, HIT ordered. Patient was switched to argatroban. HIT negative 7/17. Patient to switch back to heparin infusion. Argatroban stopped approximately 0830 07/22, heparin infusion was started at approximately 1030am 07/22  0723 0403 HL 0.54, therapeutic x 1, CBC stable 0723 1015 HL 0.61, therapeutic x 2 0724 0448 HL 0.64, therapeutic x 3 0725 0443 HL 0.66, therapeutic x 4.  CBC stable 0726 0500 HL 0.55, therapeutic x 5.  CBC stable.    Goal of Therapy:  HL 0.3 - 0.7  Monitor platelets by anticoagulation protocol: Yes   Plan:  07/27 @ 0500 HL 0.92 supratherapeutic. Will reduce rate to 1700 units/hr and will recheck HL at 1200 and continue to monitor.  8/27, PharmD Clinical Pharmacist 06/16/2020 6:14 AM

## 2020-06-16 NOTE — Plan of Care (Signed)
  Problem: Education: Goal: Knowledge of General Education information will improve Description: Including pain rating scale, medication(s)/side effects and non-pharmacologic comfort measures Outcome: Progressing   Problem: Clinical Measurements: Goal: Ability to maintain clinical measurements within normal limits will improve Outcome: Progressing Goal: Will remain free from infection Outcome: Progressing Goal: Diagnostic test results will improve Outcome: Progressing Goal: Respiratory complications will improve Outcome: Progressing Goal: Cardiovascular complication will be avoided Outcome: Progressing   Problem: Activity: Goal: Risk for activity intolerance will decrease Outcome: Progressing   Problem: Pain Managment: Goal: General experience of comfort will improve Outcome: Progressing   Problem: Skin Integrity: Goal: Risk for impaired skin integrity will decrease Outcome: Progressing   Problem: Safety: Goal: Ability to remain free from injury will improve Outcome: Progressing

## 2020-06-16 NOTE — Progress Notes (Signed)
Physical Therapy Treatment Patient Details Name: Austin Glover MRN: [id number] DOB: 1981-10-20 Today's Date: 06/16/2020    History of Present Illness Mr. Genia Hotter is a 39 y.o. male presents to the ED via EMS on 7/19 with c/o low back and bilateral leg pain that started yesterday. He was admitted with critical lower limb ischemia, with PAD with CT scan suggesting occlusion of both popliteal arteries and no flow seen distally. CT angio most notable for possible acute embolic etiology to the bilateral legs however during angiogram patient was found to have normal atherosclerotic/embolic disease during LE and aortic angogram (through L fem artery with star closure).  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  Procedure was performed on 06/08/20 and he was run on continuous thrombolytic therapy for 24 hours.  Patient has marked leg swelling and pain.  Venous intervention was performed to reduce the symptoms and avoid long term postphlebitic symptoms.    PT Comments    Pt was long sitting in bed upon arriving. He can communicate in english but prefers to use spanish interpretor when discussion/communicating about medical needs. Interpretor # [id number] use throughout entire session. He ws able to exit R side of bed without assistance. Reports no pain throughout session but did endorse some L posterior thigh soreness in sitting. He was able to ambulate without AD 250 ft without difficulty or LOB. Performed ascending/descending 1 step 3 x without AD without difficulty. Pt states he is confident in his abilities to safely perform at home. Pt returned to room and therapist discussed exercises to perform and safety concerns pt may come across. Overall he is progressing well. Follow surgeons recommendations for DC recs. He requires no DME.      Follow Up Recommendations  Follow surgeon's recommendation for DC plan and follow-up therapies     Equipment Recommendations  None  recommended by PT    Recommendations for Other Services       Precautions / Restrictions Precautions Precautions: Fall Precaution Comments: low fall Restrictions Weight Bearing Restrictions: No    Mobility  Bed Mobility Overal bed mobility: Modified Independent                Transfers Overall transfer level: Modified independent Equipment used: None Transfers: Sit to/from Stand Sit to Stand: Modified independent (Device/Increase time)         General transfer comment: slightly increased time to perform only. no assistance or cues required for safety.   Ambulation/Gait Ambulation/Gait assistance: Supervision Gait Distance (Feet): 250 Feet Assistive device: None Gait Pattern/deviations: WFL(Within Functional Limits) Gait velocity: slightly decreased   General Gait Details: pt was able to ambulate 250 ft without LOB or unsteadiness. does not report pain today.    Stairs Stairs: Yes Stairs assistance: Supervision Stair Management: No rails Number of Stairs: 1 General stair comments: pt performed acsending/descending 1 steps 3 x. 1 x with +1 HHA then 2 x without AD without difficulty. pt demonstrates safe ability to perform and feels confident he will be able to perform I'ly at home at DC   Wheelchair Mobility    Modified Rankin (Stroke Patients Only)       Balance Overall balance assessment: Needs assistance Sitting-balance support: Feet supported;No upper extremity supported Sitting balance-Leahy Scale: Normal Sitting balance - Comments: no overt LOB noted seated EOB   Standing balance support: No upper extremity supported;During functional activity Standing balance-Leahy Scale: Good Standing balance comment: no LOB without use of AD  Cognition Arousal/Alertness: Awake/alert Behavior During Therapy: WFL for tasks assessed/performed Overall Cognitive Status: Within Functional Limits for tasks assessed                                  General Comments: POt is A and O x 4 and cooperative and pleasant throughout      Exercises Other Exercises Other Exercises: discussed physical activityt to perform when DC'd home    General Comments        Pertinent Vitals/Pain Pain Assessment: No/denies pain Faces Pain Scale: No hurt Pain Location: reports testicle pain resolves- endorses L posterior thigh discomfort in sitting " doesn't hurt, just sore." Pain Descriptors / Indicators: Sore Pain Intervention(s): Limited activity within patient's tolerance;Monitored during session    Home Living                      Prior Function            PT Goals (current goals can now be found in the care plan section) Acute Rehab PT Goals Patient Stated Goal: " Home when safe to." Progress towards PT goals: Progressing toward goals    Frequency    Min 2X/week      PT Plan Discharge plan needs to be updated    Co-evaluation              AM-PAC PT "6 Clicks" Mobility   Outcome Measure  Help needed turning from your back to your side while in a flat bed without using bedrails?: None Help needed moving from lying on your back to sitting on the side of a flat bed without using bedrails?: None Help needed moving to and from a bed to a chair (including a wheelchair)?: None Help needed standing up from a chair using your arms (e.g., wheelchair or bedside chair)?: None Help needed to walk in hospital room?: None Help needed climbing 3-5 steps with a railing? : None 6 Click Score: 24    End of Session Equipment Utilized During Treatment:  (none) Activity Tolerance: Patient tolerated treatment well Patient left: in chair;with call bell/phone within reach;with chair alarm set Nurse Communication: Mobility status PT Visit Diagnosis: Unsteadiness on feet (R26.81);Muscle weakness (generalized) (M62.81);Dizziness and giddiness (R42);Pain Pain - Right/Left: Left Pain - part of  body: Leg     Time: 3299-2426 PT Time Calculation (min) (ACUTE ONLY): 17 min  Charges:  $Gait Training: 8-22 mins                     Jetta Lout PTA 06/16/20, 10:55 AM

## 2020-06-16 NOTE — Progress Notes (Signed)
Patient discharged home , IV removed from LFA , transported via w/c to personal vehicle , wife picked him up , discharge instructions explained

## 2020-06-24 ENCOUNTER — Ambulatory Visit (INDEPENDENT_AMBULATORY_CARE_PROVIDER_SITE_OTHER): Payer: Self-pay | Admitting: Urology

## 2020-06-24 ENCOUNTER — Encounter: Payer: Self-pay | Admitting: Urology

## 2020-06-24 ENCOUNTER — Other Ambulatory Visit: Payer: Self-pay

## 2020-06-24 VITALS — BP 142/85 | HR 132 | Ht 71.0 in | Wt 220.0 lb

## 2020-06-24 DIAGNOSIS — R1900 Intra-abdominal and pelvic swelling, mass and lump, unspecified site: Secondary | ICD-10-CM

## 2020-06-24 DIAGNOSIS — N261 Atrophy of kidney (terminal): Secondary | ICD-10-CM

## 2020-06-24 DIAGNOSIS — N1339 Other hydronephrosis: Secondary | ICD-10-CM

## 2020-06-24 NOTE — Progress Notes (Signed)
06/24/20 1:05 PM   Pia Mau [birth year] [id number]  CC: Retroperitoneal mass, right hydronephrosis with atrophic kidney  HPI: I saw Mr. Austin Glover in urology clinic today for the above issues.  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  This also corresponded to the level of the obstruction for severe right hydronephrosis with a completely atrophic right kidney.  He was managed by vascular surgery with anticoagulation and an IVC stent as well as thrombectomy.  He denies any complaints today.  He denies any right-sided flank pain.  He denies any urinary symptoms.  General surgery and interventional radiology were consulted for possible biopsy of the mass, however this was felt to be not technically feasible.  He is being treated as retroperitoneal fibrosis.  Recent renal function is normal with creatinine of 1.2, EGFR greater than 60.  Surgical History: Past Surgical History:  Procedure Laterality Date  . LOWER EXTREMITY ANGIOGRAPHY Right 06/04/2020   Procedure: Lower Extremity Angiography;  Surgeon: Algernon Huxley, MD;  Location: [facility];  Service: Cardiovascular;  Laterality: Right;  . PERIPHERAL VASCULAR THROMBECTOMY Bilateral 06/08/2020   Procedure: PERIPHERAL VASCULAR THROMBECTOMY, BLE and IVC;  Surgeon: Algernon Huxley, MD;  Location: [facility];  Service: Cardiovascular;  Laterality: Bilateral;  . PERIPHERAL VASCULAR THROMBECTOMY Bilateral 06/10/2020   Procedure: PERIPHERAL VASCULAR THROMBECTOMY;  Surgeon: Algernon Huxley, MD;  Location: [facility];  Service: Cardiovascular;  Laterality: Bilateral;  . TONSILLECTOMY     Family History: Family History  Problem Relation Age of Onset  . Diabetes Mellitus II Father   . Kidney Stones  Father     Social History:  reports that he has never smoked. He has never used smokeless tobacco. He reports current alcohol use. He reports that he does not use drugs.  Physical Exam: BP (!) 142/85   Pulse (!) 132   Ht 5' 11"  (1.803 m)   Wt 220 lb (99.8 kg)   BMI 30.68 kg/m    Constitutional:  Alert and oriented, No acute distress. Cardiovascular: No clubbing, cyanosis, or edema. Respiratory: Normal respiratory effort, no increased work of breathing. GI: Abdomen is soft, nontender, nondistended, no abdominal masses GU: No CVA tenderness Circumcised phallus, patent meatus, testicles 25 cc and descended bilaterally without masses.  Laboratory Data: Reviewed, see HPI  Pertinent Imaging: I have personally reviewed the CT and MRI, see HPI for details.  Right kidney completely atrophic with severe hydro-.  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  Infectious work-up was negative.  The mass was not felt to be amenable to biopsy by interventional radiology or general surgery, and he is being managed as retroperitoneal fibrosis.  I reviewed his images with him today, and we discussed that there is no role for any ureteral stent or nephrostomy tube placement in the setting of his completely atrophic and asymptomatic right kidney.  We discussed  other possible etiology for his retroperitoneal mass, though unlikely, could be a burned-out testicular cancer, and I recommended a scrotal ultrasound to rule out any testicular pathology.  We will call with those results.  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

## 2020-07-13 ENCOUNTER — Telehealth (INDEPENDENT_AMBULATORY_CARE_PROVIDER_SITE_OTHER): Payer: Self-pay | Admitting: Vascular Surgery

## 2020-07-13 NOTE — Telephone Encounter (Signed)
Refill has being called into pharmacy and patient has being made aware

## 2020-07-13 NOTE — Telephone Encounter (Signed)
The pt had extensive bilateral lower extremity and IVC DVT, occluded IVC, status post overnight thrombolytic therapy  Catheter placement into IVC from bilateral popliteal approach on 7/21 at [facility] done by DEW he will run out of blood thinner before his F/U on 9/3

## 2020-07-13 NOTE — Telephone Encounter (Signed)
Called stating that when he had his procedure done 06-10-20 (Peripheral Thrombectomy) and he was prescribed blood thinners. He will run out of the RX before his follow up with JD on 07-24-20. He is requesting for prescription be sent over to last him until his next appt. Please advise.

## 2020-07-15 ENCOUNTER — Ambulatory Visit: Payer: Self-pay | Admitting: Pharmacy Technician

## 2020-07-15 DIAGNOSIS — Z79899 Other long term (current) drug therapy: Secondary | ICD-10-CM

## 2020-07-16 ENCOUNTER — Other Ambulatory Visit: Payer: Self-pay

## 2020-07-16 NOTE — Progress Notes (Signed)
  Completed [facility] application and contract.  Patient agreed to all terms of the [facility] contract.    Patient approved to receive medication assistance at [facility] until time for re-certification in 4967 and as long as eligibility criteria continues to be met.    Provided patient with Civil engineer, contracting based on his particular needs.    [facility]

## 2020-07-21 ENCOUNTER — Encounter (INDEPENDENT_AMBULATORY_CARE_PROVIDER_SITE_OTHER): Payer: Self-pay

## 2020-07-22 ENCOUNTER — Ambulatory Visit
Admission: RE | Admit: 2020-07-22 | Discharge: 2020-07-22 | Disposition: A | Discharge status: Home / Self Care | Payer: Self-pay | Source: Ambulatory Visit | Attending: Urology | Admitting: Urology

## 2020-07-22 ENCOUNTER — Other Ambulatory Visit (INDEPENDENT_AMBULATORY_CARE_PROVIDER_SITE_OTHER): Payer: Self-pay | Admitting: Vascular Surgery

## 2020-07-22 ENCOUNTER — Other Ambulatory Visit: Payer: Self-pay

## 2020-07-22 DIAGNOSIS — R1900 Intra-abdominal and pelvic swelling, mass and lump, unspecified site: Secondary | ICD-10-CM | POA: Insufficient documentation

## 2020-07-22 DIAGNOSIS — Z95828 Presence of other vascular implants and grafts: Secondary | ICD-10-CM

## 2020-07-22 IMAGING — US US SCROTUM W/ DOPPLER COMPLETE
1 series · 14 of 25 positions shown · non-contrast
Comparison: CT imaging [DATE], MR [DATE]

CLINICAL DATA: Retroperitoneal mass, presumed retroperitoneal
fibrosis.

EXAM:
SCROTAL ULTRASOUND
DOPPLER ULTRASOUND OF THE TESTICLES
TECHNIQUE: Complete ultrasound examination of the testicles, epididymis, and
other scrotal structures was performed. Color and spectral Doppler
ultrasound were also utilized to evaluate blood flow to the
testicles.

[Series 1: us scrotum w/ doppler complete · 0.06mm/px · 14 of 67 slices shown]
[im 1/67]
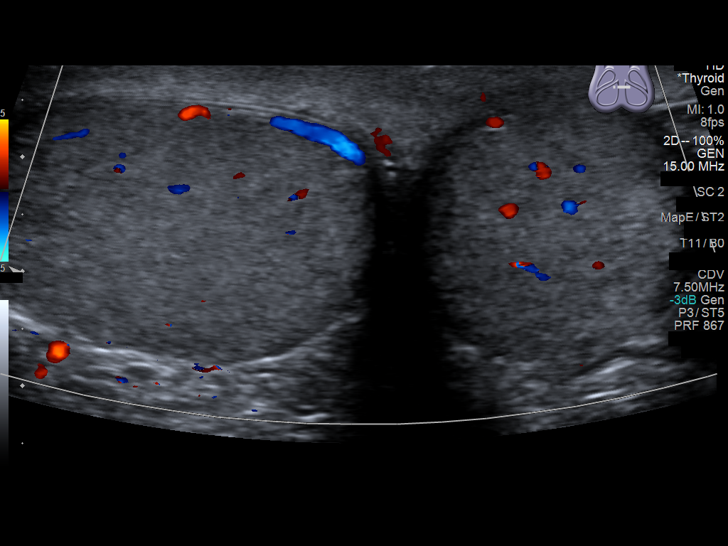
[im 6/67]
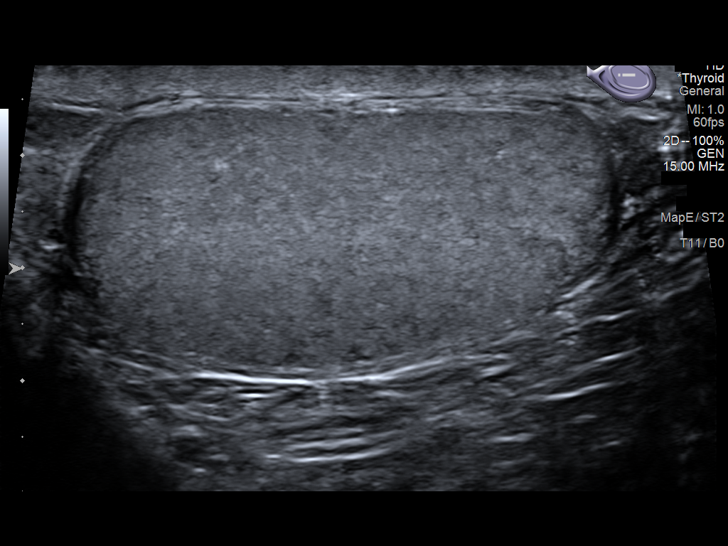
[im 12/67]
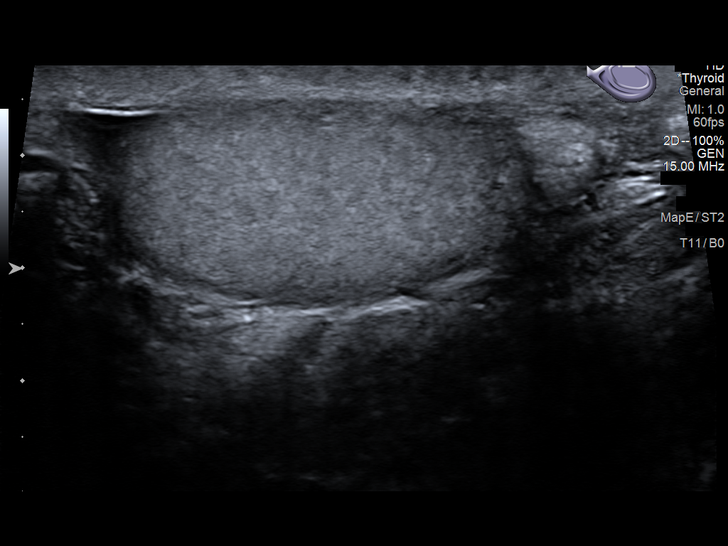
[im 17/67]
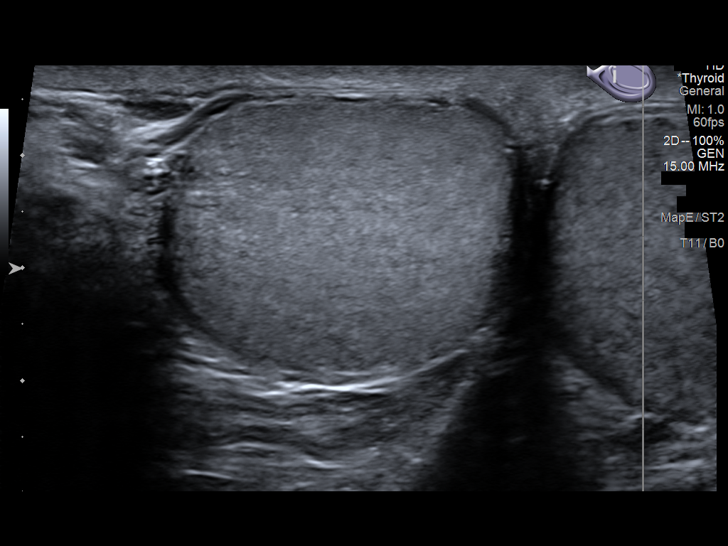
[im 23/67]
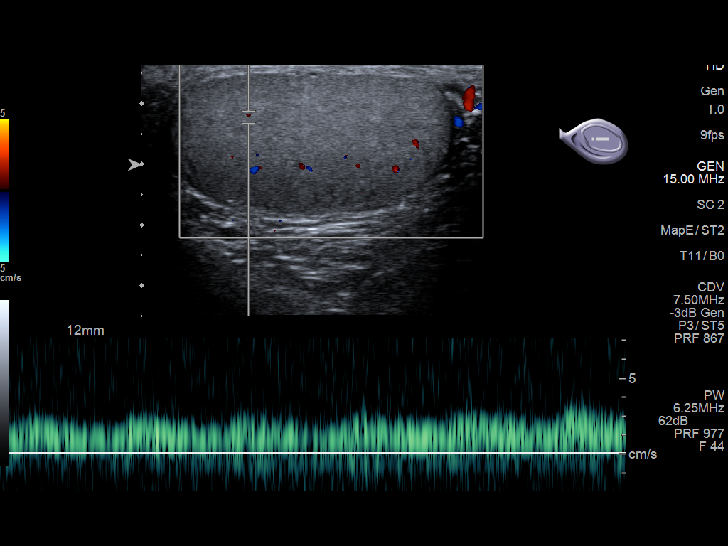
[im 25/67]
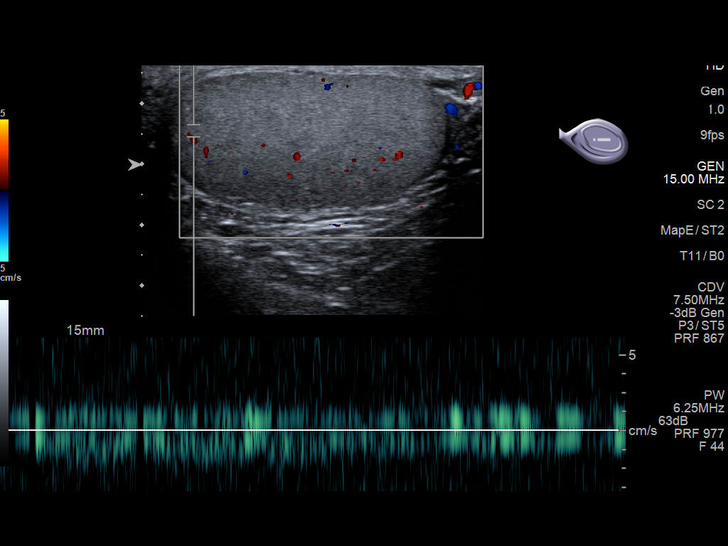
[im 31/67]
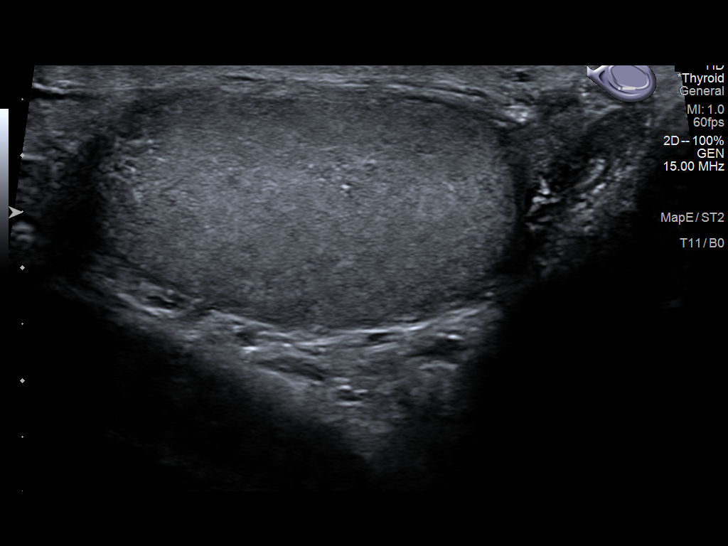
[im 36/67]
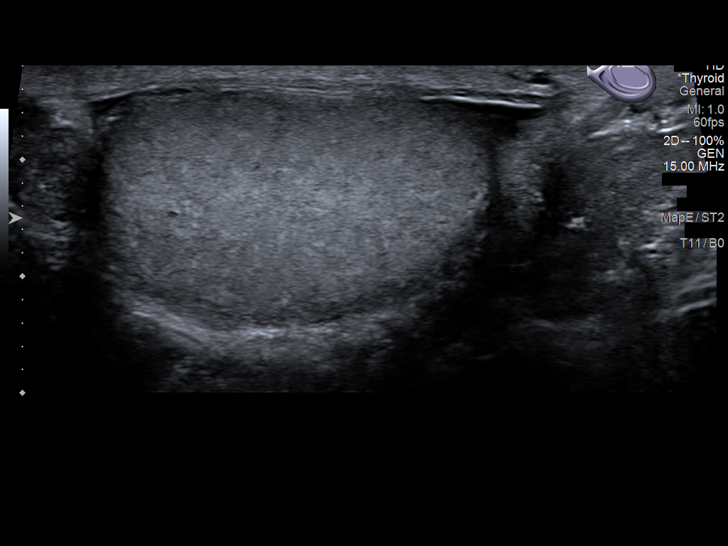
[im 42/67]
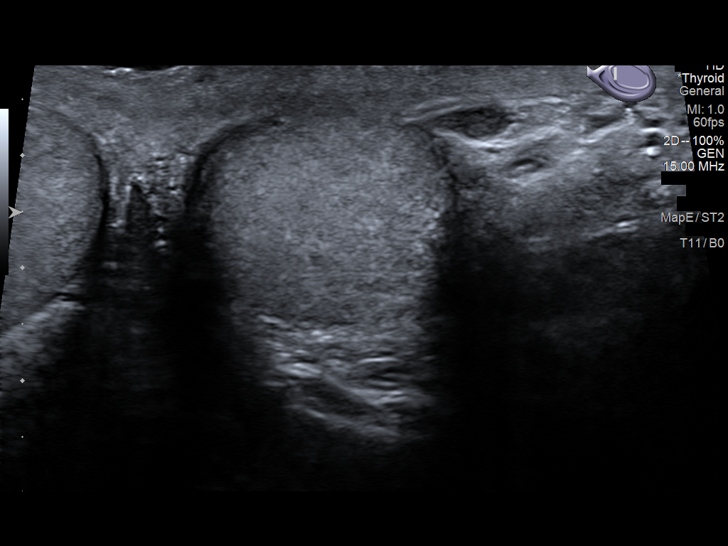
[im 45/67]
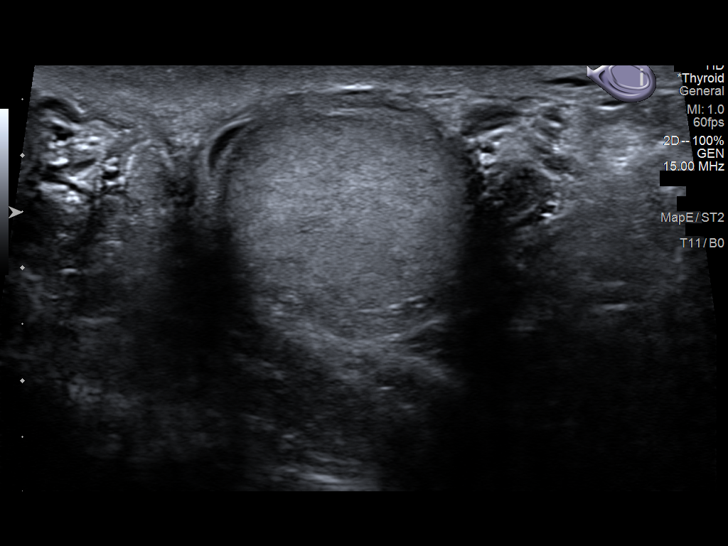
[im 50/67]
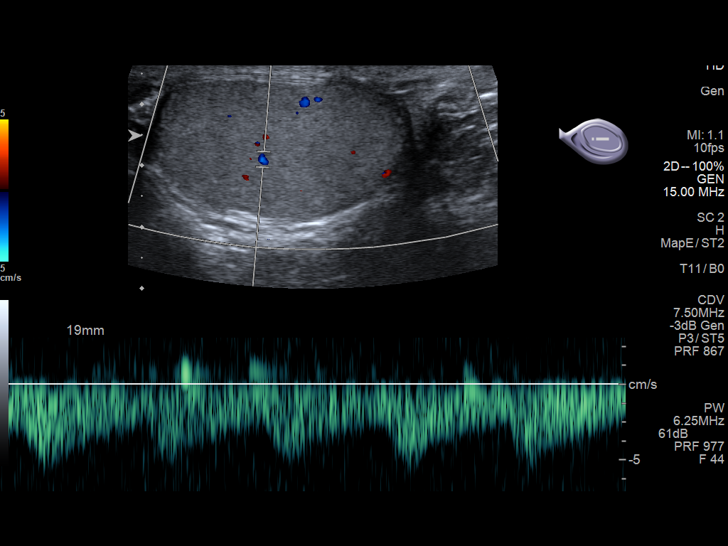
[im 56/67]
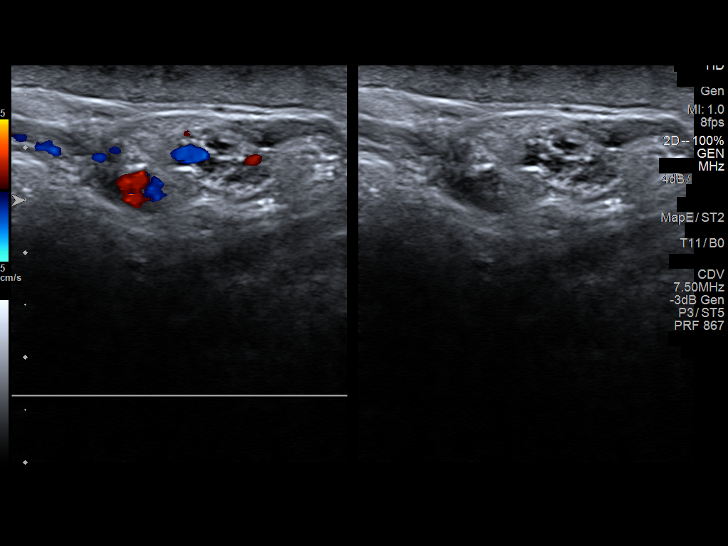
[im 61/67]
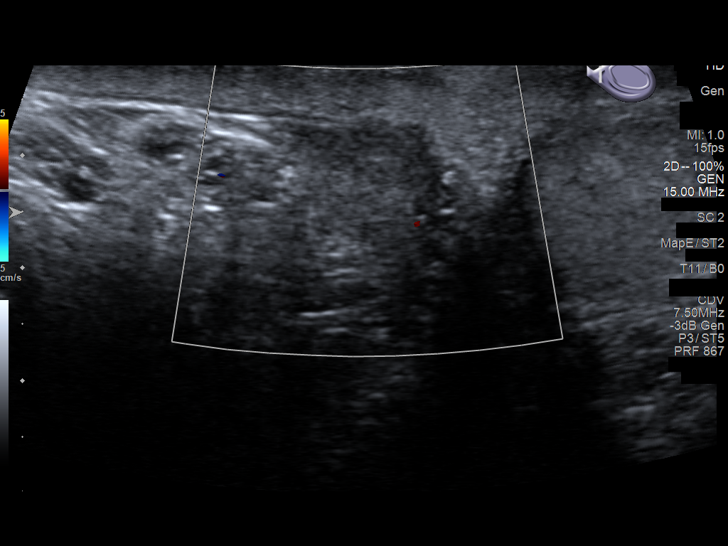
[im 67/67]
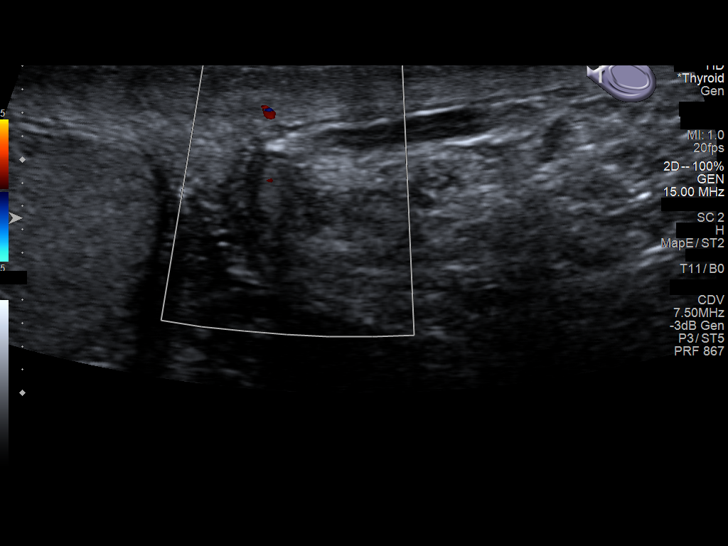

[14 of 25 positions shown; findings below may reference images not displayed]

FINDINGS: Right testicle

Measurements: 4.7 x 2.4 x 2.9 cm. Normal parenchymal echogenicity.
No testicular mass or microlithiasis.

Left testicle

Measurements: 4.6 x 2.4 x 3.3 cm. Normal parenchymal echogenicity.
No testicular mass or microlithiasis.

Right epididymis:  Normal in size and appearance.

Left epididymis:  Normal in size and appearance.

Hydrocele:  None visualized.

Varicocele:  None visualized.

Pulsed Doppler interrogation of both testes demonstrates normal low
resistance arterial and venous waveforms bilaterally.
IMPRESSION: No evidence of testicular mass. No features of testicular torsion
with a normal Doppler evaluation.

## 2020-07-24 ENCOUNTER — Ambulatory Visit (INDEPENDENT_AMBULATORY_CARE_PROVIDER_SITE_OTHER): Payer: Self-pay | Admitting: Vascular Surgery

## 2020-07-24 ENCOUNTER — Other Ambulatory Visit: Payer: Self-pay

## 2020-07-24 ENCOUNTER — Encounter (INDEPENDENT_AMBULATORY_CARE_PROVIDER_SITE_OTHER): Payer: Self-pay | Admitting: Vascular Surgery

## 2020-07-24 ENCOUNTER — Ambulatory Visit (INDEPENDENT_AMBULATORY_CARE_PROVIDER_SITE_OTHER): Payer: Self-pay

## 2020-07-24 ENCOUNTER — Telehealth: Payer: Self-pay

## 2020-07-24 VITALS — BP 134/92 | HR 76 | Ht 71.0 in | Wt 220.0 lb

## 2020-07-24 DIAGNOSIS — Z95828 Presence of other vascular implants and grafts: Secondary | ICD-10-CM

## 2020-07-24 DIAGNOSIS — N179 Acute kidney failure, unspecified: Secondary | ICD-10-CM

## 2020-07-24 DIAGNOSIS — N135 Crossing vessel and stricture of ureter without hydronephrosis: Secondary | ICD-10-CM

## 2020-07-24 DIAGNOSIS — I82403 Acute embolism and thrombosis of unspecified deep veins of lower extremity, bilateral: Secondary | ICD-10-CM

## 2020-07-24 NOTE — Progress Notes (Signed)
MRN : [id number]  Austin Glover is a 39 y.o. (1981/02/02) male who presents with chief complaint of  Chief Complaint  Patient presents with  . Follow-up    2 week  [facility] post peripheral vascular thombectomy IVC BIL LE Ven DVT   .  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  He was treated about 6 weeks ago with extensive thrombectomy and eventually bilateral iliac vein stents up into the IVC distally.  He has done very well.  He remains on Eliquis and has tolerated it fine.  He has been on prednisone for his retroperitoneal fibrosis.  His duplex today shows his iliac veins and IVC to be widely patent with no evidence of thrombus or stenosis today.  His legs look great.  He has almost no swelling or discomfort to speak of over the past several weeks.  Current Outpatient Medications  Medication Sig Dispense Refill  . APIXABAN (ELIQUIS) VTE STARTER PACK (10MG AND 5MG) Take as directed on package: start with two-63m tablets twice daily for 7 days. On day 8, switch to one-583mtablet twice daily. 1 each 0  . predniSONE (DELTASONE) 20 MG tablet Take by mouth.     No current facility-administered medications for this visit.    No past medical history on file.  Past Surgical History:  Procedure Laterality Date  . LOWER EXTREMITY ANGIOGRAPHY Right 06/04/2020   Procedure: Lower Extremity Angiography;  Surgeon: DeAlgernon HuxleyMD;  [facility];  Service: Cardiovascular;  Laterality: Right;  . PERIPHERAL VASCULAR THROMBECTOMY Bilateral 06/08/2020   Procedure: PERIPHERAL VASCULAR THROMBECTOMY, BLE and IVC;  Surgeon: DeAlgernon HuxleyMD;  [facility];  Service: Cardiovascular;  Laterality: Bilateral;  . PERIPHERAL VASCULAR THROMBECTOMY Bilateral 06/10/2020   Procedure: PERIPHERAL VASCULAR THROMBECTOMY;  Surgeon: DeAlgernon HuxleyMD;  [facility];  Service:  Cardiovascular;  Laterality: Bilateral;  . TONSILLECTOMY       Social History   Tobacco Use  . Smoking status: Never Smoker  . Smokeless tobacco: Never Used  Vaping Use  . Vaping Use: Never used  Substance Use Topics  . Alcohol use: Yes    Comment: occasional  . Drug use: Never     Family History  Problem Relation Age of Onset  . Diabetes Mellitus II Father   . Kidney Stones Father     No Known Allergies   REVIEW OF SYSTEMS (Negative unless checked)  Constitutional: _0 Weight loss  _1 Fever  _2 Chills Cardiac: _3 Chest pain   _4 Chest pressure   _5 Palpitations   _6 Shortness of breath when laying flat   _7 Shortness of breath at rest   _8 Shortness of breath with exertion. Vascular:  _9 Pain in legs with walking   _10 Pain in legs at rest   _11 Pain in legs when laying flat   _12 Claudication   _13 Pain in feet when walking  _14 Pain in feet at rest  _15 Pain in feet when laying flat   _16 History of DVT   _17 Phlebitis   _18 Swelling in legs   _19 Varicose veins   _20 Non-healing ulcers Pulmonary:   _21 Uses home oxygen   _22 Productive cough   _23 Hemoptysis   _24 Wheeze  _25 COPD   _26 Asthma Neurologic:  _27 Dizziness  _28 Blackouts   _29 Seizures   _30 History of stroke   _31 History of TIA  _32 Aphasia   _33 Temporary blindness   _34 Dysphagia   _35 Weakness or numbness in arms   _36   Weakness or numbness in legs Musculoskeletal:  _0 Arthritis   _1 Joint swelling   _2 Joint pain   _3 Low back pain Hematologic:  _4 Easy bruising  _5 Easy bleeding   _6 Hypercoagulable state   _7 Anemic   Gastrointestinal:  _8 Blood in stool   _9 Vomiting blood  _10 Gastroesophageal reflux/heartburn   _11 Abdominal pain Genitourinary:  _12 Chronic kidney disease   _13 Difficult urination  _14 Frequent urination  _15 Burning with urination   _16 Hematuria Skin:  _17 Rashes   _18 Ulcers   _19 Wounds Psychological:  _20 History of anxiety   _21  History of major depression.  Physical Examination  BP (!) 134/92   Pulse 76   Ht _22  (1.803 m)   Wt 220 lb (99.8 kg)   BMI 30.68  kg/m  Gen:  WD/WN, NAD Head: [place]/AT, No temporalis wasting. Ear/Nose/Throat: Hearing grossly intact, nares w/o erythema or drainage Eyes: Conjunctiva clear. Sclera non-icteric Neck: Supple.  Trachea midline Pulmonary:  Good air movement, no use of accessory muscles.  Cardiac: RRR, no JVD Vascular:  Vessel Right Left  Radial Palpable Palpable                          PT Palpable Palpable  DP Palpable Palpable    Musculoskeletal: M/S 5/5 throughout.  No deformity or atrophy. No edema. Neurologic: Sensation grossly intact in extremities.  Symmetrical.  Speech is fluent.  Psychiatric: Judgment intact, Mood & affect appropriate for pt's clinical situation. Dermatologic: No rashes or ulcers noted.  No cellulitis or open wounds.       Labs Recent Results (from the past 2160 hour(s))  Comprehensive metabolic panel     Status: Abnormal   Collection Time: 06/04/20  7:56 AM  Result Value Ref Range   Sodium 137 135 - 145 mmol/L   Potassium 4.0 3.5 - 5.1 mmol/L    Comment: HEMOLYSIS AT THIS LEVEL MAY AFFECT RESULT   Chloride 107 98 - 111 mmol/L   CO2 10 (L) 22 - 32 mmol/L   Glucose, Bld 259 (H) 70 - 99 mg/dL    Comment: Glucose reference range applies only to samples taken after fasting for at least 8 hours.   BUN 22 (H) 6 - 20 mg/dL   Creatinine, Ser 1.89 (H) 0.61 - 1.24 mg/dL   Calcium 8.7 (L) 8.9 - 10.3 mg/dL   Total Protein 8.2 (H) 6.5 - 8.1 g/dL   Albumin 4.5 3.5 - 5.0 g/dL   AST 32 15 - 41 U/L   ALT 33 0 - 44 U/L   Alkaline Phosphatase 90 38 - 126 U/L   Total Bilirubin 1.2 0.3 - 1.2 mg/dL   GFR calc non Af Amer 44 (L) >60 mL/min   GFR calc Af Amer 51 (L) >60 mL/min   Anion gap 20 (H) 5 - 15    Comment: Performed at [facility] [address] [place], [place] [address]  CBC with Differential     Status: Abnormal   Collection Time: 06/04/20  7:56 AM  Result Value Ref Range   WBC 18.9 (H) 4.0 - 10.5 K/uL   RBC 5.37 4.22 - 5.81 MIL/uL   Hemoglobin 16.0  13.0 - 17.0 g/dL   HCT 47.4 39 - 52 %   MCV 88.3 80.0 - 100.0 fL   MCH 29.8 26.0 - 34.0 pg   MCHC 33.8 30.0 - 36.0 g/dL   RDW 12.2 11.5 - 15.5 %   Platelets 178 150 - 400 K/uL   nRBC 0.0 0.0 - 0.2 %  Neutrophils Relative % 74 %   Neutro Abs 14.0 (H) 1.7 - 7.7 K/uL   Lymphocytes Relative 18 %   Lymphs Abs 3.5 0.7 - 4.0 K/uL   Monocytes Relative 6 %   Monocytes Absolute 1.1 (H) 0 - 1 K/uL   Eosinophils Relative 1 %   Eosinophils Absolute 0.1 0 - 0 K/uL   Basophils Relative 0 %   Basophils Absolute 0.1 0 - 0 K/uL   Immature Granulocytes 1 %   Abs Immature Granulocytes 0.21 (H) 0.00 - 0.07 K/uL    Comment: Performed at [facility], [address] [place], [place] [address]  CK     Status: None   Collection Time: 06/04/20  7:56 AM  Result Value Ref Range   Total CK 175 49.0 - 397.0 U/L    Comment: Performed at [facility], [address] [place], [place] [address]  C-reactive protein     Status: None   Collection Time: 06/04/20  7:56 AM  Result Value Ref Range   CRP 0.9 <1.0 mg/dL    Comment: Performed at [facility] [address] [place], [place] [address]  Lactic acid, plasma     Status: Abnormal   Collection Time: 06/04/20  8:58 AM  Result Value Ref Range   Lactic Acid, Venous 5.1 (HH) 0.5 - 1.9 mmol/L    Comment: CRITICAL RESULT CALLED TO, READ BACK BY AND VERIFIED WITH KELLY GOODIN AT [phone] ON 06/04/2020 [place]. Performed at [facility], [address] [place], [place] [address]   Blood culture (routine x 2)     Status: None   Collection Time: 06/04/20  9:50 AM   Specimen: BLOOD  Result Value Ref Range   Specimen Description BLOOD LEFT AC    Special Requests      BOTTLES DRAWN AEROBIC AND ANAEROBIC Blood Culture adequate volume   Culture      NO GROWTH 5 DAYS Performed at [facility], [address], [place], [place] [address]    Report Status 06/09/2020 FINAL   Blood culture (routine x 2)     Status: None    Collection Time: 06/04/20  9:50 AM   Specimen: BLOOD  Result Value Ref Range   Specimen Description BLOOD RIGHT HAND    Special Requests      BOTTLES DRAWN AEROBIC AND ANAEROBIC Blood Culture adequate volume   Culture      NO GROWTH 5 DAYS Performed at [facility], [address] [place], [place] [address]    Report Status 06/09/2020 FINAL   SARS Coronavirus 2 by RT PCR (hospital order, performed in [place] hospital lab) Nasopharyngeal Nasopharyngeal Swab     Status: None   Collection Time: 06/04/20 10:18 AM   Specimen: Nasopharyngeal Swab  Result Value Ref Range   SARS Coronavirus 2 NEGATIVE NEGATIVE    Comment: (NOTE) SARS-CoV-2 target nucleic acids are NOT DETECTED.  The SARS-CoV-2 RNA is generally detectable in upper and lower respiratory specimens during the acute phase of infection. The lowest concentration of SARS-CoV-2 viral copies this assay can detect is 250 copies / mL. A negative result does not preclude SARS-CoV-2 infection and should not be used as the sole basis for treatment or other patient management decisions.  A negative result may occur with improper specimen collection / handling, submission of specimen other than nasopharyngeal swab, presence of viral mutation(s) within the areas targeted by this assay, and inadequate number of viral copies (<250 copies /  mL). A negative result must be combined with clinical observations, patient history, and epidemiological information.  Fact Sheet for Patients:   StrictlyIdeas.no  Fact Sheet for Healthcare Providers: BankingDealers.co.za  This test is not yet approved or  cleared by the [place] FDA and has been authorized for detection and/or diagnosis of SARS-CoV-2 by FDA under an Emergency Use Authorization (EUA).  This EUA will remain in effect (meaning this test can be used) for the duration of the COVID-19 declaration under Section 564(b)(1) of  the Act, 21 U.S.C. section 360bbb-3(b)(1), unless the authorization is terminated or revoked sooner.  Performed at [facility], [address] [place], [place] [address]   APTT     Status: None   Collection Time: 06/04/20 10:18 AM  Result Value Ref Range   aPTT 31 24 - 36 seconds    Comment: Performed at [facility], [address] [place], [place] [address]  Protime-INR     Status: None   Collection Time: 06/04/20 10:18 AM  Result Value Ref Range   Prothrombin Time 15.1 11.4 - 15.2 seconds   INR 1.2 0.8 - 1.2    Comment: (NOTE) INR goal varies based on device and disease states. Performed at [facility], [place], [place] [address]   Heparin level (unfractionated)     Status: None   Collection Time: 06/04/20  6:20 PM  Result Value Ref Range   Heparin Unfractionated 0.60 0.30 - 0.70 IU/mL    Comment: (NOTE) If heparin results are below expected values, and patient dosage has  been confirmed, suggest follow up testing of antithrombin III levels. Performed at [facility], [address] [place], [place] [address]   Lactic acid, plasma     Status: None   Collection Time: 06/04/20  6:20 PM  Result Value Ref Range   Lactic Acid, Venous 1.8 0.5 - 1.9 mmol/L    Comment: Performed at [facility], [address] [place], [place] [address]  Glucose, capillary     Status: Abnormal   Collection Time: 06/04/20  7:01 PM  Result Value Ref Range   Glucose-Capillary 127 (H) 70 - 99 mg/dL    Comment: Glucose reference range applies only to samples taken after fasting for at least 8 hours.  CBC     Status: Abnormal   Collection Time: 06/05/20  5:21 AM  Result Value Ref Range   WBC 11.5 (H) 4.0 - 10.5 K/uL   RBC 4.92 4.22 - 5.81 MIL/uL   Hemoglobin 15.0 13.0 - 17.0 g/dL   HCT 42.5 39 - 52 %   MCV 86.4 80.0 - 100.0 fL   MCH 30.5 26.0 - 34.0 pg   MCHC 35.3 30.0 - 36.0 g/dL   RDW 12.7 11.5 - 15.5 %   Platelets 124  (L) 150 - 400 K/uL    Comment: Immature Platelet Fraction may be clinically indicated, consider ordering this additional test [id number]    nRBC 0.0 0.0 - 0.2 %    Comment: Performed at [facility], [address] [place], [place] [address]  HIV Antibody (routine testing w rflx)     Status: None   Collection Time: 06/05/20  5:21 AM  Result Value Ref Range   HIV Screen 4th Generation wRfx Non Reactive Non Reactive    Comment: Performed at [facility], [address], [place], [place] [address]  Basic metabolic panel     Status: Abnormal   Collection Time:  06/05/20  5:21 AM  Result Value Ref Range   Sodium 137 135 - 145 mmol/L   Potassium 4.7 3.5 - 5.1 mmol/L   Chloride 109 98 - 111 mmol/L   CO2 19 (L) 22 - 32 mmol/L   Glucose, Bld 131 (H) 70 - 99 mg/dL    Comment: Glucose reference range applies only to samples taken after fasting for at least 8 hours.   BUN 21 (H) 6 - 20 mg/dL   Creatinine, Ser 1.57 (H) 0.61 - 1.24 mg/dL   Calcium 8.4 (L) 8.9 - 10.3 mg/dL   GFR calc non Af Amer 55 (L) >60 mL/min   GFR calc Af Amer >60 >60 mL/min   Anion gap 9 5 - 15    Comment: Performed at [facility], [address], [place], [place] [address]  Magnesium     Status: Abnormal   Collection Time: 06/05/20  5:21 AM  Result Value Ref Range   Magnesium 1.6 (L) 1.7 - 2.4 mg/dL    Comment: Performed at [facility], [address] [place], [place] [address]  Type and screen     Status: None   Collection Time: 06/05/20  5:21 AM  Result Value Ref Range   ABO/RH(D) O POS    Antibody Screen NEG    Sample Expiration      06/08/2020,2359 Performed at [facility], [address] [place], [place] [address]   Hemoglobin A1c     Status: None   Collection Time: 06/05/20  5:21 AM  Result Value Ref Range   Hgb A1c MFr Bld 5.5 4.8 - 5.6 %    Comment: (NOTE) Pre diabetes:          5.7%-6.4%  Diabetes:              >6.4%  Glycemic control for    <7.0% adults with diabetes    Mean Plasma Glucose 111.15 mg/dL    Comment: Performed at [facility], [place] [address] [place], [place] [address]  ECHOCARDIOGRAM COMPLETE     Status: None   Collection Time: 06/05/20  4:43 PM  Result Value Ref Range   Weight 3,680 oz   Height 71 in   BP 134/95 mmHg   Ao pk vel 1.90 m/s   AV Area VTI 2.40 cm2   AR max vel 2.14 cm2   AV Mean grad 8.0 mmHg   AV Peak grad 14.4 mmHg   S' Lateral 2.02 cm   AV Area mean vel 2.31 cm2   Area-P 1/2 4.10 cm2  Hemoglobin A1c     Status: None   Collection Time: 06/05/20 11:39 PM  Result Value Ref Range   Hgb A1c MFr Bld 5.5 4.8 - 5.6 %    Comment: (NOTE) Pre diabetes:          5.7%-6.4%  Diabetes:              >6.4%  Glycemic control for   <7.0% adults with diabetes    Mean Plasma Glucose 111.15 mg/dL    Comment: Performed at [facility] [address], [place], [place] [address]  Antistreptolysin O titer     Status: None   Collection Time: 06/05/20 11:39 PM  Result Value Ref Range   ASO 101.0 0.0 - 200.0 IU/mL    Comment: (NOTE) Performed At: [facility] [place], [place] [id number] Rush Farmer MD [id number]   Basic metabolic panel  Status: Abnormal   Collection Time: 06/05/20 11:39 PM  Result Value Ref Range   Sodium 131 (L) 135 - 145 mmol/L   Potassium 4.2 3.5 - 5.1 mmol/L   Chloride 102 98 - 111 mmol/L   CO2 23 22 - 32 mmol/L   Glucose, Bld 118 (H) 70 - 99 mg/dL    Comment: Glucose reference range applies only to samples taken after fasting for at least 8 hours.   BUN 19 6 - 20 mg/dL   Creatinine, Ser 1.62 (H) 0.61 - 1.24 mg/dL   Calcium 8.5 (L) 8.9 - 10.3 mg/dL   GFR calc non Af Amer 53 (L) >60 mL/min   GFR calc Af Amer >60 >60 mL/min   Anion gap 6 5 - 15    Comment: Performed at [facility], [address] [place], [place] [address]  C-reactive protein     Status: Abnormal   Collection Time: 06/05/20 11:39 PM  Result Value  Ref Range   CRP 10.4 (H) <1.0 mg/dL    Comment: Performed at [facility] [address] [place], [place] [address]  ESR     Status: None   Collection Time: 06/05/20 11:39 PM  Result Value Ref Range   Sed Rate 5 0 - 15 mm/hr    Comment: Performed at [facility], [address] [place], [place] [address]  CBC     Status: Abnormal   Collection Time: 06/06/20  3:51 AM  Result Value Ref Range   WBC 9.4 4.0 - 10.5 K/uL   RBC 4.25 4.22 - 5.81 MIL/uL   Hemoglobin 12.8 (L) 13.0 - 17.0 g/dL   HCT 36.4 (L) 39 - 52 %   MCV 85.6 80.0 - 100.0 fL   MCH 30.1 26.0 - 34.0 pg   MCHC 35.2 30.0 - 36.0 g/dL   RDW 12.2 11.5 - 15.5 %   Platelets 84 (L) 150 - 400 K/uL    Comment: Immature Platelet Fraction may be clinically indicated, consider ordering this additional test [id number]    nRBC 0.0 0.0 - 0.2 %    Comment: Performed at [facility], [address] [place], [place] [address]  Magnesium     Status: None   Collection Time: 06/06/20  3:51 AM  Result Value Ref Range   Magnesium 2.1 1.7 - 2.4 mg/dL    Comment: Performed at [facility], [address] [place], [place] [address]  Comprehensive metabolic panel     Status: Abnormal   Collection Time: 06/06/20  3:51 AM  Result Value Ref Range   Sodium 132 (L) 135 - 145 mmol/L   Potassium 4.0 3.5 - 5.1 mmol/L   Chloride 104 98 - 111 mmol/L   CO2 22 22 - 32 mmol/L   Glucose, Bld 120 (H) 70 - 99 mg/dL    Comment: Glucose reference range applies only to samples taken after fasting for at least 8 hours.   BUN 17 6 - 20 mg/dL   Creatinine, Ser 1.45 (H) 0.61 - 1.24 mg/dL   Calcium 8.5 (L) 8.9 - 10.3 mg/dL   Total Protein 6.3 (L) 6.5 - 8.1 g/dL   Albumin 3.4 (L) 3.5 - 5.0 g/dL   AST 32 15 - 41 U/L   ALT 24 0 - 44 U/L   Alkaline Phosphatase 61 38 - 126 U/L   Total Bilirubin 1.1 0.3 - 1.2 mg/dL   GFR calc non Af Amer >60 >60 mL/min   GFR calc  Af Amer >60 >60 mL/min   Anion gap 6 5 - 15    Comment:  Performed at [facility], [address] [place], [place] [address]  TSH     Status: None   Collection Time: 06/06/20  3:51 AM  Result Value Ref Range   TSH 2.787 0.350 - 4.500 uIU/mL    Comment: Performed by a 3rd Generation assay with a functional sensitivity of <=0.01 uIU/mL. Performed at [facility], [address] [place], [place] [address]   CK     Status: Abnormal   Collection Time: 06/06/20  3:51 AM  Result Value Ref Range   Total CK 801 (H) 49.0 - 397.0 U/L    Comment: Performed at [facility], [address] [place], [place] [address]  Glucose, capillary     Status: Abnormal   Collection Time: 06/06/20  7:41 AM  Result Value Ref Range   Glucose-Capillary 110 (H) 70 - 99 mg/dL    Comment: Glucose reference range applies only to samples taken after fasting for at least 8 hours.  Glucose, capillary     Status: Abnormal   Collection Time: 06/06/20 11:28 AM  Result Value Ref Range   Glucose-Capillary 106 (H) 70 - 99 mg/dL    Comment: Glucose reference range applies only to samples taken after fasting for at least 8 hours.  Fibrin derivatives D-Dimer ([facility] only)     Status: Abnormal   Collection Time: 06/06/20  1:11 PM  Result Value Ref Range   Fibrin derivatives D-dimer ([facility]) >7,500.00 (H) 0.00 - 499.00 ng/mL (FEU)    Comment: (NOTE) <> Exclusion of Venous Thromboembolism (VTE) - OUTPATIENT ONLY   (Emergency Department or [place])    0-499 ng/ml (FEU): With a low to intermediate pretest probability                      for VTE this test result excludes the diagnosis                      of VTE.   >499 ng/ml (FEU) : VTE not excluded; additional work up for VTE is                      required.  <> Testing on Inpatients and Evaluation of Disseminated Intravascular   Coagulation (DIC) Reference Range:   0-499 ng/ml (FEU) Performed at [facility], [address], [place], [place] [address]   Pathologist smear review     Status:  None   Collection Time: 06/06/20  1:11 PM  Result Value Ref Range   Path Review Blood smear is reviewed.     Comment: High normal WBC count, with unremarkable differential. Normocytic anemia. No increase in schistocytes. Thrombocytopenia, with rare platelet clumps. Reviewed by Kathi Simpers, M.D. Performed at [facility], [address] [place], [place] [address]   CBC with Differential/Platelet     Status: Abnormal   Collection Time: 06/06/20  1:11 PM  Result Value Ref Range   WBC 9.3 4.0 - 10.5 K/uL   RBC 4.11 (L) 4.22 - 5.81 MIL/uL   Hemoglobin 12.4 (L) 13.0 - 17.0 g/dL   HCT 35.2 (L) 39 - 52 %   MCV 85.6 80.0 - 100.0 fL   MCH 30.2 26.0 - 34.0 pg   MCHC 35.2 30.0 - 36.0 g/dL   RDW 12.1 11.5 - 15.5 %   Platelets 84 (L) 150 -  400 K/uL    Comment: Immature Platelet Fraction may be clinically indicated, consider ordering this additional test [id number]    nRBC 0.0 0.0 - 0.2 %   Neutrophils Relative % 69 %   Neutro Abs 6.6 1.7 - 7.7 K/uL   Lymphocytes Relative 18 %   Lymphs Abs 1.6 0.7 - 4.0 K/uL   Monocytes Relative 11 %   Monocytes Absolute 1.0 0 - 1 K/uL   Eosinophils Relative 1 %   Eosinophils Absolute 0.1 0 - 0 K/uL   Basophils Relative 0 %   Basophils Absolute 0.0 0 - 0 K/uL   Immature Granulocytes 1 %   Abs Immature Granulocytes 0.06 0.00 - 0.07 K/uL    Comment: Performed at [facility], [address] [place], [place] [address]  Heparin induced platelet Ab (HIT antibody)     Status: None   Collection Time: 06/06/20  1:11 PM  Result Value Ref Range   Heparin Induced Plt Ab 0.052 0.000 - 0.400 OD    Comment: (NOTE) Performed At: [facility] [address] [place], [place] [id number] Rush Farmer MD [id number]   Glucose, capillary     Status: Abnormal   Collection Time: 06/06/20  4:34 PM  Result Value Ref Range   Glucose-Capillary 121 (H) 70 - 99 mg/dL    Comment: Glucose reference range applies only to samples taken after  fasting for at least 8 hours.  Urinalysis, Complete w Microscopic     Status: Abnormal   Collection Time: 06/06/20  4:38 PM  Result Value Ref Range   Color, Urine STRAW (A) YELLOW   APPearance CLEAR (A) CLEAR   Specific Gravity, Urine 1.004 (L) 1.005 - 1.030   pH 6.0 5.0 - 8.0   Glucose, UA NEGATIVE NEGATIVE mg/dL   Hgb urine dipstick NEGATIVE NEGATIVE   Bilirubin Urine NEGATIVE NEGATIVE   Ketones, ur NEGATIVE NEGATIVE mg/dL   Protein, ur NEGATIVE NEGATIVE mg/dL   Nitrite NEGATIVE NEGATIVE   Leukocytes,Ua NEGATIVE NEGATIVE   RBC / HPF 0-5 0 - 5 RBC/hpf   WBC, UA 0-5 0 - 5 WBC/hpf   Bacteria, UA NONE SEEN NONE SEEN   Squamous Epithelial / LPF NONE SEEN 0 - 5    Comment: Performed at [facility], [place], [place] [address]  Protein Electro, Random Urine     Status: None   Collection Time: 06/06/20  4:38 PM  Result Value Ref Range   Total Protein, Urine <4.0 Not Estab. mg/dL   Albumin ELP, Urine 0.0 %    Comment: (NOTE) No urine protein electrophoretic pattern was observed after concentration. However, there is a detectable amount of protein by a quantitative spectrophotometric method.    Alpha-1-Globulin, U 0.0 %   Alpha-2-Globulin, U 0.0 %   Beta Globulin, U 0.0 %   Gamma Globulin, U 0.0 %   M Component, Ur Not Observed Not Observed %   Please Note: Comment     Comment: (NOTE) Protein electrophoresis scan will follow via computer, mail, or courier delivery. Performed At: [facility] [address] [place], [place] [id number] Rush Farmer MD [id number]   Creatinine, urine, random     Status: None   Collection Time: 06/06/20  4:38 PM  Result Value Ref Range   Creatinine, Urine 41 mg/dL    Comment: Performed at [facility], [address] [place], [place] [address]  Sodium, urine, random     Status: None   Collection Time: 06/06/20  4:38 PM  Result Value Ref Range   Sodium, Ur <10 mmol/L    Comment: Performed at  [facility], [address] [place], [place] [address]  Antithrombin III     Status: None   Collection Time: 06/06/20  8:00 PM  Result Value Ref Range   AntiThromb III Func 101 75 - 120 %    Comment: Performed at [facility] [address] [place], [place] [address]  Protein C activity     Status: None   Collection Time: 06/06/20  8:00 PM  Result Value Ref Range   Protein C Activity 99 73 - 180 %    Comment: (NOTE) Performed At: [facility] [address] [place], [place] [id number] Rush Farmer MD [id number]   Protein C, total     Status: None   Collection Time: 06/06/20  8:00 PM  Result Value Ref Range   Protein C, Total 94 60 - 150 %    Comment: (NOTE) Performed At: [facility] [place], [place] [id number] Rush Farmer MD [id number]   Protein S activity     Status: Abnormal   Collection Time: 06/06/20  8:00 PM  Result Value Ref Range   Protein S Activity 247 (H) 63 - 140 %    Comment: (NOTE) An elevated protein S activity is of no known clinical significance. Protein S activity may be falsely increased (masking an abnormal, low result) in patients receiving direct Xa inhibitor (e.g., rivaroxaban, apixaban, edoxaban) or a direct thrombin inhibitor (e.g., dabigatran) anticoagulant treatment due to assay interference by these drugs. Performed At: [facility] [place], [place] [id number] Rush Farmer MD HW:[id number]   Protein S, total     Status: None   Collection Time: 06/06/20  8:00 PM  Result Value Ref Range   Protein S Ag, Total 94 60 - 150 %    Comment: (NOTE) This test was developed and its performance characteristics determined by [facility]. It has not been cleared or approved by the Food and Drug Administration. Performed At: [facility] [place], [place] [id number] Rush Farmer MD PV:[id number]   Lupus anticoagulant panel     Status: Abnormal    Collection Time: 06/06/20  8:00 PM  Result Value Ref Range   PTT Lupus Anticoagulant 97.6 (H) 0.0 - 51.9 sec   DRVVT 105.5 (H) 0.0 - 47.0 sec   Lupus Anticoag Interp Comment:     Comment: (NOTE) No lupus anticoagulant was detected. Mixing studies suggest the presence of an inhibitor and this could represent a specific factor inhibitor, dabigatran (a direct thrombin inhibitor anticoagulant), or a direct Xa inhibitor anticoagulant therapy such as rivaroxaban, apixaban or edoxaban. As antibody titers may fluctuate with time, repeat testing may be indicated and ideally should be performed in the absence of anticoagulant therapy. Important Note: The results of LA testing are not valid for patients receiving anticoagulant therapy. This treatment does not, however, interfere with anticardiolipin and beta-2 glycoprotein 1 antibody testing. Performed At: [facility] [place], [place] [id number] Rush Farmer MD JQ:[id number]   Beta-2-glycoprotein i abs, IgG/M/A     Status: None   Collection Time: 06/06/20  8:00 PM  Result Value Ref Range   Beta-2 Glyco I IgG <9 0 - 20 GPI IgG units    Comment: (NOTE) The reference interval reflects a 3SD or 99th percentile interval, which is thought to represent a potentially clinically significant result in accordance with the  International Consensus Statement on the classification criteria for definitive antiphospholipid syndrome (APS). J Thromb Haem 2006;4:295-306.    Beta-2-Glycoprotein I IgM <9 0 - 32 GPI IgM units    Comment: (NOTE) The reference interval reflects a 3SD or 99th percentile interval, which is thought to represent a potentially clinically significant result in accordance with the International Consensus Statement on the classification criteria for definitive antiphospholipid syndrome (APS). J Thromb Haem 2006;4:295-306. Performed At: [facility] [place], [place] [id number] Rush Farmer MD IN:[id number]    Beta-2-Glycoprotein I IgA <9 0 - 25 GPI IgA units    Comment: (NOTE) The reference interval reflects a 3SD or 99th percentile interval, which is thought to represent a potentially clinically significant result in accordance with the International Consensus Statement on the classification criteria for definitive antiphospholipid syndrome (APS). J Thromb Haem 2006;4:295-306.   Homocysteine, serum     Status: None   Collection Time: 06/06/20  8:00 PM  Result Value Ref Range   Homocysteine 14.5 0.0 - 14.5 umol/L    Comment: (NOTE) Performed At: [facility] [address], [place] [id number] Rush Farmer MD HU:[id number]   Factor 5 leiden     Status: None   Collection Time: 06/06/20  8:00 PM  Result Value Ref Range   Recommendations-F5LEID: Comment     Comment: (NOTE) Result:  Negative (no mutation found) Factor V Leiden is a specific mutation (R506Q) in the factor V gene that is associated with an increased risk of venous thrombosis. Factor V Leiden is more resistant to inactivation by activated protein C.  As a result, factor V persists in the circulation leading to a mild hyper- coagulable state.  The Leiden mutation accounts for 90% - 95% of APC resistance.  Factor V Leiden has been reported in patients with deep vein thrombosis, pulmonary embolus, central retinal vein occlusion, cerebral sinus thrombosis and hepatic vein thrombosis. Other risk factors to be considered in the workup for venous thrombosis include the G20210A mutation in the factor II (prothrombin) gene, protein S and C deficiency, and antithrombin deficiencies. Anticardiolipin antibody and lupus anticoagulant analysis may be appropriate for certain patients, as well as homocysteine levels. Contact your local [facility] for information on how to order additi onal testing if desired. **Genetic counselors are available for health care providers to**  discuss results at  [phone] [phone]). Methodology: DNA analysis of the Factor V gene was performed by allele-specific PCR. The diagnostic sensitivity and specificity is >99% for both. Molecular-based testing is highly accurate, but as in any laboratory test, diagnostic errors may occur. All test results must be combined with clinical information for the most accurate interpretation. This test was developed and its performance characteristics determined by [facility]. It has not been cleared or approved by the Food and Drug Administration. References: Voelkerding K (1996).  Clin Lab Med [phone]. Allison Quarry, PhD, [facility] Ruben Reason, PhD, [facility] Ileene Hutchinson, PhD, [facility] Alfredo Bach, PhD, [facility] Norva Riffle, PhD, [facility] Earlean Polka PhD, [facility] Performed At: [facility] [place], [place] [id number] Katina Degree MDPhD RF:[id number]   Prothrombin gene mutation     Status: None   Collection Time: 06/06/20  8:00 PM  Result Value Ref Range   Recommendations-PTGENE: Comment     Comment: (NOTE) NEGATIVE No mutation identified. Comment: A point mutation (G20210A) in the factor II (prothrombin) gene is the second most common cause of inherited thrombophilia. The incidence of this mutation in the [place]  Caucasian population is about 2% and in the [place] American population it is approximately 0.5%. This mutation is rare in the [place] and Native American population. Being heterozygous for a prothrombin mutation increases the risk for developing venous thrombosis about 2 to 3 times above the general population risk. Being homozygous for the prothrombin gene mutation increases the relative risk for venous thrombosis further, although it is not yet known how much further the risk is increased. In women heterozygous for the prothrombin gene mutation, the use of estrogen containing oral contraceptives increases the relative risk of venous thrombosis about 16 times and the  risk of developing cerebral thrombosis is also significantly increased. In pregnancy the pr othrombin gene mutation increases risk for venous thrombosis and may increase risk for stillbirth, placental abruption, pre-eclampsia and fetal growth restriction. If the patient possesses two or more congenital or acquired thrombophilic risk factors, the risk for thrombosis may rise to more than the sum of the risk ratios for the individual mutations. This assay detects only the prothrombin G20210A mutation and does not measure genetic abnormalities elsewhere in the genome. Other thrombotic risk factors may be pursued through systematic clinical laboratory analysis. These factors include the R506Q (Leiden) mutation in the Factor V gene, plasma homocysteine levels, as well as testing for deficiencies of antithrombin III, protein C and protein S. Genetic Counselors are available for health care providers to discuss results at [phone] [phone]). Methodology: DNA analysis of the Factor II gene was performed by PCR amplification followed by restriction analysis. The di agnostic sensitivity is >99% for both. All the tests must be combined with clinical information for the most accurate interpretation. Molecular-based testing is highly accurate, but as in any laboratory test, diagnostic errors may occur. This test was developed and its performance characteristics determined by [facility]. It has not been cleared or approved by the Food and Drug Administration. Poort SR, et al. Blood. 1996; 57:9728-2060. Varga EA. Circulation. 2004; 156:F53-P94. Mervin Hack, et [place]; 19:700-703. Allison Quarry, PhD, [facility] Ruben Reason, PhD, [facility] Ileene Hutchinson, PhD, [facility] Alfredo Bach, PhD, [facility] Norva Riffle, PhD, [facility] Earlean Polka, PhD, [facility] Performed At: [facility] [address] [place], [place] [id number] Katina Degree MDPhD KH:[id number]     Cardiolipin antibodies, IgG, IgM, IgA     Status: None   Collection Time: 06/06/20  8:00 PM  Result Value Ref Range   Anticardiolipin IgG <9 0 - 14 GPL U/mL    Comment: (NOTE)                          Negative:              <15                          Indeterminate:     15 - 20                          Low-Med Positive: >20 - 80                          High Positive:         >80    Anticardiolipin IgM <9 0 - 12 MPL U/mL    Comment: (NOTE)  Negative:              <13                          Indeterminate:     13 - 20                          Low-Med Positive: >20 - 80                          High Positive:         >80    Anticardiolipin IgA <9 0 - 11 APL U/mL    Comment: (NOTE)                          Negative:              <12                          Indeterminate:     12 - 20                          Low-Med Positive: >20 - 80                          High Positive:         >80 Performed At: [facility] [place], [place] [id number] Rush Farmer MD WI:[id number]   APTT     Status: Abnormal   Collection Time: 06/06/20  8:00 PM  Result Value Ref Range   aPTT 70 (H) 24 - 36 seconds    Comment:        IF BASELINE aPTT IS ELEVATED, SUGGEST PATIENT RISK ASSESSMENT BE USED TO DETERMINE APPROPRIATE ANTICOAGULANT THERAPY. Performed at [facility], [address] [place], [place] [address]   PTT-LA Mix     Status: Abnormal   Collection Time: 06/06/20  8:00 PM  Result Value Ref Range   PTT-LA Mix 90.6 (H) 0.0 - 48.9 sec    Comment: (NOTE) Performed At: [facility] [place], [place] [id number] Rush Farmer MD LG:[id number]   Hexagonal Phase Phospholipid     Status: None   Collection Time: 06/06/20  8:00 PM  Result Value Ref Range   Hexagonal Phase Phospholipid 5 0 - 11 sec    Comment: (NOTE) Performed At: [facility] [place], [place] [id number] Rush Farmer  MD DJ:[id number]   dRVVT Mix     Status: Abnormal   Collection Time: 06/06/20  8:00 PM  Result Value Ref Range   dRVVT Mix 69.0 (H) 0.0 - 40.4 sec    Comment: (NOTE) Performed At: [facility] [place], [place] [id number] Rush Farmer MD OI:[id number]   dRVVT Confirm     Status: None   Collection Time: 06/06/20  8:00 PM  Result Value Ref Range   dRVVT Confirm 1.1 0.8 - 1.2 ratio    Comment: (NOTE) Performed At: [facility] [address] [place], [place] [id number] Rush Farmer MD QH:[id number]   Glucose, capillary     Status: Abnormal   Collection Time: 06/06/20  8:55 PM  Result Value Ref Range   Glucose-Capillary 150 (H) 70 - 99 mg/dL  Comment: Glucose reference range applies only to samples taken after fasting for at least 8 hours.   Comment 1 Notify RN    Comment 2 Document in Chart   APTT     Status: Abnormal   Collection Time: 06/06/20 10:46 PM  Result Value Ref Range   aPTT 79 (H) 24 - 36 seconds    Comment:        IF BASELINE aPTT IS ELEVATED, SUGGEST PATIENT RISK ASSESSMENT BE USED TO DETERMINE APPROPRIATE ANTICOAGULANT THERAPY. Performed at [facility], [address] [place], [place] [address]   CBC     Status: Abnormal   Collection Time: 06/07/20  4:39 AM  Result Value Ref Range   WBC 8.3 4.0 - 10.5 K/uL   RBC 3.84 (L) 4.22 - 5.81 MIL/uL   Hemoglobin 11.5 (L) 13.0 - 17.0 g/dL   HCT 34.3 (L) 39 - 52 %   MCV 89.3 80.0 - 100.0 fL   MCH 29.9 26.0 - 34.0 pg   MCHC 33.5 30.0 - 36.0 g/dL   RDW 11.9 11.5 - 15.5 %   Platelets 85 (L) 150 - 400 K/uL    Comment: Immature Platelet Fraction may be clinically indicated, consider ordering this additional test [id number]    nRBC 0.0 0.0 - 0.2 %    Comment: Performed at [facility], [address], [place], [place] [address]  Basic metabolic panel     Status: Abnormal   Collection Time: 06/07/20  4:39 AM  Result Value Ref Range   Sodium 134 (L) 135 - 145  mmol/L   Potassium 4.0 3.5 - 5.1 mmol/L   Chloride 104 98 - 111 mmol/L   CO2 25 22 - 32 mmol/L   Glucose, Bld 117 (H) 70 - 99 mg/dL    Comment: Glucose reference range applies only to samples taken after fasting for at least 8 hours.   BUN 14 6 - 20 mg/dL   Creatinine, Ser 1.11 0.61 - 1.24 mg/dL   Calcium 8.0 (L) 8.9 - 10.3 mg/dL   GFR calc non Af Amer >60 >60 mL/min   GFR calc Af Amer >60 >60 mL/min   Anion gap 5 5 - 15    Comment: Performed at [facility], [address] [place], [place] [address]  APTT     Status: Abnormal   Collection Time: 06/07/20  4:39 AM  Result Value Ref Range   aPTT 87 (H) 24 - 36 seconds    Comment:        IF BASELINE aPTT IS ELEVATED, SUGGEST PATIENT RISK ASSESSMENT BE USED TO DETERMINE APPROPRIATE ANTICOAGULANT THERAPY. Performed at [facility], [address] [place], [place] [address]   Glucose, capillary     Status: Abnormal   Collection Time: 06/07/20  7:31 AM  Result Value Ref Range   Glucose-Capillary 103 (H) 70 - 99 mg/dL    Comment: Glucose reference range applies only to samples taken after fasting for at least 8 hours.  Glucose, capillary     Status: Abnormal   Collection Time: 06/07/20 11:22 AM  Result Value Ref Range   Glucose-Capillary 123 (H) 70 - 99 mg/dL    Comment: Glucose reference range applies only to samples taken after fasting for at least 8 hours.   Comment 1 Notify RN    Comment 2 Document in Chart   Glucose, capillary     Status: Abnormal   Collection Time: 06/07/20  3:58 PM  Result Value Ref Range  Glucose-Capillary 111 (H) 70 - 99 mg/dL    Comment: Glucose reference range applies only to samples taken after fasting for at least 8 hours.   Comment 1 Notify RN    Comment 2 Document in Chart   QuantiFERON-TB Gold Plus     Status: None   Collection Time: 06/08/20  3:17 AM  Result Value Ref Range   QuantiFERON Incubation Incubation performed.    QuantiFERON-TB Gold Plus Negative Negative     Comment: (NOTE) Chemiluminescence immunoassay methodology Performed At: [facility] [place], [place] [id number] Rush Farmer MD OX:[id number]   APTT     Status: Abnormal   Collection Time: 06/08/20  3:17 AM  Result Value Ref Range   aPTT 92 (H) 24 - 36 seconds    Comment:        IF BASELINE aPTT IS ELEVATED, SUGGEST PATIENT RISK ASSESSMENT BE USED TO DETERMINE APPROPRIATE ANTICOAGULANT THERAPY. Performed at [facility], [address] [place], [place] [address]   CBC     Status: Abnormal   Collection Time: 06/08/20  3:17 AM  Result Value Ref Range   WBC 7.7 4.0 - 10.5 K/uL   RBC 3.66 (L) 4.22 - 5.81 MIL/uL   Hemoglobin 10.9 (L) 13.0 - 17.0 g/dL   HCT 32.1 (L) 39 - 52 %   MCV 87.7 80.0 - 100.0 fL   MCH 29.8 26.0 - 34.0 pg   MCHC 34.0 30.0 - 36.0 g/dL   RDW 11.9 11.5 - 15.5 %   Platelets 104 (L) 150 - 400 K/uL    Comment: Immature Platelet Fraction may be clinically indicated, consider ordering this additional test [id number]    nRBC 0.0 0.0 - 0.2 %    Comment: Performed at [facility], [address] [place], [place] [address]  Basic metabolic panel     Status: Abnormal   Collection Time: 06/08/20  3:17 AM  Result Value Ref Range   Sodium 135 135 - 145 mmol/L   Potassium 3.7 3.5 - 5.1 mmol/L   Chloride 106 98 - 111 mmol/L   CO2 25 22 - 32 mmol/L   Glucose, Bld 100 (H) 70 - 99 mg/dL    Comment: Glucose reference range applies only to samples taken after fasting for at least 8 hours.   BUN 12 6 - 20 mg/dL   Creatinine, Ser 1.22 0.61 - 1.24 mg/dL   Calcium 7.9 (L) 8.9 - 10.3 mg/dL   GFR calc non Af Amer >60 >60 mL/min   GFR calc Af Amer >60 >60 mL/min   Anion gap 4 (L) 5 - 15    Comment: Performed at [facility], [address] [place], [place] [address]  Lactate dehydrogenase     Status: Abnormal   Collection Time: 06/08/20  3:17 AM  Result Value Ref Range   LDH 206 (H) 98 - 192 U/L    Comment: Performed  at [facility], [address] [place], [place] [address]  Protime-INR     Status: Abnormal   Collection Time: 06/08/20  3:17 AM  Result Value Ref Range   Prothrombin Time 23.3 (H) 11.4 - 15.2 seconds   INR 2.2 (H) 0.8 - 1.2    Comment: (NOTE) INR goal varies based on device and disease states. Performed at [facility], [place], [place] [address]   Ronney Asters Plus     Status: None   Collection Time: 06/08/20  3:17 AM  Result  Value Ref Range   QuantiFERON Criteria Comment     Comment: (NOTE) The QuantiFERON-TB Gold Plus result is determined by subtracting the Nil value from either TB antigen (Ag) tube. The mitogen tube serves as a control for the test.    QuantiFERON TB1 Ag Value 0.03 IU/mL   QuantiFERON TB2 Ag Value 0.00 IU/mL   QuantiFERON Nil Value 0.00 IU/mL   QuantiFERON Mitogen Value >10.00 IU/mL    Comment: (NOTE) Performed At: [facility] [place], [place] [id number] Rush Farmer MD LN:[id number]   Glucose, capillary     Status: Abnormal   Collection Time: 06/08/20  7:18 AM  Result Value Ref Range   Glucose-Capillary 100 (H) 70 - 99 mg/dL    Comment: Glucose reference range applies only to samples taken after fasting for at least 8 hours.  APTT     Status: Abnormal   Collection Time: 06/08/20 10:16 AM  Result Value Ref Range   aPTT 81 (H) 24 - 36 seconds    Comment:        IF BASELINE aPTT IS ELEVATED, SUGGEST PATIENT RISK ASSESSMENT BE USED TO DETERMINE APPROPRIATE ANTICOAGULANT THERAPY. Performed at [facility], [address] [place], [place] [address]   Glucose, capillary     Status: None   Collection Time: 06/08/20 11:08 AM  Result Value Ref Range   Glucose-Capillary 96 70 - 99 mg/dL    Comment: Glucose reference range applies only to samples taken after fasting for at least 8 hours.  MRSA PCR Screening     Status: None   Collection Time: 06/08/20  3:25 PM   Specimen:  Nasopharyngeal  Result Value Ref Range   MRSA by PCR NEGATIVE NEGATIVE    Comment:        The GeneXpert MRSA Assay (FDA approved for NASAL specimens only), is one component of a comprehensive MRSA colonization surveillance program. It is not intended to diagnose MRSA infection nor to guide or monitor treatment for MRSA infections. Performed at [facility], [address] [place], [place] [address]   Glucose, capillary     Status: Abnormal   Collection Time: 06/08/20  3:28 PM  Result Value Ref Range   Glucose-Capillary 118 (H) 70 - 99 mg/dL    Comment: Glucose reference range applies only to samples taken after fasting for at least 8 hours.  Heparin level (unfractionated) every 6 hours x 4 post-procedure     Status: Abnormal   Collection Time: 06/08/20  3:40 PM  Result Value Ref Range   Heparin Unfractionated <0.10 (L) 0.30 - 0.70 IU/mL    Comment: (NOTE) If heparin results are below expected values, and patient dosage has  been confirmed, suggest follow up testing of antithrombin III levels. Performed at [facility], [address] [place], [place] [address]   CBC every 6 hours x 4 post-procedure     Status: Abnormal   Collection Time: 06/08/20  3:40 PM  Result Value Ref Range   WBC 12.7 (H) 4.0 - 10.5 K/uL   RBC 3.77 (L) 4.22 - 5.81 MIL/uL   Hemoglobin 11.2 (L) 13.0 - 17.0 g/dL   HCT 33.7 (L) 39 - 52 %   MCV 89.4 80.0 - 100.0 fL   MCH 29.7 26.0 - 34.0 pg   MCHC 33.2 30.0 - 36.0 g/dL   RDW 11.9 11.5 - 15.5 %   Platelets 97 (L) 150 - 400 K/uL    Comment: Immature Platelet Fraction may be clinically  indicated, consider ordering this additional test [id number] CONSISTENT WITH PREVIOUS RESULT    nRBC 0.0 0.0 - 0.2 %    Comment: Performed at [facility], [address] [place], [place] [address]  Fibrinogen every 6 hours x 4 post-procedure     Status: Abnormal   Collection Time: 06/08/20  3:40 PM  Result Value Ref Range   Fibrinogen  118 (L) 210 - 475 mg/dL    Comment: Performed at [facility], [place], [place] [address]  Heparin level (unfractionated) every 6 hours x 4 post-procedure     Status: Abnormal   Collection Time: 06/08/20  7:31 PM  Result Value Ref Range   Heparin Unfractionated <0.10 (L) 0.30 - 0.70 IU/mL    Comment: (NOTE) If heparin results are below expected values, and patient dosage has  been confirmed, suggest follow up testing of antithrombin III levels. Performed at [facility], [address] [place], [place] [address]   CBC every 6 hours x 4 post-procedure     Status: Abnormal   Collection Time: 06/08/20  7:31 PM  Result Value Ref Range   WBC 10.3 4.0 - 10.5 K/uL   RBC 3.36 (L) 4.22 - 5.81 MIL/uL   Hemoglobin 10.1 (L) 13.0 - 17.0 g/dL   HCT 29.7 (L) 39 - 52 %   MCV 88.4 80.0 - 100.0 fL   MCH 30.1 26.0 - 34.0 pg   MCHC 34.0 30.0 - 36.0 g/dL   RDW 11.9 11.5 - 15.5 %   Platelets 97 (L) 150 - 400 K/uL    Comment: Immature Platelet Fraction may be clinically indicated, consider ordering this additional test [id number] CONSISTENT WITH PREVIOUS RESULT    nRBC 0.2 0.0 - 0.2 %    Comment: Performed at [facility], [address] [place], [place] [address]  Fibrinogen every 6 hours x 4 post-procedure     Status: Abnormal   Collection Time: 06/08/20  7:31 PM  Result Value Ref Range   Fibrinogen 64 (LL) 210 - 475 mg/dL    Comment: CRITICAL RESULT CALLED TO, READ BACK BY AND VERIFIED WITH: SARAH BRIGHT _0  ON 06/08/20 SKL Performed at [facility], [address] [place], [place] [address]   Glucose, capillary     Status: Abnormal   Collection Time: 06/08/20 10:01 PM  Result Value Ref Range   Glucose-Capillary 116 (H) 70 - 99 mg/dL    Comment: Glucose reference range applies only to samples taken after fasting for at least 8 hours.  Heparin level (unfractionated) every 6 hours x 4 post-procedure     Status: Abnormal   Collection  Time: 06/09/20  1:21 AM  Result Value Ref Range   Heparin Unfractionated <0.10 (L) 0.30 - 0.70 IU/mL    Comment: (NOTE) If heparin results are below expected values, and patient dosage has  been confirmed, suggest follow up testing of antithrombin III levels. Performed at [facility], [address] [place], [place] [address]   CBC every 6 hours x 4 post-procedure     Status: Abnormal   Collection Time: 06/09/20  1:21 AM  Result Value Ref Range   WBC 7.6 4.0 - 10.5 K/uL   RBC 3.33 (L) 4.22 - 5.81 MIL/uL   Hemoglobin 10.2 (L) 13.0 - 17.0 g/dL   HCT 29.0 (L) 39 - 52 %   MCV 87.1 80.0 - 100.0 fL   MCH 30.6 26.0 - 34.0 pg   MCHC 35.2 30.0 - 36.0  g/dL   RDW 11.9 11.5 - 15.5 %   Platelets 94 (L) 150 - 400 K/uL    Comment: Immature Platelet Fraction may be clinically indicated, consider ordering this additional test [id number]    nRBC 0.0 0.0 - 0.2 %    Comment: Performed at [facility], [address] [place], [place] [address]  Fibrinogen every 6 hours x 4 post-procedure     Status: Abnormal   Collection Time: 06/09/20  1:21 AM  Result Value Ref Range   Fibrinogen 60 (LL) 210 - 475 mg/dL    Comment: CRITICAL RESULT CALLED TO, READ BACK BY AND VERIFIED WITH: [facility] BRIGHT AT 0416 ON 06/09/20 RWW Performed at [facility], [address], [place], [place] [address]   Basic metabolic panel     Status: Abnormal   Collection Time: 06/09/20  1:21 AM  Result Value Ref Range   Sodium 136 135 - 145 mmol/L   Potassium 3.6 3.5 - 5.1 mmol/L   Chloride 107 98 - 111 mmol/L   CO2 25 22 - 32 mmol/L   Glucose, Bld 106 (H) 70 - 99 mg/dL    Comment: Glucose reference range applies only to samples taken after fasting for at least 8 hours.   BUN 15 6 - 20 mg/dL   Creatinine, Ser 1.18 0.61 - 1.24 mg/dL   Calcium 7.6 (L) 8.9 - 10.3 mg/dL   GFR calc non Af Amer >60 >60 mL/min   GFR calc Af Amer >60 >60 mL/min   Anion gap 4 (L) 5 - 15    Comment: Performed at  [facility], [address] [place], [place] [address]  Glucose, capillary     Status: None   Collection Time: 06/09/20  7:16 AM  Result Value Ref Range   Glucose-Capillary 93 70 - 99 mg/dL    Comment: Glucose reference range applies only to samples taken after fasting for at least 8 hours.  Heparin level (unfractionated) every 6 hours x 4 post-procedure     Status: Abnormal   Collection Time: 06/09/20  7:32 AM  Result Value Ref Range   Heparin Unfractionated <0.10 (L) 0.30 - 0.70 IU/mL    Comment: RESULT REPEATED AND VERIFIED (NOTE) If heparin results are below expected values, and patient dosage has  been confirmed, suggest follow up testing of antithrombin III levels. Performed at [facility], [address] [place], [place] [address]   CBC every 6 hours x 4 post-procedure     Status: Abnormal   Collection Time: 06/09/20  7:32 AM  Result Value Ref Range   WBC 7.1 4.0 - 10.5 K/uL   RBC 3.15 (L) 4.22 - 5.81 MIL/uL   Hemoglobin 9.6 (L) 13.0 - 17.0 g/dL   HCT 27.8 (L) 39 - 52 %   MCV 88.3 80.0 - 100.0 fL   MCH 30.5 26.0 - 34.0 pg   MCHC 34.5 30.0 - 36.0 g/dL   RDW 12.1 11.5 - 15.5 %   Platelets 91 (L) 150 - 400 K/uL    Comment: Immature Platelet Fraction may be clinically indicated, consider ordering this additional test [id number]    nRBC 0.0 0.0 - 0.2 %    Comment: Performed at [facility], [address] [place], [place] [address]  Fibrinogen every 6 hours x 4 post-procedure     Status: Abnormal   Collection Time: 06/09/20  7:32 AM  Result Value Ref Range   Fibrinogen 73 (LL) 210 - 475 mg/dL  Comment: CRITICAL RESULT CALLED TO, READ BACK BY AND VERIFIED WITH: SABRINA ELLIOT 06/09/20 0822 KLW Performed at [facility], [address] [place], [place] [address]   Glucose, capillary     Status: Abnormal   Collection Time: 06/09/20 11:23 AM  Result Value Ref Range   Glucose-Capillary 108 (H) 70 - 99 mg/dL    Comment: Glucose  reference range applies only to samples taken after fasting for at least 8 hours.  APTT     Status: Abnormal   Collection Time: 06/09/20 12:41 PM  Result Value Ref Range   aPTT 53 (H) 24 - 36 seconds    Comment:        IF BASELINE aPTT IS ELEVATED, SUGGEST PATIENT RISK ASSESSMENT BE USED TO DETERMINE APPROPRIATE ANTICOAGULANT THERAPY. Performed at [facility], [address] [place], [place] [address]   IgG 4     Status: None   Collection Time: 06/09/20 12:41 PM  Result Value Ref Range   IgG, Subclass _0 - 96 mg/dL    Comment: (NOTE) Performed At: [facility] [place], [place] [id number] Rush Farmer MD [id number]   Anti-smooth muscle antibody, IgG     Status: None   Collection Time: 06/09/20 12:41 PM  Result Value Ref Range   F-Actin IgG 6 0 - 19 Units    Comment: (NOTE)                 Negative                     0 - 19                 Weak positive               20 - 30                 Moderate to strong positive     >30 Actin Antibodies are found in 52-85% of patients with autoimmune hepatitis or chronic active hepatitis and in 22% of patients with primary biliary cirrhosis. Performed At: [facility] [place], [place] [id number] Rush Farmer MD TA:[id number]   Thyroglobulin antibody     Status: None   Collection Time: 06/09/20 12:41 PM  Result Value Ref Range   Thyroglobulin Antibody <1.0 0.0 - 0.9 IU/mL    Comment: (NOTE) Thyroglobulin Antibody measured by [facility] Methodology Performed At: [facility] [place], [place] [id number] Rush Farmer MD [id number]   ANA     Status: Abnormal   Collection Time: 06/09/20 12:41 PM  Result Value Ref Range   Anti Nuclear Antibody (ANA) Positive (A) Negative    Comment: (NOTE) Performed At: [facility] [place], [place] [id number] Rush Farmer MD IT:[id number]   Mpo/pr-3 (anca) antibodies      Status: None   Collection Time: 06/09/20 12:41 PM  Result Value Ref Range   Myeloperoxidase Abs <9.0 0.0 - 9.0 U/mL   ANCA Proteinase 3 <3.5 0.0 - 3.5 U/mL    Comment: (NOTE) Performed At: [facility] [place], [place] [id number] Rush Farmer MD GS:[id number]   Kappa/lambda light chains     Status: Abnormal   Collection Time: 06/09/20 12:41 PM  Result Value Ref Range   Kappa free light chain 27.9 (H) 3.3 - 19.4 mg/L   Lamda free light chains 25.8 5.7 - 26.3 mg/L   Kappa, lamda light chain ratio  1.08 0.26 - 1.65    Comment: (NOTE) Performed At: [facility] [place], [place] [id number] Rush Farmer MD DE:[id number]   Protein electrophoresis, serum     Status: Abnormal   Collection Time: 06/09/20 12:41 PM  Result Value Ref Range   Total Protein ELP 5.3 (L) 6.0 - 8.5 g/dL   Albumin ELP 2.6 (L) 2.9 - 4.4 g/dL   Alpha-1-Globulin 0.5 (H) 0.0 - 0.4 g/dL   Alpha-2-Globulin 0.7 0.4 - 1.0 g/dL   Beta Globulin 1.0 0.7 - 1.3 g/dL   Gamma Globulin 0.6 0.4 - 1.8 g/dL   M-Spike, % Not Observed Not Observed g/dL   SPE Interp. Comment     Comment: (NOTE) The SPE pattern reflects hypoalbuminemia. Evidence of monoclonal protein is not apparent. Performed At: [facility] [place], [place] [id number] Rush Farmer MD ZC:[id number]    Comment Comment     Comment: (NOTE) Protein electrophoresis scan will follow via computer, mail, or courier delivery.    Globulin, Total 2.7 2.2 - 3.9 g/dL   A/G Ratio 1.0 0.7 - 1.7  APTT     Status: Abnormal   Collection Time: 06/09/20  2:58 PM  Result Value Ref Range   aPTT 49 (H) 24 - 36 seconds    Comment:        IF BASELINE aPTT IS ELEVATED, SUGGEST PATIENT RISK ASSESSMENT BE USED TO DETERMINE APPROPRIATE ANTICOAGULANT THERAPY. Performed at [facility], [address] [place], [place] [address]   Glucose, capillary     Status: Abnormal   Collection Time:  06/09/20  4:06 PM  Result Value Ref Range   Glucose-Capillary 109 (H) 70 - 99 mg/dL    Comment: Glucose reference range applies only to samples taken after fasting for at least 8 hours.  APTT     Status: Abnormal   Collection Time: 06/09/20  5:52 PM  Result Value Ref Range   aPTT 47 (H) 24 - 36 seconds    Comment:        IF BASELINE aPTT IS ELEVATED, SUGGEST PATIENT RISK ASSESSMENT BE USED TO DETERMINE APPROPRIATE ANTICOAGULANT THERAPY. Performed at [facility], [address] [place], [place] [address]   Histoplasma antigen, urine     Status: None   Collection Time: 06/09/20  6:40 PM  Result Value Ref Range   Histoplasma Antigen, urine <0.5 <0.5 ng/mL   Disclaimer: Comment     Comment: (NOTE) This test was developed and its performance characteristics determined by [facility]. It has not been cleared or approved by the Food and Drug Administration. Performed At: [facility] [place], [place] [id number] Rush Farmer MD [id number]   Elmarie Shiley Ag Ser     Status: None   Collection Time: 06/09/20  6:52 PM  Result Value Ref Range   Histoplasma Gal'mannan Ag Ser <0.5 <0.5 ng/mL   Disclaimer: Comment     Comment: (NOTE) This test was developed and its performance characteristics determined by [facility]. It has not been cleared or approved by the Food and Drug Administration. Performed At: [facility] [place], [place] [id number] Rush Farmer MD [id number]   Miscellaneous [facility] test (send-out)     Status: None   Collection Time: 06/09/20  6:52 PM  Result Value Ref Range   [facility] test code [id number]    [facility] test name FUNGAL ANTIBODIES     Comment: Performed at [facility], [address], [place], [place] [address]  Misc [facility] result COMMENT     Comment: (NOTE) Test Ordered: [id number] Fungal Antibodies, DID Aspergillus fumigatus          Negative                  BN     Reference Range:  Neg:<1:1                              Aspergillus flavus             Negative                  BN     Reference Range: Neg:<1:1                              Aspergillus [place]              Negative                  BN     Reference Range: Neg:<1:1                              Blastomyces Abs, Qn, DID       Negative                  BN     Reference Range: Neg:<1:1                              Performed At: [facility] [address] [place], [place] [id number] Rush Farmer MD JG:[id number]   Sedimentation rate     Status: Abnormal   Collection Time: 06/09/20  6:52 PM  Result Value Ref Range   Sed Rate 19 (H) 0 - 15 mm/hr    Comment: Performed at [facility], [address] [place], [place] [address]  C-reactive protein     Status: Abnormal   Collection Time: 06/09/20  6:52 PM  Result Value Ref Range   CRP 21.1 (H) <1.0 mg/dL    Comment: Performed at [facility], [place] [address] [place], [place] [address]  Glucose, capillary     Status: Abnormal   Collection Time: 06/09/20  7:31 PM  Result Value Ref Range   Glucose-Capillary 104 (H) 70 - 99 mg/dL    Comment: Glucose reference range applies only to samples taken after fasting for at least 8 hours.  APTT     Status: Abnormal   Collection Time: 06/09/20  9:46 PM  Result Value Ref Range   aPTT 44 (H) 24 - 36 seconds    Comment:        IF BASELINE aPTT IS ELEVATED, SUGGEST PATIENT RISK ASSESSMENT BE USED TO DETERMINE APPROPRIATE ANTICOAGULANT THERAPY. Performed at [facility], [address] [place], [place] [address]   Glucose, capillary     Status: Abnormal   Collection Time: 06/09/20 10:26 PM  Result Value Ref Range   Glucose-Capillary 106 (H) 70 - 99 mg/dL    Comment: Glucose reference range applies only to samples taken after fasting for at least 8 hours.  CBC     Status: Abnormal   Collection Time: 06/10/20  1:43 AM  Result Value Ref Range   WBC 7.4 4.0 - 10.5 K/uL   RBC 3.23 (L)  4.22 - 5.81 MIL/uL   Hemoglobin 9.8 (L) 13.0 - 17.0 g/dL   HCT 28.2 (L) 39 - 52 %   MCV 87.3 80.0 - 100.0 fL   MCH 30.3 26.0 - 34.0 pg   MCHC 34.8 30.0 - 36.0 g/dL   RDW 12.0 11.5 - 15.5 %   Platelets 99 (L) 150 - 400 K/uL    Comment: Immature Platelet Fraction may be clinically indicated, consider ordering this additional test [id number]    nRBC 0.0 0.0 - 0.2 %    Comment: Performed at [facility], [address] [place], [place] [address]  APTT     Status: Abnormal   Collection Time: 06/10/20  1:43 AM  Result Value Ref Range   aPTT 84 (H) 24 - 36 seconds    Comment:        IF BASELINE aPTT IS ELEVATED, SUGGEST PATIENT RISK ASSESSMENT BE USED TO DETERMINE APPROPRIATE ANTICOAGULANT THERAPY. Performed at [facility], [address] [place], [place] [address]   Glucose, capillary     Status: Abnormal   Collection Time: 06/10/20  3:59 AM  Result Value Ref Range   Glucose-Capillary 103 (H) 70 - 99 mg/dL    Comment: Glucose reference range applies only to samples taken after fasting for at least 8 hours.  APTT     Status: Abnormal   Collection Time: 06/10/20  4:11 AM  Result Value Ref Range   aPTT 84 (H) 24 - 36 seconds    Comment:        IF BASELINE aPTT IS ELEVATED, SUGGEST PATIENT RISK ASSESSMENT BE USED TO DETERMINE APPROPRIATE ANTICOAGULANT THERAPY. Performed at [facility], [address] [place], [place] [address]   APTT     Status: Abnormal   Collection Time: 06/10/20  1:02 PM  Result Value Ref Range   aPTT 67 (H) 24 - 36 seconds    Comment:        IF BASELINE aPTT IS ELEVATED, SUGGEST PATIENT RISK ASSESSMENT BE USED TO DETERMINE APPROPRIATE ANTICOAGULANT THERAPY. Performed at [facility], [address] [place], [place] [address]   Heparin induced platelet Ab (HIT antibody)     Status: None   Collection Time: 06/10/20  1:02 PM  Result Value Ref Range   Heparin Induced Plt Ab 0.094 0.000 - 0.400 OD    Comment:  (NOTE) Performed At: [facility] [place], [place] [id number] Rush Farmer MD IH:[id number]   APTT     Status: Abnormal   Collection Time: 06/10/20  2:59 PM  Result Value Ref Range   aPTT 77 (H) 24 - 36 seconds    Comment:        IF BASELINE aPTT IS ELEVATED, SUGGEST PATIENT RISK ASSESSMENT BE USED TO DETERMINE APPROPRIATE ANTICOAGULANT THERAPY. Performed at [facility], [address] [place], [place] [address]   Glucose, capillary     Status: Abnormal   Collection Time: 06/10/20  3:38 PM  Result Value Ref Range   Glucose-Capillary 100 (H) 70 - 99 mg/dL    Comment: Glucose reference range applies only to samples taken after fasting for at least 8 hours.  Glucose, capillary     Status: Abnormal   Collection Time: 06/10/20  7:54 PM  Result Value Ref Range   Glucose-Capillary 137 (H) 70 - 99 mg/dL    Comment: Glucose reference range applies only to samples taken after fasting for at least 8 hours.  Glucose, capillary  Status: Abnormal   Collection Time: 06/10/20 11:51 PM  Result Value Ref Range   Glucose-Capillary 108 (H) 70 - 99 mg/dL    Comment: Glucose reference range applies only to samples taken after fasting for at least 8 hours.  Glucose, capillary     Status: None   Collection Time: 06/11/20  3:53 AM  Result Value Ref Range   Glucose-Capillary 97 70 - 99 mg/dL    Comment: Glucose reference range applies only to samples taken after fasting for at least 8 hours.  CBC     Status: Abnormal   Collection Time: 06/11/20  5:13 AM  Result Value Ref Range   WBC 7.4 4.0 - 10.5 K/uL   RBC 3.11 (L) 4.22 - 5.81 MIL/uL   Hemoglobin 9.2 (L) 13.0 - 17.0 g/dL   HCT 27.4 (L) 39 - 52 %   MCV 88.1 80.0 - 100.0 fL   MCH 29.6 26.0 - 34.0 pg   MCHC 33.6 30.0 - 36.0 g/dL   RDW 11.6 11.5 - 15.5 %   Platelets 128 (L) 150 - 400 K/uL   nRBC 0.0 0.0 - 0.2 %    Comment: Performed at [facility], [address] [place], [place] [address]    Basic metabolic panel     Status: Abnormal   Collection Time: 06/11/20  5:13 AM  Result Value Ref Range   Sodium 139 135 - 145 mmol/L   Potassium 3.8 3.5 - 5.1 mmol/L   Chloride 107 98 - 111 mmol/L   CO2 25 22 - 32 mmol/L   Glucose, Bld 104 (H) 70 - 99 mg/dL    Comment: Glucose reference range applies only to samples taken after fasting for at least 8 hours.   BUN 10 6 - 20 mg/dL   Creatinine, Ser 1.12 0.61 - 1.24 mg/dL   Calcium 7.7 (L) 8.9 - 10.3 mg/dL   GFR calc non Af Amer >60 >60 mL/min   GFR calc Af Amer >60 >60 mL/min   Anion gap 7 5 - 15    Comment: Performed at [facility], [address], [place], [place] [address]  Magnesium     Status: None   Collection Time: 06/11/20  5:13 AM  Result Value Ref Range   Magnesium 2.1 1.7 - 2.4 mg/dL    Comment: Performed at [facility], [address] [place], [place] [address]  APTT     Status: Abnormal   Collection Time: 06/11/20  5:13 AM  Result Value Ref Range   aPTT 78 (H) 24 - 36 seconds    Comment:        IF BASELINE aPTT IS ELEVATED, SUGGEST PATIENT RISK ASSESSMENT BE USED TO DETERMINE APPROPRIATE ANTICOAGULANT THERAPY. Performed at [facility], [address] [place], [place] [address]   Multiple Myeloma Panel (SPEP&IFE w/QIG)     Status: Abnormal   Collection Time: 06/11/20  5:13 AM  Result Value Ref Range   IgG (Immunoglobin G), Serum 740 603 - 1,613 mg/dL   IgA 133 90 - 386 mg/dL   IgM (Immunoglobulin M), Srm 56 20 - 172 mg/dL   Total Protein ELP 5.3 (L) 6.0 - 8.5 g/dL   Albumin SerPl Elph-Mcnc 2.5 (L) 2.9 - 4.4 g/dL   Alpha 1 0.5 (H) 0.0 - 0.4 g/dL   Alpha2 Glob SerPl Elph-Mcnc 0.7 0.4 - 1.0 g/dL   B-Globulin SerPl Elph-Mcnc 0.9 0.7 - 1.3 g/dL   Gamma Glob SerPl Elph-Mcnc 0.7 0.4 - 1.8  g/dL   M Protein SerPl Elph-Mcnc Not Observed Not Observed g/dL   Globulin, Total 2.8 2.2 - 3.9 g/dL   Albumin/Glob SerPl 0.9 0.7 - 1.7   IFE 1 Comment     Comment: (NOTE) The immunofixation  pattern appears unremarkable. Evidence of monoclonal protein is not apparent.    Please Note Comment     Comment: (NOTE) Protein electrophoresis scan will follow via computer, mail, or courier delivery. Performed At: [facility] [place], [place] [id number] Rush Farmer MD [id number]   Fungitell, Serum     Status: None   Collection Time: 06/11/20  5:13 AM  Result Value Ref Range   Fungitell Result 54 <80 pg/mL    Comment: (NOTE) Interpretation: The Fungitell assay does not detect certain fungal species such as the genus Cryptococcus Baldomero Lamy et al. [phone]) which produces very low levels of (1-3)-Beta-D-Glucan. The assay also does not detect the Zygomycetes such as Absidia, Mucor and Rhizopus (C[place]u Addition) which are not known to produce (1-3)-Beta-D-Glucan. In addition, the yeast phase of Blastomyces dermatitidis produces little (1-3)-Beta-D-Glucan and may not be detected by the assay Charise Carwin et al. 2007). Reference Range: Less than 60 pg/mL. Glucan values of less than 60 pg/mL are interpreted as negative. Glucan values of 60 to 79 pg/mL are interpreted as indeterminate, and suggest a possible fungal infection. Additional sampling and testing of sera is required to interpret the results. Glucan values of greater than or equal to 80 pg/mL are interpreted as positive. Due to the potential for environmental contamination when transferred to pour-off tubes, which can lead to false  positive results, interpret positive results from samples provided in pour-off tubes with caution.  Results should be used in conjunction with clinical findings, and should not form the sole basis for a diagnosis or treatment decision. The Fungitell test is approved or cleared for in vitro diagnostic use by the [place] and Drug Administration. Modifications to the approved package insert have been made and the performance characteristics for these modifications  were determined by [facility]. If sample result is greater than 500 pg/mL, physician may order a titer of the sample. Please contact [facility] if you would like to order a retest of this sample to obtain an actual value. Samples are held for 1 week after initial testing date. Performed At: [facility] [place], [place] [id number] Caryl Ada PhD ZD:[id number]   ANA Comprehensive Panel     Status: Abnormal   Collection Time: 06/11/20  5:13 AM  Result Value Ref Range   ds DNA Ab 17 (H) 0 - 9 IU/mL    Comment: (NOTE)                                   Negative      <5                                   Equivocal  5 - 9                                   Positive      >9    Ribonucleic Protein <0.2 0.0 - 0.9 AI   ENA SM Ab Ser-aCnc <0.2 0.0 - 0.9  AI   Scleroderma (Scl-70) (ENA) Antibody, IgG <0.2 0.0 - 0.9 AI   SSA (Ro) (ENA) Antibody, IgG <0.2 0.0 - 0.9 AI   SSB (La) (ENA) Antibody, IgG <0.2 0.0 - 0.9 AI   Chromatin Ab SerPl-aCnc <0.2 0.0 - 0.9 AI   Anti JO-1 <0.2 0.0 - 0.9 AI   Centromere Ab Screen <0.2 0.0 - 0.9 AI   See below: Comment     Comment: (NOTE) Autoantibody                       Disease Association ------------------------------------------------------------                        Condition                  Frequency ---------------------   ------------------------   --------- Antinuclear Antibody,    SLE, mixed connective Direct (ANA-D)           tissue diseases ---------------------   ------------------------   --------- dsDNA                    SLE                        40 - 60% ---------------------   ------------------------   --------- Chromatin                Drug induced SLE                90%                         SLE                        48 - 97% ---------------------   ------------------------   --------- SSA (Ro)                 SLE                        25 - 35%                         Sjogren's  Syndrome         40 - 70%                         Neonatal Lupus                 100% ---------------------   ------------------------   --------- SSB (La)                 SLE                              10%                         Sjogren's Syndrome              30% ---------------------   -----------------------    --------- Sm (anti-Smith)          SLE                        15 - 30% ---------------------   -----------------------    ---------  RNP                      Mixed Connective Tissue                         Disease                         95% (U1 nRNP,                SLE                        30 - 50% anti-ribonucleoprotein)  Polymyositis and/or                         Dermatomyositis                 20% ---------------------   ------------------------   --------- Scl-70 (antiDNA          Scleroderma (diffuse)      20 - 35% topoisomerase)           Crest                           13% ---------------------   ------------------------   --------- Jo-1                     Polymyositis and/or                         Dermatomyositis            20 - 40% ---------------------   ------------------------   --------- Centromere B             Scleroderma -  Crest                         variant                         80% Performed At: [facility] [place], [place] [id number] Rush Farmer MD [id number]   Glucose, capillary     Status: None   Collection Time: 06/11/20  8:06 AM  Result Value Ref Range   Glucose-Capillary 87 70 - 99 mg/dL    Comment: Glucose reference range applies only to samples taken after fasting for at least 8 hours.  Glucose, capillary     Status: Abnormal   Collection Time: 06/11/20 12:09 PM  Result Value Ref Range   Glucose-Capillary 100 (H) 70 - 99 mg/dL    Comment: Glucose reference range applies only to samples taken after fasting for at least 8 hours.  Glucose, capillary     Status: None   Collection Time: 06/11/20  4:08 PM    Result Value Ref Range   Glucose-Capillary 91 70 - 99 mg/dL    Comment: Glucose reference range applies only to samples taken after fasting for at least 8 hours.  Heparin level (unfractionated)     Status: Abnormal   Collection Time: 06/11/20  4:56 PM  Result Value Ref Range   Heparin Unfractionated 0.21 (L) 0.30 - 0.70 IU/mL    Comment: (NOTE) If heparin results are below expected values, and patient dosage has  been confirmed, suggest follow up testing of antithrombin III  levels. Performed at [facility], [address] [place], [place] [address]   Glucose, capillary     Status: Abnormal   Collection Time: 06/11/20  7:48 PM  Result Value Ref Range   Glucose-Capillary 113 (H) 70 - 99 mg/dL    Comment: Glucose reference range applies only to samples taken after fasting for at least 8 hours.  Glucose, capillary     Status: None   Collection Time: 06/12/20 12:00 AM  Result Value Ref Range   Glucose-Capillary 97 70 - 99 mg/dL    Comment: Glucose reference range applies only to samples taken after fasting for at least 8 hours.  CBC     Status: Abnormal   Collection Time: 06/12/20  4:03 AM  Result Value Ref Range   WBC 7.6 4.0 - 10.5 K/uL   RBC 3.18 (L) 4.22 - 5.81 MIL/uL   Hemoglobin 9.4 (L) 13.0 - 17.0 g/dL   HCT 28.0 (L) 39 - 52 %   MCV 88.1 80.0 - 100.0 fL   MCH 29.6 26.0 - 34.0 pg   MCHC 33.6 30.0 - 36.0 g/dL   RDW 11.6 11.5 - 15.5 %   Platelets 150 150 - 400 K/uL   nRBC 0.3 (H) 0.0 - 0.2 %    Comment: Performed at [facility], [address] [place], [place] [address]  Heparin level (unfractionated)     Status: None   Collection Time: 06/12/20  4:03 AM  Result Value Ref Range   Heparin Unfractionated 0.54 0.30 - 0.70 IU/mL    Comment: (NOTE) If heparin results are below expected values, and patient dosage has  been confirmed, suggest follow up testing of antithrombin III levels. Performed at [facility], [address]  [place], [place] [address]   RPR     Status: None   Collection Time: 06/12/20  4:03 AM  Result Value Ref Range   RPR Ser Ql NON REACTIVE NON REACTIVE    Comment: Performed at [facility] [address] [place], [place] [address]  Heparin level (unfractionated)     Status: None   Collection Time: 06/12/20 10:15 AM  Result Value Ref Range   Heparin Unfractionated 0.61 0.30 - 0.70 IU/mL    Comment: (NOTE) If heparin results are below expected values, and patient dosage has  been confirmed, suggest follow up testing of antithrombin III levels. Performed at [facility], [address] [place], [place] [address]   Glucose, capillary     Status: None   Collection Time: 06/12/20  4:55 PM  Result Value Ref Range   Glucose-Capillary 91 70 - 99 mg/dL    Comment: Glucose reference range applies only to samples taken after fasting for at least 8 hours.  Heparin level (unfractionated)     Status: None   Collection Time: 06/13/20  4:48 AM  Result Value Ref Range   Heparin Unfractionated 0.64 0.30 - 0.70 IU/mL    Comment: (NOTE) If heparin results are below expected values, and patient dosage has  been confirmed, suggest follow up testing of antithrombin III levels. Performed at [facility], [address] [place], [place] [address]   CBC     Status: Abnormal   Collection Time: 06/13/20  4:48 AM  Result Value Ref Range   WBC 7.6 4.0 - 10.5 K/uL   RBC 3.53 (L) 4.22 - 5.81 MIL/uL   Hemoglobin 10.3 (L) 13.0 - 17.0 g/dL   HCT 31.1 (L) 39 - 52 %  MCV 88.1 80.0 - 100.0 fL   MCH 29.2 26.0 - 34.0 pg   MCHC 33.1 30.0 - 36.0 g/dL   RDW 11.9 11.5 - 15.5 %   Platelets 198 150 - 400 K/uL   nRBC 0.4 (H) 0.0 - 0.2 %    Comment: Performed at [facility], [address] [place], [place] [address]  Glucose, capillary     Status: None   Collection Time: 06/13/20  7:57 AM  Result Value Ref Range   Glucose-Capillary 93 70 - 99 mg/dL    Comment: Glucose reference  range applies only to samples taken after fasting for at least 8 hours.  Glucose, capillary     Status: None   Collection Time: 06/13/20 11:43 AM  Result Value Ref Range   Glucose-Capillary 88 70 - 99 mg/dL    Comment: Glucose reference range applies only to samples taken after fasting for at least 8 hours.  Glucose, capillary     Status: None   Collection Time: 06/13/20  3:52 PM  Result Value Ref Range   Glucose-Capillary 97 70 - 99 mg/dL    Comment: Glucose reference range applies only to samples taken after fasting for at least 8 hours.  Glucose, capillary     Status: None   Collection Time: 06/13/20  9:50 PM  Result Value Ref Range   Glucose-Capillary 95 70 - 99 mg/dL    Comment: Glucose reference range applies only to samples taken after fasting for at least 8 hours.   Comment 1 Notify RN   Heparin level (unfractionated)     Status: None   Collection Time: 06/14/20  4:43 AM  Result Value Ref Range   Heparin Unfractionated 0.66 0.30 - 0.70 IU/mL    Comment: (NOTE) If heparin results are below expected values, and patient dosage has  been confirmed, suggest follow up testing of antithrombin III levels. Performed at [facility], [address] [place], [place] [address]   CBC     Status: Abnormal   Collection Time: 06/14/20  4:43 AM  Result Value Ref Range   WBC 8.0 4.0 - 10.5 K/uL   RBC 3.52 (L) 4.22 - 5.81 MIL/uL   Hemoglobin 10.3 (L) 13.0 - 17.0 g/dL   HCT 31.2 (L) 39 - 52 %   MCV 88.6 80.0 - 100.0 fL   MCH 29.3 26.0 - 34.0 pg   MCHC 33.0 30.0 - 36.0 g/dL   RDW 11.8 11.5 - 15.5 %   Platelets 234 150 - 400 K/uL   nRBC 0.5 (H) 0.0 - 0.2 %    Comment: Performed at [facility], [address] [place], [place] [address]  Glucose, capillary     Status: None   Collection Time: 06/14/20  7:48 AM  Result Value Ref Range   Glucose-Capillary 93 70 - 99 mg/dL    Comment: Glucose reference range applies only to samples taken after fasting for at least 8  hours.  Glucose, capillary     Status: Abnormal   Collection Time: 06/14/20 11:41 AM  Result Value Ref Range   Glucose-Capillary 155 (H) 70 - 99 mg/dL    Comment: Glucose reference range applies only to samples taken after fasting for at least 8 hours.  Glucose, capillary     Status: None   Collection Time: 06/14/20  4:20 PM  Result Value Ref Range   Glucose-Capillary 96 70 - 99 mg/dL    Comment: Glucose reference range applies only to samples taken  after fasting for at least 8 hours.  Glucose, capillary     Status: None   Collection Time: 06/14/20 10:02 PM  Result Value Ref Range   Glucose-Capillary 90 70 - 99 mg/dL    Comment: Glucose reference range applies only to samples taken after fasting for at least 8 hours.  Heparin level (unfractionated)     Status: None   Collection Time: 06/15/20  5:00 AM  Result Value Ref Range   Heparin Unfractionated 0.55 0.30 - 0.70 IU/mL    Comment: (NOTE) If heparin results are below expected values, and patient dosage has  been confirmed, suggest follow up testing of antithrombin III levels. Performed at [facility], [address] [place], [place] [address]   CBC     Status: Abnormal   Collection Time: 06/15/20  5:00 AM  Result Value Ref Range   WBC 7.8 4.0 - 10.5 K/uL   RBC 3.61 (L) 4.22 - 5.81 MIL/uL   Hemoglobin 10.6 (L) 13.0 - 17.0 g/dL   HCT 32.1 (L) 39 - 52 %   MCV 88.9 80.0 - 100.0 fL   MCH 29.4 26.0 - 34.0 pg   MCHC 33.0 30.0 - 36.0 g/dL   RDW 11.7 11.5 - 15.5 %   Platelets 260 150 - 400 K/uL   nRBC 0.0 0.0 - 0.2 %    Comment: Performed at [facility], [address] [place], [place] [address]  Glucose, capillary     Status: None   Collection Time: 06/15/20  7:25 AM  Result Value Ref Range   Glucose-Capillary 92 70 - 99 mg/dL    Comment: Glucose reference range applies only to samples taken after fasting for at least 8 hours.  Glucose, capillary     Status: None   Collection Time: 06/15/20 11:38 AM    Result Value Ref Range   Glucose-Capillary 88 70 - 99 mg/dL    Comment: Glucose reference range applies only to samples taken after fasting for at least 8 hours.  Glucose, capillary     Status: None   Collection Time: 06/15/20  4:37 PM  Result Value Ref Range   Glucose-Capillary 98 70 - 99 mg/dL    Comment: Glucose reference range applies only to samples taken after fasting for at least 8 hours.  Glucose, capillary     Status: Abnormal   Collection Time: 06/15/20  9:13 PM  Result Value Ref Range   Glucose-Capillary 102 (H) 70 - 99 mg/dL    Comment: Glucose reference range applies only to samples taken after fasting for at least 8 hours.   Comment 1 Notify RN   Heparin level (unfractionated)     Status: Abnormal   Collection Time: 06/16/20  5:12 AM  Result Value Ref Range   Heparin Unfractionated 0.92 (H) 0.30 - 0.70 IU/mL    Comment: (NOTE) If heparin results are below expected values, and patient dosage has  been confirmed, suggest follow up testing of antithrombin III levels. Performed at [facility], [address] [place], [place] [address]   CBC     Status: Abnormal   Collection Time: 06/16/20  5:12 AM  Result Value Ref Range   WBC 8.7 4.0 - 10.5 K/uL   RBC 3.76 (L) 4.22 - 5.81 MIL/uL   Hemoglobin 11.2 (L) 13.0 - 17.0 g/dL   HCT 32.5 (L) 39 - 52 %   MCV 86.4 80.0 - 100.0 fL   MCH 29.8 26.0 - 34.0 pg   MCHC  34.5 30.0 - 36.0 g/dL   RDW 11.9 11.5 - 15.5 %   Platelets 286 150 - 400 K/uL   nRBC 0.2 0.0 - 0.2 %    Comment: Performed at [facility], [address] [place], [place] [address]  Creatinine, serum     Status: None   Collection Time: 06/16/20  5:12 AM  Result Value Ref Range   Creatinine, Ser 1.23 0.61 - 1.24 mg/dL   GFR calc non Af Amer >60 >60 mL/min   GFR calc Af Amer >60 >60 mL/min    Comment: Performed at [facility], [address] [place],  [address]  Glucose, capillary     Status: None   Collection Time:  06/16/20  8:12 AM  Result Value Ref Range   Glucose-Capillary 97 70 - 99 mg/dL    Comment: Glucose reference range applies only to samples taken after fasting for at least 8 hours.  Glucose, capillary     Status: Abnormal   Collection Time: 06/16/20 11:49 AM  Result Value Ref Range   Glucose-Capillary 111 (H) 70 - 99 mg/dL    Comment: Glucose reference range applies only to samples taken after fasting for at least 8 hours.    Radiology US SCROTUM W/DOPPLER  Result Date: 07/22/2020 CLINICAL DATA:  Retroperitoneal mass, presumed retroperitoneal fibrosis. EXAM: SCROTAL ULTRASOUND DOPPLER ULTRASOUND OF THE TESTICLES TECHNIQUE: Complete ultrasound examination of the testicles, epididymis, and other scrotal structures was performed. Color and spectral Doppler ultrasound were also utilized to evaluate blood flow to the testicles. COMPARISON:  CT imaging 06/04/2020, MR 06/06/2020 FINDINGS: Right testicle Measurements: 4.7 x 2.4 x 2.9 cm. Normal parenchymal echogenicity. No testicular mass or microlithiasis. Left testicle Measurements: 4.6 x 2.4 x 3.3 cm. Normal parenchymal echogenicity. No testicular mass or microlithiasis. Right epididymis:  Normal in size and appearance. Left epididymis:  Normal in size and appearance. Hydrocele:  None visualized. Varicocele:  None visualized. Pulsed Doppler interrogation of both testes demonstrates normal low resistance arterial and venous waveforms bilaterally. IMPRESSION: No evidence of testicular mass. No features of testicular torsion with a normal Doppler evaluation. Electronically Signed   By: Lovena Le M.D.   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  On steroids.  AKI (acute kidney injury) ([place]) Renal function returned to normal prior to discharge  Leg DVT (deep venous thromboembolism), acute, bilateral ([place]) His duplex today shows his iliac veins and IVC to be widely patent with no evidence  of thrombus or stenosis today.  Doing great after intervention.  Legs have no swelling.  He will remain on anticoagulation for at least 6 months and potentially longer particular with his retroperitoneal fibrosis.  I will plan to see him back in about 3 to 4 months with duplex.    Leotis Pain, MD  07/24/2020 11:25 AM    This note was created with Dragon medical transcription system.  Any errors from dictation are purely unintentional

## 2020-07-24 NOTE — Telephone Encounter (Signed)
-----   Message from Sondra Come, MD sent at 07/23/2020  4:37 PM EDT ----- No abnormalities on scrotal ultrasound, follow-up with urology as needed  Legrand Rams, MD 07/23/2020

## 2020-07-24 NOTE — Assessment & Plan Note (Signed)
Seeing rheumatology next week.  On steroids.

## 2020-07-24 NOTE — Telephone Encounter (Signed)
Called pt using interpreter services, pt made aware of information below and gave verbal understanding.

## 2020-07-24 NOTE — Assessment & Plan Note (Signed)
His duplex today shows his iliac veins and IVC to be widely patent with no evidence of thrombus or stenosis today.  Doing great after intervention.  Legs have no swelling.  He will remain on anticoagulation for at least 6 months and potentially longer particular with his retroperitoneal fibrosis.  I will plan to see him back in about 3 to 4 months with duplex.

## 2020-07-24 NOTE — Assessment & Plan Note (Signed)
Renal function returned to normal prior to discharge

## 2020-07-30 ENCOUNTER — Ambulatory Visit: Payer: Self-pay | Admitting: Pharmacist

## 2020-07-30 ENCOUNTER — Other Ambulatory Visit: Payer: Self-pay

## 2020-07-30 DIAGNOSIS — Z79899 Other long term (current) drug therapy: Secondary | ICD-10-CM

## 2020-07-30 NOTE — Progress Notes (Signed)
  Medication Management Clinic Visit Note  Patient: Austin [place] MRN: [id number] Date of Birth: [birth year] PCP: Patient, No Pcp Per   Deetta Perla 39 y.o. male in good spirits who presents for initial MTM visit today.  Patient identity verified with address and DOB.  Patient reports being healthy with no medical history until July when he underwent thrombectomy for DVT and was also diagnosed with retroperitoneal fibrosis.  There were no vitals taken for this visit.  Patient Information  History reviewed. No pertinent past medical history.    Past Surgical History:  Procedure Laterality Date  . LOWER EXTREMITY ANGIOGRAPHY Right 06/04/2020   Procedure: Lower Extremity Angiography;  Surgeon: Annice Needy, MD;  Location: [facility] INVASIVE CV LAB;  Service: Cardiovascular;  Laterality: Right;  . PERIPHERAL VASCULAR THROMBECTOMY Bilateral 06/08/2020   Procedure: PERIPHERAL VASCULAR THROMBECTOMY, BLE and IVC;  Surgeon: Annice Needy, MD;  Location: [facility] INVASIVE CV LAB;  Service: Cardiovascular;  Laterality: Bilateral;  . PERIPHERAL VASCULAR THROMBECTOMY Bilateral 06/10/2020   Procedure: PERIPHERAL VASCULAR THROMBECTOMY;  Surgeon: Annice Needy, MD;  Location: [facility] INVASIVE CV LAB;  Service: Cardiovascular;  Laterality: Bilateral;  . TONSILLECTOMY      History reviewed. No pertinent family history.  New Diagnoses (since last visit):   Family Support: Good   Social History   Substance and Sexual Activity  Alcohol Use Yes   Comment: occasional      Social History   Tobacco Use  Smoking Status Never Smoker  Smokeless Tobacco Never Used      Health Maintenance  Topic Date Due  . Hepatitis C Screening  Never done  . TETANUS/TDAP  Never done  . INFLUENZA VACCINE  Never done  . COVID-19 Vaccine  Completed  . HIV Screening  Completed     Assessment and Plan:  Last ED visit:  July  Last Visit to PCP:   None  Next Visit to PCP:  None  Specialist Visit:  Rheumatologist tomorrow   Dental Exam:  This month hopefully  Eye Exam:  ?  Prostate Exam:  No    Flu Vaccine:  Yes  COVID Vaccine:  2nd dose (April or May).  Pfizer   Patient declined assistance of language interpreter for this encounter.  Adherence:  Patient seems very compliant with medications, and reports using Eliquis strictly at 7AM and 8PM every day.  Post-Thrombectomy:  On Eliquis.  Patient admitted to [facility] in July 2021 with DVT, and following with Dr. Wyn Quaker.  Currently on Eliquis 5 mg BID.  We discussed signs and symptoms of bleeding so patient can self monitor.  We also addressed warning signs of any additional DVT (leg pain, swelling) or potential pulmonary embolism (e.g., unprovoked SOB).  Patient drops off additional prescription for Eliquis today, and understands the importance of not running out of this medication.  Retroperitoneal Fibrosis:  On prednisone.  Patient currently following with rheumatologist tomorrow 07/31/20.  Patient reports taking prednisone with food daily.  No PCP:  Recommended patient reach out to [facility] or [facility] in lieu of new medical complications.  He receives refills for Eliquis from Dr. Wyn Quaker.  Follow Up:  1 year  Cherly Hensen, PharmD 07/30/2020 9:54 AM

## 2020-08-07 ENCOUNTER — Telehealth: Payer: Self-pay | Admitting: Pharmacist

## 2020-08-07 NOTE — Telephone Encounter (Signed)
08/07/2020 1:56:26 PM - Eliquis faxed to [employer]  -- Rhetta Mura - Friday, August 07, 2020 1:55 PM --Faxed [employer] application for Eliquis 5mg  Take 1 tablet by mouth 2 times a day.

## 2020-08-27 ENCOUNTER — Telehealth: Payer: Self-pay | Admitting: Pharmacist

## 2020-08-27 NOTE — Telephone Encounter (Signed)
08/27/2020 10:29:32 AM - Call to [employer] on denial-reprocessing  -- Annette Johnson - Thursday, August 27, 2020 10:27 AM --I received a denial from [employer] "Financial Criteria Not Met". I called [employer] spoke with Lorie S--she stating patient income per taxes are over income limit. I explained I sent a letter and patient also--she reviewed and is sending for 2nd determination review. She will call me back if a letter of medical necessity is needed. 

## 2020-09-17 ENCOUNTER — Other Ambulatory Visit: Payer: Self-pay | Admitting: Rheumatology

## 2020-09-17 DIAGNOSIS — N135 Crossing vessel and stricture of ureter without hydronephrosis: Secondary | ICD-10-CM

## 2020-09-17 DIAGNOSIS — R1900 Intra-abdominal and pelvic swelling, mass and lump, unspecified site: Secondary | ICD-10-CM

## 2020-10-05 ENCOUNTER — Other Ambulatory Visit: Payer: Self-pay

## 2020-10-05 ENCOUNTER — Ambulatory Visit
Admission: RE | Admit: 2020-10-05 | Discharge: 2020-10-05 | Disposition: A | Discharge status: Home / Self Care | Payer: Self-pay | Source: Ambulatory Visit | Attending: Rheumatology | Admitting: Rheumatology

## 2020-10-05 DIAGNOSIS — N135 Crossing vessel and stricture of ureter without hydronephrosis: Secondary | ICD-10-CM | POA: Insufficient documentation

## 2020-10-05 DIAGNOSIS — R1900 Intra-abdominal and pelvic swelling, mass and lump, unspecified site: Secondary | ICD-10-CM | POA: Insufficient documentation

## 2020-10-05 IMAGING — MR MR ABDOMEN WO/W CM
16 series · 48 of 48 positions shown · IV contrast (gadavist)
Comparison: Abdominal MRI [DATE].

CLINICAL DATA: 39-year-old male with history of bilateral common
iliac vein and external iliac vein deep venous thrombosis.
Retroperitoneal fibrosis.

EXAM:
MRI ABDOMEN WITHOUT AND WITH CONTRAST
TECHNIQUE: Multiplanar multisequence MR imaging of the abdomen was performed
both before and after the administration of intravenous contrast.
CONTRAST:  9mL GADAVIST GADOBUTROL 1 MMOL/ML IV SOLN

[Series 5: cor haste · coronal · 6.0mm · 1.19mm/px · 2 of 34 slices shown]
[im 1/34]
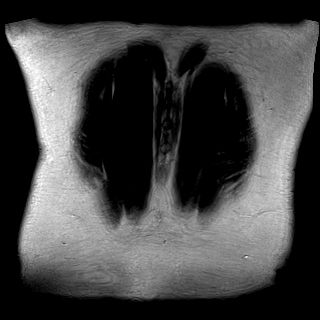
[im 34/34]
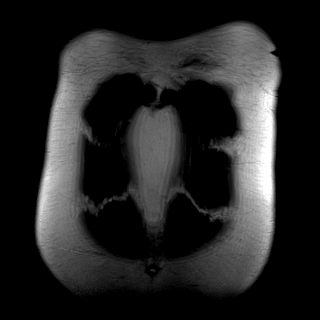

[Series 7: T2 · axial · 6.0mm · 1.19mm/px · z∈[-133,+133]mm · 2 of 38 slices shown]
[im 1/38]
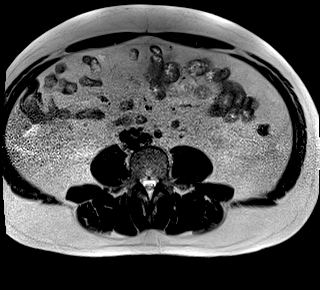
[im 38/38]
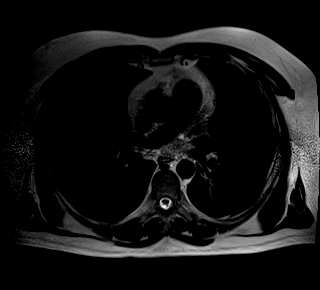

[Series 10: T2 fat-sat · axial · 6.0mm · 1.19mm/px · z∈[-133,+133]mm · 2 of 38 slices shown]
[im 1/38]
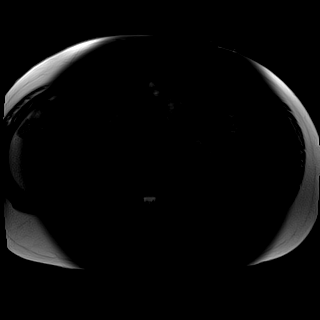
[im 38/38]
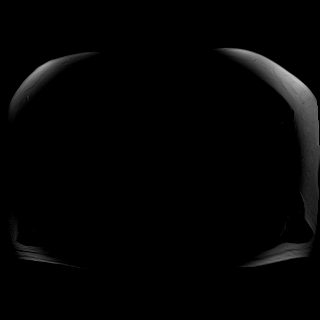

[Series 11: DWI · axial · 6.0mm · 1.42mm/px · z∈[-140,+140]mm · 5 of 120 slices shown (1 of 2)]
[im 1/120]
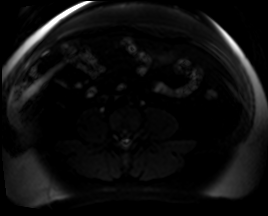
[im 30/120]
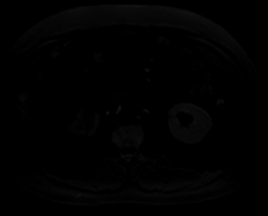
[im 60/120]
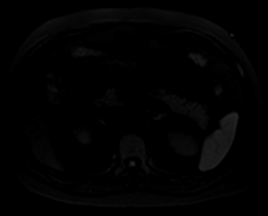
[im 90/120]
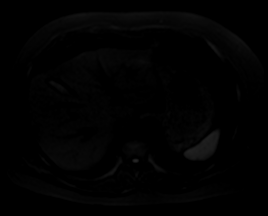
[im 120/120]
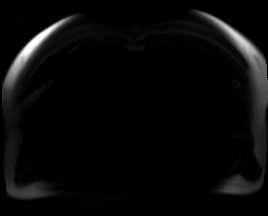

[Series 12: DWI · axial · 6.0mm · 1.42mm/px · z∈[-140,+140]mm · 2 of 40 slices shown (2 of 2)]
[im 1/40]
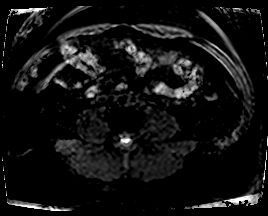
[im 40/40]
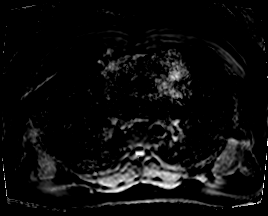

[Series 13: ax in & · axial · 3.5mm · 1.19mm/px · z∈[-138,+138]mm · 6 of 160 slices shown]
[im 1/160]
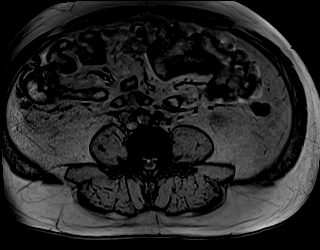
[im 32/160]
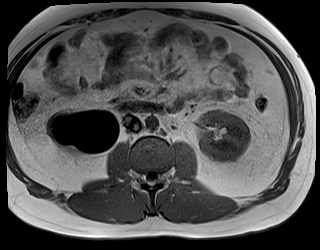
[im 64/160]
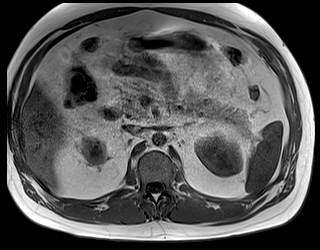
[im 96/160]
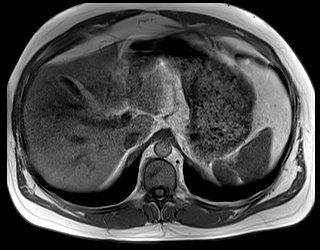
[im 128/160]
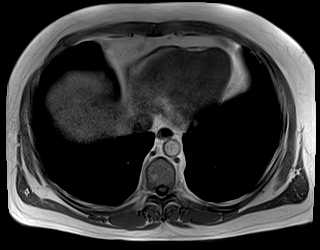
[im 160/160]
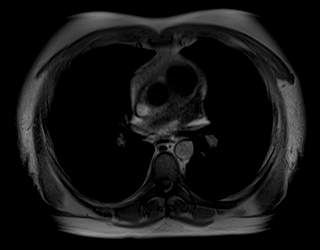

[Series 14: bSSFP · axial · 6.0mm · 0.74mm/px · z∈[-140,+140]mm · 2 of 40 slices shown]
[im 1/40]
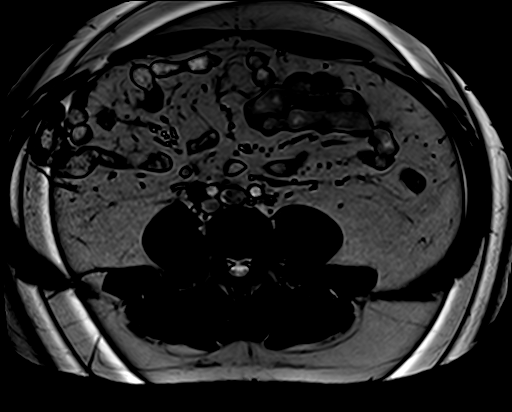
[im 40/40]
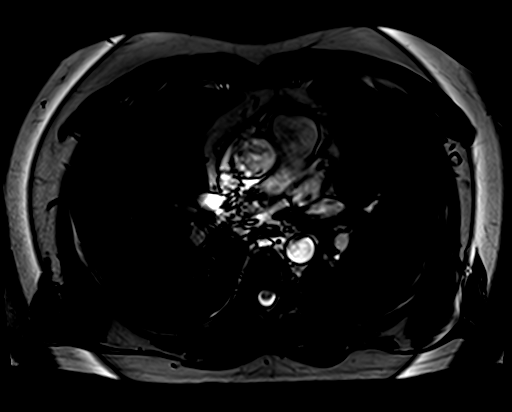

[Series 15: T1 dynamic · axial · non-contrast · 3.5mm · 1.19mm/px · z∈[-138,+138]mm · 3 of 80 slices shown (1 of 8)]
[im 1/80]
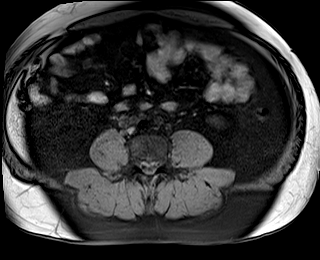
[im 40/80]
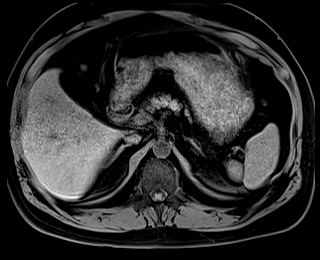
[im 80/80]
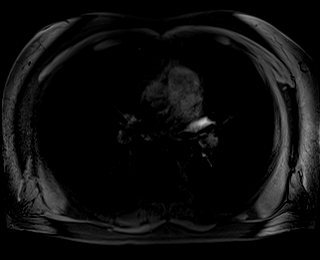

[Series 16: T1 dynamic · axial · 3.5mm · 1.19mm/px · z∈[-138,+138]mm · 3 of 80 slices shown (2 of 8)]
[im 1/80]
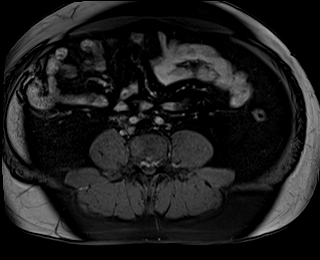
[im 40/80]
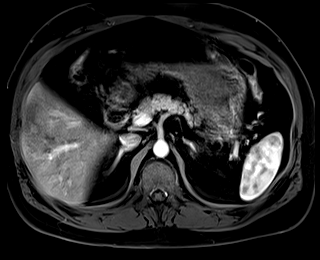
[im 80/80]
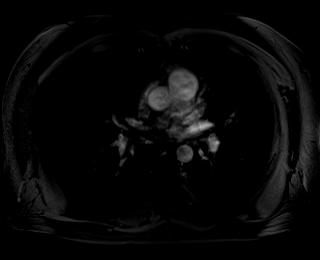

[Series 17: T1 dynamic · axial · 3.5mm · 1.19mm/px · z∈[-138,+138]mm · 3 of 80 slices shown (3 of 8)]
[im 1/80]
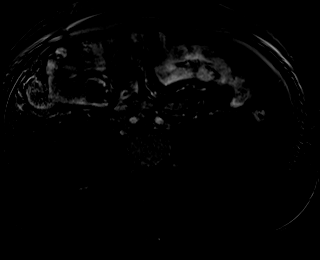
[im 40/80]
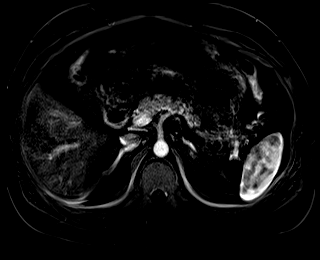
[im 80/80]
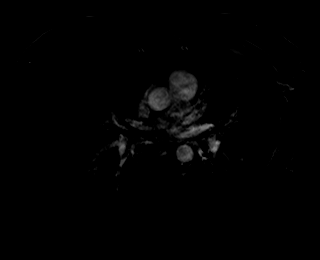

[Series 18: T1 dynamic · axial · 3.5mm · 1.19mm/px · z∈[-138,+138]mm · 3 of 80 slices shown (4 of 8)]
[im 1/80]
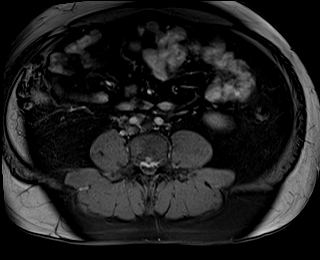
[im 40/80]
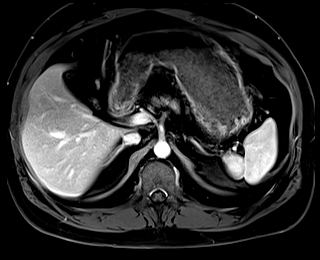
[im 80/80]
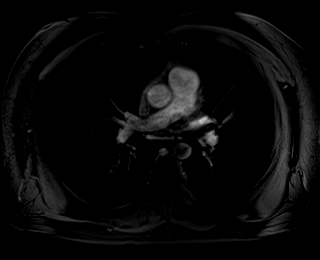

[Series 19: T1 dynamic · axial · 3.5mm · 1.19mm/px · z∈[-138,+138]mm · 3 of 80 slices shown (5 of 8)]
[im 1/80]
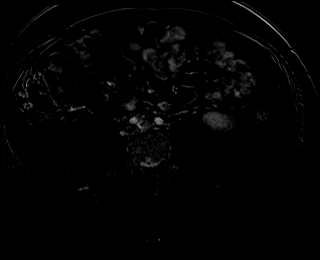
[im 40/80]
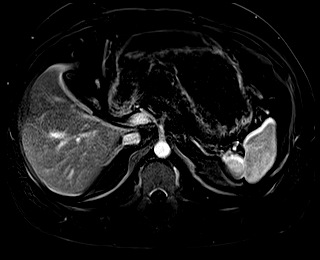
[im 80/80]
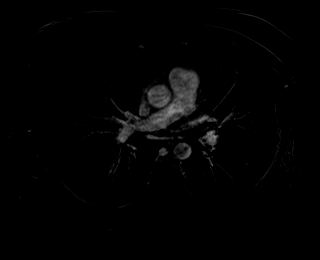

[Series 20: T1 dynamic · axial · 3.5mm · 1.19mm/px · z∈[-138,+138]mm · 3 of 80 slices shown (6 of 8)]
[im 1/80]
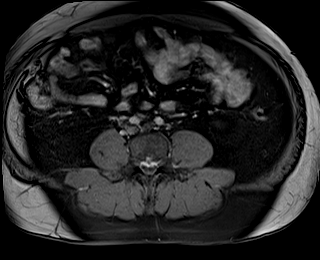
[im 40/80]
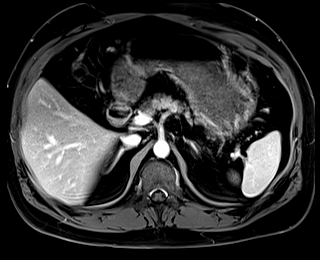
[im 80/80]
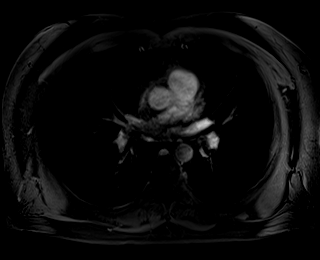

[Series 21: T1 dynamic · axial · 3.5mm · 1.19mm/px · z∈[-138,+138]mm · 3 of 80 slices shown (7 of 8)]
[im 1/80]
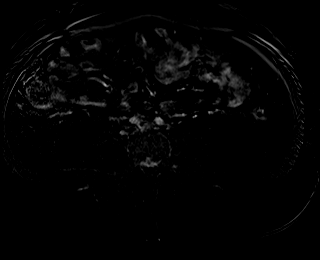
[im 40/80]
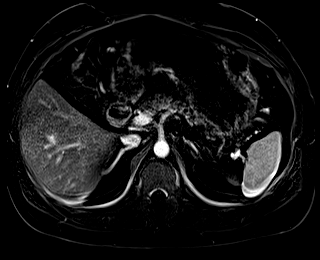
[im 80/80]
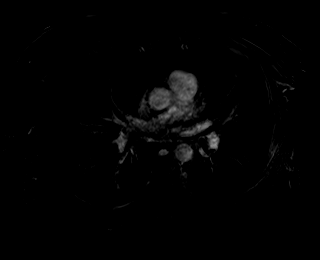

[Series 22: T1 dynamic post-contrast · coronal · 3.0mm · 1.31mm/px · 3 of 80 slices shown]
[im 1/80]
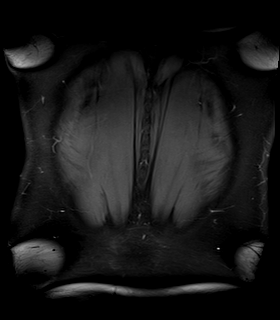
[im 40/80]
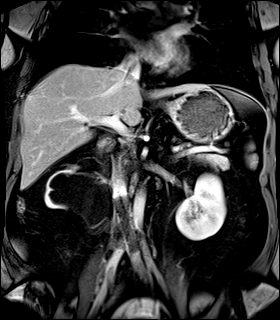
[im 80/80]
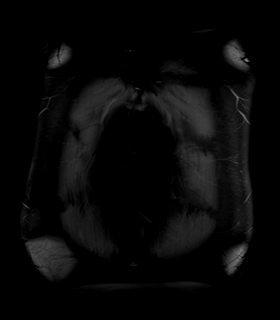

[Series 23: T1 dynamic · axial · 3.5mm · 1.19mm/px · z∈[-138,+138]mm · 3 of 80 slices shown (8 of 8)]
[im 1/80]
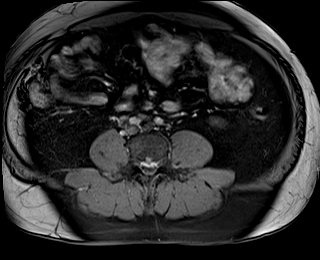
[im 40/80]
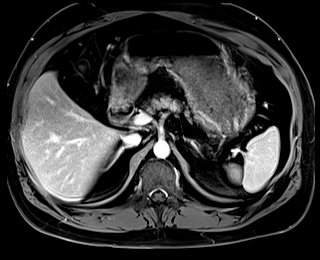
[im 80/80]
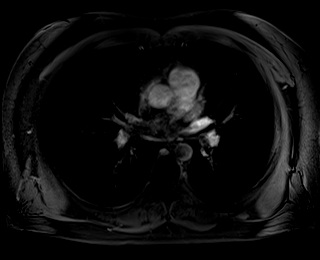

[48 of 48 positions shown; findings below may reference images not displayed]

FINDINGS: Lower chest: Unremarkable.

Hepatobiliary: No discrete cystic or solid hepatic lesions. No intra
or extrahepatic biliary ductal dilatation. Gallbladder is nearly
completely decompressed. Small filling defect in the gallbladder
measuring 4 mm in size, which appears associated with the
gallbladder wall in demonstrates some low-level enhancement on post
gadolinium images, likely to represent a gallbladder polyp.

Pancreas: No pancreatic mass. No pancreatic ductal dilatation. No
pancreatic or peripancreatic fluid collections or inflammatory
changes.

Spleen:  Unremarkable.

Adrenals/Urinary Tract: Left kidney and bilateral adrenal glands are
normal in appearance. Severe chronic atrophy of the right kidney
again noted with chronic right-sided severe hydroureteronephrosis
which terminates in the mid right ureter where the ureter is
intimately associated with several areas of signal void in the
retroperitoneum, which correspond to regions of calcification seen
on prior CTA of the abdomen and pelvis [DATE].

Stomach/Bowel: Visualized portions are unremarkable.

Vascular/Lymphatic: No aneurysm identified in the visualized
abdominal vasculature. There is signal void in the infrarenal
abdominal aorta, corresponding to the area of chronic obstruction
and calcifications seen on prior CTA of the abdomen and pelvis. The
renal veins are patent bilaterally, as is the suprarenal inferior
vena cava. Several small collateral veins are noted in the
retroperitoneum. No lymphadenopathy identified in the abdomen.

Other: No significant volume of ascites noted in the visualized
portions of the peritoneal cavity.

Musculoskeletal: No aggressive appearing osseous lesions are noted
in the visualized portions of the skeleton.
IMPRESSION: 1. Chronic obstruction of the infrarenal inferior vena cava which is
associated with a partially calcified right-sided retroperitoneal
nodal mass, better depicted on prior CTA of the abdomen and pelvis.
This is asymmetric with the contralateral side. This asymmetric
appearance would be unusual for retroperitoneal fibrosis, and is
favored to represent sequela of prior granulomatous disease. This
process encapsulates the mid right ureter which is chronically
obstructed and associated with severe chronic right
hydroureteronephrosis and severe right renal atrophy.
2. 4 mm gallbladder polyp.

## 2020-10-05 MED ORDER — GADOBUTROL 1 MMOL/ML IV SOLN
9.0000 mL | Freq: Once | INTRAVENOUS | Status: AC | PRN
  Administered 2020-10-05: 9 mL via INTRAVENOUS

## 2020-10-20 ENCOUNTER — Other Ambulatory Visit: Payer: Self-pay | Admitting: Rheumatology

## 2020-10-20 ENCOUNTER — Other Ambulatory Visit (HOSPITAL_COMMUNITY): Payer: Self-pay | Admitting: Rheumatology

## 2020-10-20 DIAGNOSIS — R1900 Intra-abdominal and pelvic swelling, mass and lump, unspecified site: Secondary | ICD-10-CM

## 2020-10-23 ENCOUNTER — Encounter (INDEPENDENT_AMBULATORY_CARE_PROVIDER_SITE_OTHER): Payer: Self-pay

## 2020-10-23 ENCOUNTER — Ambulatory Visit (INDEPENDENT_AMBULATORY_CARE_PROVIDER_SITE_OTHER): Payer: Self-pay | Admitting: Vascular Surgery

## 2020-10-27 ENCOUNTER — Other Ambulatory Visit: Payer: Self-pay

## 2020-10-27 ENCOUNTER — Ambulatory Visit
Admission: RE | Admit: 2020-10-27 | Discharge: 2020-10-27 | Disposition: A | Discharge status: Home / Self Care | Payer: Self-pay | Source: Ambulatory Visit | Attending: Rheumatology | Admitting: Rheumatology

## 2020-10-27 DIAGNOSIS — R1900 Intra-abdominal and pelvic swelling, mass and lump, unspecified site: Secondary | ICD-10-CM | POA: Insufficient documentation

## 2020-10-27 HISTORY — DX: Disorder of kidney and ureter, unspecified: N28.9

## 2020-10-27 LAB — POCT I-STAT CREATININE: Creatinine, Ser: 1.2 mg/dL (ref 0.61–1.24)

## 2020-10-27 MED ORDER — IOHEXOL 350 MG/ML SOLN
100.0000 mL | Freq: Once | INTRAVENOUS | Status: AC | PRN
  Administered 2020-10-27: 100 mL via INTRAVENOUS

## 2020-12-02 ENCOUNTER — Other Ambulatory Visit (INDEPENDENT_AMBULATORY_CARE_PROVIDER_SITE_OTHER): Payer: Self-pay | Admitting: Vascular Surgery

## 2021-03-05 ENCOUNTER — Other Ambulatory Visit: Payer: Self-pay

## 2021-03-05 MED FILL — Apixaban Tab 5 MG: ORAL | 90 days supply | Qty: 180 | Fill #0 | Status: AC

## 2021-03-08 ENCOUNTER — Other Ambulatory Visit: Payer: Self-pay

## 2021-05-06 DIAGNOSIS — R768 Other specified abnormal immunological findings in serum: Secondary | ICD-10-CM | POA: Insufficient documentation

## 2021-05-06 DIAGNOSIS — I82429 Acute embolism and thrombosis of unspecified iliac vein: Secondary | ICD-10-CM | POA: Insufficient documentation

## 2021-05-06 DIAGNOSIS — Z8619 Personal history of other infectious and parasitic diseases: Secondary | ICD-10-CM | POA: Insufficient documentation

## 2021-05-06 DIAGNOSIS — N261 Atrophy of kidney (terminal): Secondary | ICD-10-CM | POA: Insufficient documentation

## 2021-05-06 DIAGNOSIS — R1900 Intra-abdominal and pelvic swelling, mass and lump, unspecified site: Secondary | ICD-10-CM | POA: Insufficient documentation

## 2021-06-07 ENCOUNTER — Other Ambulatory Visit: Payer: Self-pay

## 2021-06-11 ENCOUNTER — Other Ambulatory Visit: Payer: Self-pay

## 2021-06-25 ENCOUNTER — Other Ambulatory Visit: Payer: Self-pay

## 2021-06-25 ENCOUNTER — Ambulatory Visit (INDEPENDENT_AMBULATORY_CARE_PROVIDER_SITE_OTHER): Payer: Self-pay

## 2021-06-25 ENCOUNTER — Ambulatory Visit (INDEPENDENT_AMBULATORY_CARE_PROVIDER_SITE_OTHER): Payer: Self-pay | Admitting: Nurse Practitioner

## 2021-06-25 ENCOUNTER — Ambulatory Visit (INDEPENDENT_AMBULATORY_CARE_PROVIDER_SITE_OTHER): Payer: Self-pay | Admitting: Vascular Surgery

## 2021-06-25 VITALS — BP 138/95 | HR 76 | Ht 71.0 in | Wt 221.0 lb

## 2021-06-25 DIAGNOSIS — I82403 Acute embolism and thrombosis of unspecified deep veins of lower extremity, bilateral: Secondary | ICD-10-CM

## 2021-06-25 DIAGNOSIS — N135 Crossing vessel and stricture of ureter without hydronephrosis: Secondary | ICD-10-CM

## 2021-06-25 MED ORDER — APIXABAN 2.5 MG PO TABS
2.5000 mg | ORAL_TABLET | Freq: Two times a day (BID) | ORAL | 11 refills | Status: AC
  Filled 2021-06-25: qty 60, 30d supply, fill #0

## 2021-06-25 NOTE — Progress Notes (Signed)
[facility]   Subjective:    Patient ID: Austin Glover, male    DOB: [birth year], 40 y.o.   MRN: [id number] Chief Complaint  Patient presents with   Follow-up    Established pt appt no show 10/23/2020. 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  Initially the patient was supposed to follow-up in December 2021 however due to other issues he was unable to follow-up until now.  He denies any lower extremity pain or swelling.  He was treated with extensive thrombectomy and bilateral iliac vein stents that extend into the IVC distally.  So far he is done well.  He notes that he has not had Eliquis for about 2 weeks.  He is not still on prednisone for his retroperitoneal fibrosis.  Today his noninvasive studies show his iliac veins and IVC to be widely patent with no evidence of thrombus or stenosis today.  He denies any swelling or discomfort to speak of.     Review of Systems  Cardiovascular:  Negative for leg swelling.  All other systems reviewed and are negative.     Objective:   Physical Exam Vitals reviewed.  HENT:     Head: Normocephalic.  Cardiovascular:     Rate and Rhythm: Normal rate.     Pulses: Normal pulses.  Pulmonary:     Effort: Pulmonary effort is normal.  Musculoskeletal:     Right lower leg: No edema.     Left lower leg: No edema.  Skin:    General: Skin is warm and dry.  Neurological:     Mental Status: He is alert and oriented to person, place, and time.  Psychiatric:        Mood and Affect: Mood normal.        Behavior: Behavior normal.        Thought Content: Thought content normal.        Judgment: Judgment normal.    BP (!) 138/95   Pulse 76   Ht 5\' 11"  (1.803 m)   Wt 221 lb (100.2 kg)   BMI 30.82 kg/m   Past Medical History:  Diagnosis Date   Renal insufficiency     Social History   Socioeconomic History    Marital status: Married    Spouse name: Not on file   Number of children: Not on file   Years of education: Not on file   Highest education level: Not on file  Occupational History   Not on file  Tobacco Use   Smoking status: Never   Smokeless tobacco: Never  Vaping Use   Vaping Use: Never used  Substance and Sexual Activity   Alcohol use: Yes    Comment: occasional   Drug use: Never   Sexual activity: Yes  Other Topics Concern   Not on file  Social History Narrative   Not on file   Social Determinants of Health   Financial Resource Strain: Not on file  Food Insecurity: Not on file  Transportation Needs: Not on file  Physical Activity: Not on file  Stress: Not on file  Social Connections: Not on file  Intimate Partner Violence: Not on file    Past Surgical History:  Procedure Laterality Date   LOWER EXTREMITY ANGIOGRAPHY Right 06/04/2020   Procedure: Lower Extremity Angiography;  Surgeon: 06/06/2020, MD;  Location: [facility];  Service: Cardiovascular;  Laterality: Right;   PERIPHERAL VASCULAR THROMBECTOMY Bilateral 06/08/2020   Procedure: PERIPHERAL VASCULAR THROMBECTOMY, BLE and IVC;  Surgeon: Annice Needy, MD;  Location: [facility];  Service: Cardiovascular;  Laterality: Bilateral;   PERIPHERAL VASCULAR THROMBECTOMY Bilateral 06/10/2020   Procedure: PERIPHERAL VASCULAR THROMBECTOMY;  Surgeon: Annice Needy, MD;  Location: [facility];  Service: Cardiovascular;  Laterality: Bilateral;   TONSILLECTOMY      History reviewed. No pertinent family history.  No Known Allergies  CBC Latest Ref Rng & Units 06/16/2020 06/15/2020 06/14/2020  WBC 4.0 - 10.5 K/uL 8.7 7.8 8.0  Hemoglobin 13.0 - 17.0 g/dL 11.2(L) 10.6(L) 10.3(L)  Hematocrit 39.0 - 52.0 % 32.5(L) 32.1(L) 31.2(L)  Platelets 150 - 400 K/uL 286 260 234      CMP     Component Value Date/Time   NA 139 06/11/2020 0513   K 3.8 06/11/2020 0513   CL 107 06/11/2020 0513   CO2 25  06/11/2020 0513   GLUCOSE 104 (H) 06/11/2020 0513   BUN 10 06/11/2020 0513   CREATININE 1.20 10/27/2020 1330   CALCIUM 7.7 (L) 06/11/2020 0513   PROT 6.3 (L) 06/06/2020 0351   ALBUMIN 3.4 (L) 06/06/2020 0351   AST 32 06/06/2020 0351   ALT 24 06/06/2020 0351   ALKPHOS 61 06/06/2020 0351   BILITOT 1.1 06/06/2020 0351   GFRNONAA >60 06/16/2020 0512   GFRAA >60 06/16/2020 0512     No results found.     Assessment & Plan:   1. Leg DVT (deep venous thromboembolism), acute, bilateral (HCC) One of the possible causes of his bilateral DVTs was thought to be his retroperitoneal fibrosis.  Unfortunately this has not been completely resolved.  Because of this I discussed with the patient the risk and possibility of DVT recurrence.  As far as options are concerned the patient could begin taking a daily baby aspirin, he could restart taking a full dosage of Eliquis at 5 mg twice a day, or 2.5 mg and twice a day for DVT prophylaxis.  Based on the patient's tolerance levels she elected to go forward 2.5 mg twice a day which will give him protection against DVT with the decrease bleeding risk as if he is taking a daily aspirin.  We will plan on seeing the patient back on an annual basis or sooner if issues arise.  2. RPF (retroperitoneal fibrosis) The patient notes that this is not quite completely resolved and he has upcoming testing at [facility].  Because this was attributed to a possible cause of his previous DVT, we will keep him on prophylactic anticoagulation as noted above.   No current outpatient medications on file prior to visit.   No current facility-administered medications on file prior to visit.    There are no Patient Instructions on file for this visit. No follow-ups on file.   Georgiana Spinner, NP

## 2021-07-04 ENCOUNTER — Encounter (INDEPENDENT_AMBULATORY_CARE_PROVIDER_SITE_OTHER): Payer: Self-pay | Admitting: Nurse Practitioner

## 2021-11-09 ENCOUNTER — Other Ambulatory Visit: Payer: Self-pay

## 2022-03-07 ENCOUNTER — Other Ambulatory Visit: Payer: Self-pay

## 2022-06-24 ENCOUNTER — Ambulatory Visit (INDEPENDENT_AMBULATORY_CARE_PROVIDER_SITE_OTHER): Payer: Self-pay | Admitting: Vascular Surgery

## 2022-07-29 ENCOUNTER — Ambulatory Visit (INDEPENDENT_AMBULATORY_CARE_PROVIDER_SITE_OTHER): Payer: Self-pay | Admitting: Vascular Surgery

## 2022-07-29 VITALS — BP 128/86 | HR 77 | Resp 16 | Wt 222.8 lb

## 2022-07-29 DIAGNOSIS — N135 Crossing vessel and stricture of ureter without hydronephrosis: Secondary | ICD-10-CM

## 2022-07-29 DIAGNOSIS — I82423 Acute embolism and thrombosis of iliac vein, bilateral: Secondary | ICD-10-CM

## 2022-07-29 NOTE — Assessment & Plan Note (Signed)
Had previous thrombectomy and stent placement over 2 years ago.  Was on anticoagulation for well over a year and its not entirely clear when this stopped.  I recommend he take an 81 mg aspirin daily for prophylaxis.  We will continue to see him on an annual basis.

## 2022-07-29 NOTE — Progress Notes (Signed)
MRN : [id number]  Austin Glover is a 41 y.o. ([birth year]) male who presents with chief complaint of  Chief Complaint  Patient presents with   Follow-up    1 yr follow up  .  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  This was treated over 2 years ago with what essentially became a double barrel shotgun of stents up into the IVC from both iliac veins.  He no longer has leg swelling and his leg swelling was massive prior to this.  He was seen by 2 facilities for the retroperitoneal fibrosis and he still has this.  He is no longer been on blood thinners.  This appears to have stopped at some point since his last visit.  He is in his usual state of health today without complaints  Current Outpatient Medications  Medication Sig Dispense Refill   apixaban (ELIQUIS) 2.5 MG TABS tablet Take 1 tablet (2.5 mg total) by mouth 2 (two) times daily. (Patient not taking: Reported on 07/29/2022) 60 tablet 11   No current facility-administered medications for this visit.    Past Medical History:  Diagnosis Date   Renal insufficiency     Past Surgical History:  Procedure Laterality Date   LOWER EXTREMITY ANGIOGRAPHY Right 06/04/2020   Procedure: Lower Extremity Angiography;  Surgeon: Annice Needy, MD;  Location: [facility];  Service: Cardiovascular;  Laterality: Right;   PERIPHERAL VASCULAR THROMBECTOMY Bilateral 06/08/2020   Procedure: PERIPHERAL VASCULAR THROMBECTOMY, BLE and IVC;  Surgeon: Annice Needy, MD;  Location: [facility];  Service: Cardiovascular;  Laterality: Bilateral;   PERIPHERAL VASCULAR THROMBECTOMY Bilateral 06/10/2020   Procedure: PERIPHERAL VASCULAR THROMBECTOMY;  Surgeon: Annice Needy, MD;  Location: [facility];  Service: Cardiovascular;  Laterality: Bilateral;   TONSILLECTOMY       Social History   Tobacco Use   Smoking status: Never   Smokeless  tobacco: Never  Vaping Use   Vaping Use: Never used  Substance Use Topics   Alcohol use: Yes    Comment: occasional   Drug use: Never       No family history on file.   No Known Allergies  REVIEW OF SYSTEMS (Negative unless checked)   Constitutional: [] Weight loss  [] Fever  [] Chills Cardiac: [] Chest pain   [] Chest pressure   [] Palpitations   [] Shortness of breath when laying flat   [] Shortness of breath at rest   [] Shortness of breath with exertion. Vascular:  [] Pain in legs with walking   [] Pain in legs at rest   [] Pain in legs when laying flat   [] Claudication   [] Pain in feet when walking  [] Pain in feet at rest  [] Pain in feet when laying flat   [x] History of DVT   [x] Phlebitis   [x] Swelling in legs   [] Varicose veins   [] Non-healing ulcers Pulmonary:   [] Uses home oxygen   [] Productive cough   [] Hemoptysis   [] Wheeze  [] COPD   [] Asthma Neurologic:  [] Dizziness  [] Blackouts   [] Seizures   [] History of stroke   [] History of TIA  [] Aphasia   [] Temporary blindness   [] Dysphagia   [] Weakness or numbness in arms   [] Weakness or numbness in legs Musculoskeletal:  [] Arthritis   [] Joint swelling   [] Joint pain   [x] Low back pain Hematologic:  [] Easy bruising  [] Easy bleeding   [] Hypercoagulable state   [] Anemic   Gastrointestinal:  []   Blood in stool   [] Vomiting blood  [] Gastroesophageal reflux/heartburn   [] Abdominal pain Genitourinary:  [] Chronic kidney disease   [] Difficult urination  [] Frequent urination  [] Burning with urination   [] Hematuria Skin:  [] Rashes   [] Ulcers   [] Wounds Psychological:  [] History of anxiety   []  History of major depression.  Physical Examination  BP 128/86 (BP Location: Right Arm)   Pulse 77   Resp 16   Wt 222 lb 12.8 oz (101.1 kg)   BMI 31.07 kg/m  Gen:  WD/WN, NAD Head: [place]/AT, No temporalis wasting. Ear/Nose/Throat: Hearing grossly intact, nares w/o erythema or drainage Eyes: Conjunctiva clear. Sclera non-icteric Neck: Supple.  Trachea  midline Pulmonary:  Good air movement, no use of accessory muscles.  Cardiac: RRR, no JVD Vascular:  Vessel Right Left  Radial Palpable Palpable                          PT Palpable Palpable  DP Palpable Palpable   Gastrointestinal: soft, non-tender/non-distended. No guarding/reflex.  Musculoskeletal: M/S 5/5 throughout.  No deformity or atrophy.  No significant lower extremity edema. Neurologic: Sensation grossly intact in extremities.  Symmetrical.  Speech is fluent.  Psychiatric: Judgment intact, Mood & affect appropriate for pt's clinical situation. Dermatologic: No rashes or ulcers noted.  No cellulitis or open wounds.      Labs No results found for this or any previous visit (from the past 2160 hour(s)).  Radiology No results found.  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  Was on anticoagulation for well over a year and its not entirely clear when this stopped.  I recommend he take an 81 mg aspirin daily for prophylaxis.  We will continue to see him on an annual basis.  RPF (retroperitoneal fibrosis) Has seen rheumatology here and at [facility].  Was told a biopsy was too risky.  Not currently getting any treatment for this.    , MD  07/29/2022 11:09 AM    This note was created with Dragon medical transcription system.  Any errors from dictation are purely unintentional

## 2022-07-29 NOTE — Assessment & Plan Note (Signed)
Has seen rheumatology here and at [facility].  Was told a biopsy was too risky.  Not currently getting any treatment for this.

## 2023-08-01 ENCOUNTER — Ambulatory Visit (INDEPENDENT_AMBULATORY_CARE_PROVIDER_SITE_OTHER): Payer: Self-pay | Admitting: Nurse Practitioner
